# Patient Record
Sex: Female | Born: 1955 | Race: White | Hispanic: No | Marital: Married | Smoking: Former smoker
Health system: Southern US, Community
[De-identification: ages and names within clinical notes are randomized; demographics above are authoritative.]

## PROBLEM LIST (undated history)

## (undated) DIAGNOSIS — M2550 Pain in unspecified joint: Secondary | ICD-10-CM

## (undated) DIAGNOSIS — Q2112 Patent foramen ovale: Secondary | ICD-10-CM

## (undated) DIAGNOSIS — M8580 Other specified disorders of bone density and structure, unspecified site: Secondary | ICD-10-CM

## (undated) DIAGNOSIS — I10 Essential (primary) hypertension: Secondary | ICD-10-CM

## (undated) DIAGNOSIS — R000 Tachycardia, unspecified: Secondary | ICD-10-CM

## (undated) DIAGNOSIS — E785 Hyperlipidemia, unspecified: Secondary | ICD-10-CM

## (undated) DIAGNOSIS — R351 Nocturia: Secondary | ICD-10-CM

## (undated) DIAGNOSIS — I2510 Atherosclerotic heart disease of native coronary artery without angina pectoris: Secondary | ICD-10-CM

## (undated) DIAGNOSIS — M1990 Unspecified osteoarthritis, unspecified site: Secondary | ICD-10-CM

## (undated) DIAGNOSIS — J189 Pneumonia, unspecified organism: Secondary | ICD-10-CM

## (undated) DIAGNOSIS — Q211 Atrial septal defect: Secondary | ICD-10-CM

## (undated) DIAGNOSIS — F419 Anxiety disorder, unspecified: Secondary | ICD-10-CM

## (undated) DIAGNOSIS — Z8709 Personal history of other diseases of the respiratory system: Secondary | ICD-10-CM

## (undated) HISTORY — DX: Hyperlipidemia, unspecified: E78.5

## (undated) HISTORY — PX: BACK SURGERY: SHX140

## (undated) HISTORY — DX: Tachycardia, unspecified: R00.0

## (undated) HISTORY — DX: Atherosclerotic heart disease of native coronary artery without angina pectoris: I25.10

## (undated) HISTORY — PX: COLONOSCOPY: SHX174

## (undated) HISTORY — DX: Atrial septal defect: Q21.1

## (undated) HISTORY — DX: Other specified disorders of bone density and structure, unspecified site: M85.80

## (undated) HISTORY — DX: Patent foramen ovale: Q21.12

## (undated) HISTORY — DX: Anxiety disorder, unspecified: F41.9

---

## 1998-04-19 ENCOUNTER — Inpatient Hospital Stay: Admission: RE | Admit: 1998-04-19 | Discharge: 1998-04-22 | Payer: Self-pay | Admitting: Thoracic Surgery

## 2001-11-30 HISTORY — PX: LUNG SURGERY: SHX703

## 2002-10-31 ENCOUNTER — Encounter: Admission: RE | Admit: 2002-10-31 | Discharge: 2002-10-31 | Payer: Self-pay | Admitting: Orthopedic Surgery

## 2002-10-31 ENCOUNTER — Encounter: Payer: Self-pay | Admitting: Orthopedic Surgery

## 2002-12-11 ENCOUNTER — Encounter: Admission: RE | Admit: 2002-12-11 | Discharge: 2002-12-11 | Payer: Self-pay | Admitting: Orthopedic Surgery

## 2002-12-11 ENCOUNTER — Encounter: Payer: Self-pay | Admitting: Orthopedic Surgery

## 2003-12-31 ENCOUNTER — Other Ambulatory Visit: Admission: RE | Admit: 2003-12-31 | Discharge: 2003-12-31 | Payer: Self-pay | Admitting: *Deleted

## 2004-02-12 ENCOUNTER — Encounter: Admission: RE | Admit: 2004-02-12 | Discharge: 2004-05-12 | Payer: Self-pay | Admitting: *Deleted

## 2005-09-18 ENCOUNTER — Ambulatory Visit (HOSPITAL_COMMUNITY): Admission: RE | Admit: 2005-09-18 | Discharge: 2005-09-19 | Payer: Self-pay | Admitting: Orthopaedic Surgery

## 2008-06-22 ENCOUNTER — Other Ambulatory Visit: Admission: RE | Admit: 2008-06-22 | Discharge: 2008-06-22 | Payer: Self-pay | Admitting: Family Medicine

## 2011-04-17 NOTE — Op Note (Signed)
Brandy Meyer, Brandy Meyer                   ACCOUNT NO.:  [id number]   MEDICAL RECORD NO.:  [id number]          PATIENT TYPE:  OIB   LOCATION:  5007                         FACILITY:  [facility]   PHYSICIAN:  Sharolyn Douglas, M.D.        DATE OF BIRTH:  09-Jul-1956   DATE OF PROCEDURE:  09/18/2005  DATE OF DISCHARGE:  09/19/2005                                 OPERATIVE REPORT   DIAGNOSIS:  C6-7 herniated nucleus pulposus and foraminal narrowing with  severe left upper extremity radiculopathy.   PROCEDURE:  1.  Anterior cervical diskectomy, C6-7.  2.  Anterior cervical arthrodesis, C6-7, with placement of allograft      prosthesis spacer packed with local autogenous bone graft.  3.  Anterior cervical plating, C6-7, using the Abbott Spine System.   SURGEON:  Sharolyn Douglas, M.D.   ASSISTANT:  Verlin Fester, P.A.   ANESTHESIA:  General endotracheal.   COMPLICATIONS:  None.   ESTIMATED BLOOD LOSS:  Minimal.   INDICATIONS:  The patient is a pleasant female with severe left upper  extremity radiculopathy felt to be secondary to C6-7 foraminal disk  herniation with underlying spondylosis and foraminal narrowing.  Because of  the severe nature of her pain unresponsive to conservative management, she  has elected undergo ACDF at C6-7 in hopes of improving her symptoms, risk  and benefits reviewed.   PROCEDURE:  The patient was identified in the holding area and taken to the  operating room where she underwent general orotracheal anesthesia without  difficulty, given prophylactic IV antibiotics.  Carefully positioned with  her neck in slight extension using the Mayfield head rest, 5 pounds of  halter traction applied, heck was prepped and draped usual sterile fashion.  Small transverse incision was made at level of the cricoid cartilage on the  left side.  Dissection was carried sharply through platysma.  The interval  between the SCM and strap muscles medially was developed down to the  prevertebral  space.  The esophagus, trachea and carotid sheath were examined  and protected at all times.  Intraoperative x-ray was taken to confirm  level.  The C6-7 space was easily identifiable by the large anterior  osteophyte.  Caspar distraction pins were placed in the C6 and C7 vertebral  bodies.  The osteophyte was taken down using the high-speed bur.  The disk  space was then entered and a radical diskectomy carried back to the  posterior longitudinal ligament.  The disk itself was very degenerative.  The uncovertebral joints were removed using the high-speed bur along with  the Kerrison punches.  The posterior longitudinal ligament was removed.  We  felt we had a good decompression of the spinal canal and foramen  bilaterally.  We then placed a 7-mm allograft prosthesis spacer which had  been packed with local bone graft obtained from the drill shavings; this was  countersunk into the interspace 1 mm.  A 24-mm Abbott Spine anterior  cervical plate was then placed with  four 12-mm locking screws.  We had  excellent screw purchase.  The bone  quality was good.  The wound was  irrigated and intraoperative x-ray taken, confirming appropriate levels and  placement of the instrumentation.  Deep Penrose drain was left in place.  Hemostasis was achieved.  The platysma was closed with an interrupted 2-0  Vicryl, subcutaneous layer closed with interrupted 3-0 Vicryl followed by a  running 4-0 subcuticular Vicryl suture on the skin edges, Benzoin and Steri-  Strips placed, sterile dressing applied, soft collar placed.  The patient  was extubated without difficulty and transferred to Recovery in stable  condition, able to move her upper and lower extremities.      Sharolyn Douglas, M.D.  Electronically Signed     MC/MEDQ  D:  09/20/2005  T:  09/20/2005  Job:  [id number]

## 2013-10-14 ENCOUNTER — Other Ambulatory Visit: Payer: Self-pay | Admitting: Interventional Cardiology

## 2013-10-18 ENCOUNTER — Other Ambulatory Visit: Payer: Self-pay | Admitting: Interventional Cardiology

## 2013-10-21 ENCOUNTER — Other Ambulatory Visit: Payer: Self-pay | Admitting: Interventional Cardiology

## 2013-10-27 ENCOUNTER — Telehealth: Payer: Self-pay

## 2013-10-27 NOTE — Telephone Encounter (Signed)
patient called about refill on metoprolol it was sent on 11.24.14, I called the pharm and they stated that they did not have it so I gave verbal order

## 2014-04-25 ENCOUNTER — Other Ambulatory Visit: Payer: Self-pay | Admitting: Orthopedic Surgery

## 2014-04-25 DIAGNOSIS — M545 Low back pain, unspecified: Secondary | ICD-10-CM

## 2014-05-04 ENCOUNTER — Ambulatory Visit
Admission: RE | Admit: 2014-05-04 | Discharge: 2014-05-04 | Disposition: A | Discharge status: Home / Self Care | Payer: Federal, State, Local not specified - PPO | Source: Ambulatory Visit | Attending: Orthopedic Surgery | Admitting: Orthopedic Surgery

## 2014-05-04 DIAGNOSIS — M545 Low back pain, unspecified: Secondary | ICD-10-CM

## 2014-05-14 ENCOUNTER — Other Ambulatory Visit: Payer: Self-pay | Admitting: Physical Medicine and Rehabilitation

## 2014-05-14 DIAGNOSIS — M545 Low back pain, unspecified: Secondary | ICD-10-CM

## 2014-05-17 ENCOUNTER — Ambulatory Visit
Admission: RE | Admit: 2014-05-17 | Discharge: 2014-05-17 | Disposition: A | Discharge status: Home / Self Care | Payer: Federal, State, Local not specified - PPO | Source: Ambulatory Visit | Attending: Physical Medicine and Rehabilitation | Admitting: Physical Medicine and Rehabilitation

## 2014-05-17 VITALS — BP 136/81 | HR 104

## 2014-05-17 DIAGNOSIS — M545 Low back pain, unspecified: Secondary | ICD-10-CM

## 2014-05-17 MED ORDER — DIAZEPAM 5 MG PO TABS
10.0000 mg | ORAL_TABLET | Freq: Once | ORAL | Status: AC
  Administered 2014-05-17: 10 mg via ORAL

## 2014-05-17 MED ORDER — IOHEXOL 180 MG/ML  SOLN
1.0000 mL | Freq: Once | INTRAMUSCULAR | Status: AC | PRN
  Administered 2014-05-17: 1 mL via EPIDURAL

## 2014-05-17 MED ORDER — METHYLPREDNISOLONE ACETATE 40 MG/ML INJ SUSP (RADIOLOG
120.0000 mg | Freq: Once | INTRAMUSCULAR | Status: AC
  Administered 2014-05-17: 120 mg via EPIDURAL

## 2014-05-17 NOTE — Discharge Instructions (Signed)

## 2014-06-18 ENCOUNTER — Other Ambulatory Visit: Payer: Self-pay | Admitting: Neurological Surgery

## 2014-06-18 DIAGNOSIS — M48061 Spinal stenosis, lumbar region without neurogenic claudication: Secondary | ICD-10-CM

## 2014-06-19 ENCOUNTER — Ambulatory Visit
Admission: RE | Admit: 2014-06-19 | Discharge: 2014-06-19 | Disposition: A | Discharge status: Home / Self Care | Payer: Federal, State, Local not specified - PPO | Source: Ambulatory Visit | Attending: Neurological Surgery | Admitting: Neurological Surgery

## 2014-06-19 VITALS — BP 142/83 | HR 114

## 2014-06-19 DIAGNOSIS — M48061 Spinal stenosis, lumbar region without neurogenic claudication: Secondary | ICD-10-CM

## 2014-06-19 MED ORDER — IOHEXOL 180 MG/ML  SOLN
1.0000 mL | Freq: Once | INTRAMUSCULAR | Status: AC | PRN
  Administered 2014-06-19: 1 mL via EPIDURAL

## 2014-06-19 MED ORDER — METHYLPREDNISOLONE ACETATE 40 MG/ML INJ SUSP (RADIOLOG
120.0000 mg | Freq: Once | INTRAMUSCULAR | Status: AC
  Administered 2014-06-19: 120 mg via EPIDURAL

## 2014-09-04 ENCOUNTER — Encounter: Payer: Self-pay | Admitting: Cardiology

## 2014-09-04 ENCOUNTER — Encounter: Payer: Self-pay | Admitting: Interventional Cardiology

## 2014-09-04 ENCOUNTER — Ambulatory Visit (INDEPENDENT_AMBULATORY_CARE_PROVIDER_SITE_OTHER): Payer: Federal, State, Local not specified - PPO | Admitting: Interventional Cardiology

## 2014-09-04 VITALS — BP 124/90 | HR 91 | Ht 61.0 in | Wt 133.8 lb

## 2014-09-04 DIAGNOSIS — R000 Tachycardia, unspecified: Secondary | ICD-10-CM

## 2014-09-04 DIAGNOSIS — E785 Hyperlipidemia, unspecified: Secondary | ICD-10-CM | POA: Insufficient documentation

## 2014-09-04 DIAGNOSIS — I471 Supraventricular tachycardia: Secondary | ICD-10-CM

## 2014-09-04 DIAGNOSIS — E782 Mixed hyperlipidemia: Secondary | ICD-10-CM

## 2014-09-04 NOTE — Patient Instructions (Signed)
Your physician recommends that you return for a FASTING lipid and cmet on  Your physician recommends that you continue on your current medications as directed. Please refer to the Current Medication list given to you today.  Your physician wants you to follow-up in: 1 year with Dr. Eldridge DaceVaranasi. You will receive a reminder letter in the mail two months in advance. If you don't receive a letter, please call our office to schedule the follow-up appointment.

## 2014-09-04 NOTE — Progress Notes (Signed)
Patient ID: Brandy Meyer Probus, female   DOB: [birth year], 58 y.o.   MRN: [id number]    [address] [place], [place]  [address] Phone: [phone] Fax:  [phone]  Date:  09/04/2014   ID:  Brandy Meyer [place], DOB [birth year], MRN [id number]  PCP:  No primary provider on file.      History of Present Illness: Brandy Meyer Iodice is a 58 y.o. female who ahd a negaitve cardiac w/u in 2011. SHe had a Holter monitor placed and her average HR was > 100. SHe was placed on a beta blocker several years ago. LV function was normal in the past.   She has been walking some and has had no problems.  BP is high at [employer] but she does not rest before checking.  She had back surgery, no cardiac or BP issues during that time.  She had a cyst removed.      Wt Readings from Last 3 Encounters:  09/04/14 133 lb 12.8 oz (60.691 kg)     Past Medical History  Diagnosis Date  . Hyperlipidemia   . Anxiety   . Osteopenia     by [facility] on January 30, 2009  . H/O exercise stress test     ETT, without ischemia, echocardiogram stable   . Sinus tachycardia     Current Outpatient Prescriptions  Medication Sig Dispense Refill  . atorvastatin (LIPITOR) 40 MG tablet Take 60 mg by mouth daily at 6 PM.       . Cholecalciferol (VITAMIN D-3) 5000 UNITS TABS Take 1 tablet by mouth daily.      Marland Kitchen. EPINEPHrine 0.3 mg/0.3 mL IJ SOAJ injection Inject 0.3 mg into the muscle as needed.      . metoprolol succinate (TOPROL-XL) 25 MG 24 hr tablet 1 TABLET ONCE A DAY ORALLY 30 DAYS  30 tablet  10  . oxyCODONE-acetaminophen (PERCOCET/ROXICET) 5-325 MG per tablet Take 1 tablet by mouth every 8 (eight) hours as needed for severe pain.        No current facility-administered medications for this visit.    Allergies:    Allergies  Allergen Reactions  . Shellfish Allergy Hives, Shortness Of Breath and Swelling    Crab meat and lobster.  Can tolerate small doses of shrimp.   . Effexor [Venlafaxine] Other (See  Comments)    Sleepy/hyper  . Penicillins     Had as a child; doesn't remember    Social History:  The patient  reports that she quit smoking about 6 years ago. She does not have any smokeless tobacco history on file. She reports that she drinks alcohol. She reports that she does not use illicit drugs.   Family History:  The patient's family history includes Alcoholism in her father; Heart attack in her mother; Lung cancer in her father.   ROS:  Please see the history of present illness.  No nausea, vomiting.  No fevers, chills.  No focal weakness.  No dysuria.    All other systems reviewed and negative.   PHYSICAL EXAM: VS:  BP 124/90  Pulse 91  Ht 5\' 1"  (1.549 m)  Wt 133 lb 12.8 oz (60.691 kg)  BMI 25.29 kg/m2 Well nourished, well developed, in no acute distress HEENT: normal Neck: no JVD, no carotid bruits Cardiac:  normal S1, S2; RRR;  Lungs:  clear to auscultation bilaterally, no wheezing, rhonchi or rales Abd: soft, nontender, no hepatomegaly Ext: no edema Skin: warm and dry Neuro:  no focal abnormalities noted  EKG:  Normal   ASSESSMENT AND PLAN:  Sinus Tachycardia  Continue Toprol XL Tablet Extended Release 24 Hour, 25 MG, 1 tablet, Orally, Once a day Notes: No palpitations. HR better on exam. HR was averaing 104 on the last Holter. Check HR at home or at drug store.  Explained risk of uncontrolled tachycardia causing cardiomyopathy. She is feeling better.  Pulse rate, (heart rate) should be < 100.    2. Hyperlipemia, mixed  Continue Atorvastatin Calcium Tablet, 40 MG, 1 1/2 tabs (60 MG total), Orally, Once a day Notes: TG 305, LDL 124, both have decreased on the 5/14 check. No recent lab work.  Will check lipids and liver function tests. Will forward to Dr. Zachery Dauer.   May need lipitor 80. Consider fish oil if TG stay elevated.    Follow Up  1 Year (Reason: tachycardia)     Signed, Fredric Mare, MD, [facility] 09/04/2014 3:01 PM

## 2014-09-05 ENCOUNTER — Other Ambulatory Visit (INDEPENDENT_AMBULATORY_CARE_PROVIDER_SITE_OTHER): Payer: Federal, State, Local not specified - PPO | Admitting: *Deleted

## 2014-09-05 DIAGNOSIS — E782 Mixed hyperlipidemia: Secondary | ICD-10-CM

## 2014-09-05 LAB — LDL CHOLESTEROL, DIRECT: Direct LDL: 127 mg/dL

## 2014-09-05 LAB — COMPREHENSIVE METABOLIC PANEL
ALK PHOS: 67 U/L (ref 39–117)
ALT: 19 U/L (ref 0–35)
AST: 12 U/L (ref 0–37)
Albumin: 3.6 g/dL (ref 3.5–5.2)
BILIRUBIN TOTAL: 1.2 mg/dL (ref 0.2–1.2)
BUN: 9 mg/dL (ref 6–23)
CO2: 28 mEq/L (ref 19–32)
Calcium: 9 mg/dL (ref 8.4–10.5)
Chloride: 102 mEq/L (ref 96–112)
Creatinine, Ser: 0.6 mg/dL (ref 0.4–1.2)
GFR: 104.91 mL/min (ref >60.00)
GLUCOSE: 95 mg/dL (ref 70–99)
Potassium: 4.3 mEq/L (ref 3.5–5.1)
SODIUM: 137 meq/L (ref 135–145)
Total Protein: 7.6 g/dL (ref 6.0–8.3)

## 2014-09-05 LAB — LIPID PANEL
CHOL/HDL RATIO: 5
CHOLESTEROL: 205 mg/dL — AB (ref 0–200)
HDL: 41.2 mg/dL (ref >39.00)
NonHDL: 163.8
Triglycerides: 244 mg/dL — ABNORMAL HIGH (ref 0.0–149.0)
VLDL: 48.8 mg/dL — AB (ref 0.0–40.0)

## 2014-10-18 ENCOUNTER — Encounter: Payer: Self-pay | Admitting: *Deleted

## 2014-10-22 ENCOUNTER — Other Ambulatory Visit: Payer: Self-pay | Admitting: Interventional Cardiology

## 2015-08-15 ENCOUNTER — Other Ambulatory Visit: Payer: Self-pay | Admitting: Family Medicine

## 2015-08-15 ENCOUNTER — Other Ambulatory Visit (HOSPITAL_COMMUNITY)
Admission: RE | Admit: 2015-08-15 | Discharge: 2015-08-15 | Disposition: A | Discharge status: Home / Self Care | Payer: Federal, State, Local not specified - PPO | Source: Ambulatory Visit | Attending: Family Medicine | Admitting: Family Medicine

## 2015-08-15 DIAGNOSIS — Z124 Encounter for screening for malignant neoplasm of cervix: Secondary | ICD-10-CM | POA: Diagnosis present

## 2015-08-16 LAB — CYTOLOGY - PAP

## 2015-09-16 ENCOUNTER — Other Ambulatory Visit: Payer: Self-pay | Admitting: Neurological Surgery

## 2015-09-16 DIAGNOSIS — M5416 Radiculopathy, lumbar region: Secondary | ICD-10-CM

## 2015-09-20 ENCOUNTER — Ambulatory Visit
Admission: RE | Admit: 2015-09-20 | Discharge: 2015-09-20 | Disposition: A | Discharge status: Home / Self Care | Payer: Federal, State, Local not specified - PPO | Source: Ambulatory Visit | Attending: Neurological Surgery | Admitting: Neurological Surgery

## 2015-09-20 DIAGNOSIS — M5416 Radiculopathy, lumbar region: Secondary | ICD-10-CM

## 2015-09-20 MED ORDER — GADOBENATE DIMEGLUMINE 529 MG/ML IV SOLN: 12.0000 mL | Freq: Once | INTRAVENOUS | Status: DC | PRN

## 2015-09-23 ENCOUNTER — Other Ambulatory Visit (HOSPITAL_COMMUNITY): Payer: Self-pay | Admitting: Neurological Surgery

## 2015-09-24 ENCOUNTER — Other Ambulatory Visit: Payer: Self-pay | Admitting: Neurological Surgery

## 2015-09-24 DIAGNOSIS — M5416 Radiculopathy, lumbar region: Secondary | ICD-10-CM

## 2015-09-27 ENCOUNTER — Ambulatory Visit
Admission: RE | Admit: 2015-09-27 | Discharge: 2015-09-27 | Disposition: A | Discharge status: Home / Self Care | Payer: Federal, State, Local not specified - PPO | Source: Ambulatory Visit | Attending: Neurological Surgery | Admitting: Neurological Surgery

## 2015-09-27 DIAGNOSIS — M5416 Radiculopathy, lumbar region: Secondary | ICD-10-CM

## 2015-09-27 MED ORDER — METHYLPREDNISOLONE ACETATE 40 MG/ML INJ SUSP (RADIOLOG
120.0000 mg | Freq: Once | INTRAMUSCULAR | Status: AC
  Administered 2015-09-27: 120 mg via EPIDURAL

## 2015-09-27 MED ORDER — IOHEXOL 180 MG/ML  SOLN
1.0000 mL | Freq: Once | INTRAMUSCULAR | Status: DC | PRN
  Administered 2015-09-27: 1 mL via EPIDURAL

## 2015-09-27 NOTE — Discharge Instructions (Signed)

## 2015-10-07 ENCOUNTER — Encounter (HOSPITAL_COMMUNITY)
Admission: RE | Admit: 2015-10-07 | Discharge: 2015-10-07 | Disposition: A | Discharge status: Home / Self Care | Payer: Federal, State, Local not specified - PPO | Source: Ambulatory Visit | Attending: Neurological Surgery | Admitting: Neurological Surgery

## 2015-10-07 ENCOUNTER — Ambulatory Visit (HOSPITAL_COMMUNITY)
Admission: RE | Admit: 2015-10-07 | Discharge: 2015-10-07 | Disposition: A | Discharge status: Home / Self Care | Payer: Federal, State, Local not specified - PPO | Source: Ambulatory Visit | Attending: Neurological Surgery | Admitting: Neurological Surgery

## 2015-10-07 ENCOUNTER — Encounter (HOSPITAL_COMMUNITY): Payer: Self-pay

## 2015-10-07 DIAGNOSIS — I10 Essential (primary) hypertension: Secondary | ICD-10-CM | POA: Insufficient documentation

## 2015-10-07 DIAGNOSIS — Z9889 Other specified postprocedural states: Secondary | ICD-10-CM | POA: Insufficient documentation

## 2015-10-07 DIAGNOSIS — Z01818 Encounter for other preprocedural examination: Secondary | ICD-10-CM | POA: Diagnosis present

## 2015-10-07 DIAGNOSIS — F419 Anxiety disorder, unspecified: Secondary | ICD-10-CM | POA: Insufficient documentation

## 2015-10-07 DIAGNOSIS — Z87891 Personal history of nicotine dependence: Secondary | ICD-10-CM | POA: Diagnosis not present

## 2015-10-07 DIAGNOSIS — E785 Hyperlipidemia, unspecified: Secondary | ICD-10-CM | POA: Insufficient documentation

## 2015-10-07 DIAGNOSIS — Z01812 Encounter for preprocedural laboratory examination: Secondary | ICD-10-CM | POA: Insufficient documentation

## 2015-10-07 DIAGNOSIS — M48061 Spinal stenosis, lumbar region without neurogenic claudication: Secondary | ICD-10-CM

## 2015-10-07 DIAGNOSIS — Z01810 Encounter for preprocedural cardiovascular examination: Secondary | ICD-10-CM | POA: Diagnosis not present

## 2015-10-07 HISTORY — DX: Essential (primary) hypertension: I10

## 2015-10-07 LAB — BASIC METABOLIC PANEL
ANION GAP: 11 (ref 5–15)
BUN: 11 mg/dL (ref 6–20)
CO2: 24 mmol/L (ref 22–32)
Calcium: 9 mg/dL (ref 8.9–10.3)
Chloride: 99 mmol/L — ABNORMAL LOW (ref 101–111)
Creatinine, Ser: 0.57 mg/dL (ref 0.44–1.00)
GFR calc Af Amer: >60 mL/min (ref >60)
GFR calc non Af Amer: >60 mL/min (ref >60)
GLUCOSE: 101 mg/dL — AB (ref 65–99)
POTASSIUM: 4.5 mmol/L (ref 3.5–5.1)
Sodium: 134 mmol/L — ABNORMAL LOW (ref 135–145)

## 2015-10-07 LAB — CBC WITH DIFFERENTIAL/PLATELET
BASOS ABS: 0 10*3/uL (ref 0.0–0.1)
Basophils Relative: 0 %
Eosinophils Absolute: 0.1 10*3/uL (ref 0.0–0.7)
Eosinophils Relative: 1 %
HEMATOCRIT: 40.4 % (ref 36.0–46.0)
Hemoglobin: 13.7 g/dL (ref 12.0–15.0)
LYMPHS PCT: 27 %
Lymphs Abs: 2.3 10*3/uL (ref 0.7–4.0)
MCH: 29.6 pg (ref 26.0–34.0)
MCHC: 33.9 g/dL (ref 30.0–36.0)
MCV: 87.3 fL (ref 78.0–100.0)
MONO ABS: 0.6 10*3/uL (ref 0.1–1.0)
MONOS PCT: 7 %
NEUTROS ABS: 5.7 10*3/uL (ref 1.7–7.7)
Neutrophils Relative %: 65 %
Platelets: 405 10*3/uL — ABNORMAL HIGH (ref 150–400)
RBC: 4.63 MIL/uL (ref 3.87–5.11)
RDW: 13 % (ref 11.5–15.5)
WBC: 8.8 10*3/uL (ref 4.0–10.5)

## 2015-10-07 LAB — PROTIME-INR
INR: 0.97 (ref 0.00–1.49)
Prothrombin Time: 13.1 seconds (ref 11.6–15.2)

## 2015-10-07 LAB — ABO/RH: ABO/RH(D): A POS

## 2015-10-07 LAB — SURGICAL PCR SCREEN
MRSA, PCR: NEGATIVE
Staphylococcus aureus: POSITIVE — AB

## 2015-10-07 LAB — TYPE AND SCREEN
ABO/RH(D): A POS
Antibody Screen: NEGATIVE

## 2015-10-07 NOTE — Progress Notes (Addendum)
PCP: Dr. Juluis RainierElizabeth  [facility] on [address]  Cardiologist: Dr. Earley AbideVarinesse: last visit 08/2014, denies any chest pain, or sob.  Mupirocin Ointment called to [facility] [place],

## 2015-10-07 NOTE — Pre-Procedure Instructions (Signed)
    Brandy FloorLisa A Pence  10/07/2015      [facility] - [place], [place] - [address] [place] [address] Phone: [phone] Fax: [phone]    Your procedure is scheduled on Friday, Nov. 11  [facility] Admitting at 7:30 A.M.  Call this number if you have problems the morning of surgery:  [phone]              Any questions prior to surgery call [phone], Monday-Friday 8 AM-4PM   Remember:  Do not eat food or drink liquids after midnight Thursday Nov. 10  Take these medicines the morning of surgery with A SIP OF WATER:oxycodone if needed   Do not wear jewelry, make-up or nail polish.  Do not wear lotions, powders, or perfumes.  You may wear deodorant.  Do not shave 48 hours prior to surgery.  Men may shave face and neck.  Do not bring valuables to the hospital.  [facility] is not responsible for any belongings or valuables.  Contacts, dentures or bridgework may not be worn into surgery.  Leave your suitcase in the car.  After surgery it may be brought to your room.  For patients admitted to the hospital, discharge time will be determined by your treatment team.  Patients discharged the day of surgery will not be allowed to drive home.   Name and phone number of your driver:    Special instructions:   Review preparing for surgery  Please read over the following fact sheets that you were given. Pain Booklet, Coughing and Deep Breathing, Blood Transfusion Information, MRSA Information and Surgical Site Infection Prevention

## 2015-10-08 NOTE — Progress Notes (Signed)
Anesthesia Chart Review: Patient is a 59 year old female scheduled for extraforaminal decompression L4-5 left, PLIF, L4-5 on 10/11/15 by Dr. Yetta BarreJones.  History includes former smoker, HLD, anxiety, HTN, sinus tachycardia, back surgery '15, lung surgery (for benign nodule) '03, C6-7 ACDF '06. PCP is Dr. Juluis RainierElizabeth Barnes. Cardiologist is Dr. Eldridge DaceVaranasi, last visit 09/04/14 (Last note in Epic, with previous cardiology tests scanned under Media tab.)  Meds include Lipitor, Toprol, oxycodone.   PAT Vitals: BP 141/74, HR 82, RR 20, T 36.8C, 95% RA.  10/07/15 EKG: NSR (confirmed by Dr. Lewayne BuntingGregg Taylor). New negative T wave in III and V2 when compared to 10/05/14 EKG.  09/2012 48 hour Holter monitor showed average HR at 104 bpm. (Started on b-blocker therapy.)  10/29/10 Echo: LVEF 60-65%. Impaired relaxation (grade I).   10/29/10 ETT: THR achieved. Baseline EKG showed ST at 109 bpm. During exercise, no ectopy or EKG changes suggestive of ischemia. Normal BP response.  10/07/15 CXR: IMPRESSION: Left lung postsurgical change with scarring. No acute abnormality.  Preoperative labs noted.   HR was controlled at PAT. She remains on B-blocker therapy. If no acute changes then I would anticipate that she could proceed as planned.  Velna Ochsllison Adriane Guglielmo, PA-C [facility]/Anesthesiology Phone [phone] 10/08/2015 10:25 AM

## 2015-10-10 MED ORDER — VANCOMYCIN HCL IN DEXTROSE 1-5 GM/200ML-% IV SOLN
1000.0000 mg | INTRAVENOUS | Status: AC
Start: 2015-10-11 — End: 2015-10-11
  Administered 2015-10-11: 1000 mg via INTRAVENOUS
  Filled 2015-10-10: qty 200

## 2015-10-10 MED ORDER — DEXAMETHASONE SODIUM PHOSPHATE 10 MG/ML IJ SOLN
10.0000 mg | INTRAMUSCULAR | Status: DC
  Filled 2015-10-10: qty 1

## 2015-10-11 ENCOUNTER — Encounter (HOSPITAL_COMMUNITY)
Admission: RE | Disposition: A | Discharge status: Home / Self Care | Payer: Self-pay | Source: Ambulatory Visit | Attending: Neurological Surgery

## 2015-10-11 ENCOUNTER — Inpatient Hospital Stay (HOSPITAL_COMMUNITY)
Admission: RE | Admit: 2015-10-11 | Discharge: 2015-10-12 | DRG: 460 | Disposition: A | Discharge status: Home / Self Care | Payer: Federal, State, Local not specified - PPO | Source: Ambulatory Visit | Attending: Neurological Surgery | Admitting: Neurological Surgery

## 2015-10-11 ENCOUNTER — Inpatient Hospital Stay (HOSPITAL_COMMUNITY): Payer: Federal, State, Local not specified - PPO | Admitting: Vascular Surgery

## 2015-10-11 ENCOUNTER — Inpatient Hospital Stay (HOSPITAL_COMMUNITY): Payer: Federal, State, Local not specified - PPO

## 2015-10-11 ENCOUNTER — Encounter (HOSPITAL_COMMUNITY): Payer: Self-pay | Admitting: *Deleted

## 2015-10-11 ENCOUNTER — Inpatient Hospital Stay (HOSPITAL_COMMUNITY): Payer: Federal, State, Local not specified - PPO | Admitting: Anesthesiology

## 2015-10-11 DIAGNOSIS — I10 Essential (primary) hypertension: Secondary | ICD-10-CM | POA: Diagnosis present

## 2015-10-11 DIAGNOSIS — M4806 Spinal stenosis, lumbar region: Secondary | ICD-10-CM | POA: Diagnosis present

## 2015-10-11 DIAGNOSIS — M79605 Pain in left leg: Secondary | ICD-10-CM | POA: Diagnosis present

## 2015-10-11 DIAGNOSIS — E785 Hyperlipidemia, unspecified: Secondary | ICD-10-CM | POA: Diagnosis present

## 2015-10-11 DIAGNOSIS — Z87891 Personal history of nicotine dependence: Secondary | ICD-10-CM

## 2015-10-11 DIAGNOSIS — M5116 Intervertebral disc disorders with radiculopathy, lumbar region: Secondary | ICD-10-CM | POA: Diagnosis present

## 2015-10-11 DIAGNOSIS — M549 Dorsalgia, unspecified: Secondary | ICD-10-CM

## 2015-10-11 DIAGNOSIS — Z981 Arthrodesis status: Secondary | ICD-10-CM

## 2015-10-11 DIAGNOSIS — M532X6 Spinal instabilities, lumbar region: Secondary | ICD-10-CM | POA: Diagnosis present

## 2015-10-11 DIAGNOSIS — M8580 Other specified disorders of bone density and structure, unspecified site: Secondary | ICD-10-CM | POA: Diagnosis present

## 2015-10-11 SURGERY — POSTERIOR LUMBAR FUSION 1 LEVEL
Anesthesia: General | Site: Back

## 2015-10-11 MED ORDER — SODIUM CHLORIDE 0.9 % IJ SOLN: 3.0000 mL | INTRAMUSCULAR | Status: DC | PRN

## 2015-10-11 MED ORDER — GLYCOPYRROLATE 0.2 MG/ML IJ SOLN
INTRAMUSCULAR | Status: AC
  Filled 2015-10-11: qty 3

## 2015-10-11 MED ORDER — OXYCODONE HCL 5 MG PO TABS
5.0000 mg | ORAL_TABLET | Freq: Once | ORAL | Status: AC
  Administered 2015-10-11: 5 mg via ORAL

## 2015-10-11 MED ORDER — PROPOFOL 10 MG/ML IV BOLUS
INTRAVENOUS | Status: AC
  Filled 2015-10-11: qty 20

## 2015-10-11 MED ORDER — SUCCINYLCHOLINE CHLORIDE 20 MG/ML IJ SOLN
INTRAMUSCULAR | Status: AC
  Filled 2015-10-11: qty 1

## 2015-10-11 MED ORDER — ACETAMINOPHEN 325 MG PO TABS: 650.0000 mg | ORAL_TABLET | ORAL | Status: DC | PRN

## 2015-10-11 MED ORDER — OXYCODONE HCL 5 MG PO TABS
10.0000 mg | ORAL_TABLET | ORAL | Status: DC | PRN
  Administered 2015-10-11 – 2015-10-12 (×4): 10 mg via ORAL
  Filled 2015-10-11 (×8): qty 2

## 2015-10-11 MED ORDER — PHENYLEPHRINE 40 MCG/ML (10ML) SYRINGE FOR IV PUSH (FOR BLOOD PRESSURE SUPPORT)
PREFILLED_SYRINGE | INTRAVENOUS | Status: AC
  Filled 2015-10-11: qty 20

## 2015-10-11 MED ORDER — ARTIFICIAL TEARS OP OINT
TOPICAL_OINTMENT | OPHTHALMIC | Status: AC
  Filled 2015-10-11: qty 3.5

## 2015-10-11 MED ORDER — MORPHINE SULFATE (PF) 2 MG/ML IV SOLN
1.0000 mg | INTRAVENOUS | Status: DC | PRN
  Administered 2015-10-11: 2 mg via INTRAVENOUS
  Filled 2015-10-11: qty 1

## 2015-10-11 MED ORDER — VANCOMYCIN HCL 1000 MG IV SOLR
INTRAVENOUS | Status: DC | PRN
  Administered 2015-10-11: 1000 mg

## 2015-10-11 MED ORDER — VANCOMYCIN HCL IN DEXTROSE 1-5 GM/200ML-% IV SOLN
1000.0000 mg | INTRAVENOUS | Status: DC
  Filled 2015-10-11: qty 200

## 2015-10-11 MED ORDER — PHENYLEPHRINE HCL 10 MG/ML IJ SOLN
INTRAMUSCULAR | Status: DC | PRN
  Administered 2015-10-11 (×2): 40 ug via INTRAVENOUS

## 2015-10-11 MED ORDER — ACETAMINOPHEN 650 MG RE SUPP: 650.0000 mg | RECTAL | Status: DC | PRN

## 2015-10-11 MED ORDER — LIDOCAINE HCL (CARDIAC) 20 MG/ML IV SOLN
INTRAVENOUS | Status: DC | PRN
  Administered 2015-10-11: 70 mg via INTRAVENOUS

## 2015-10-11 MED ORDER — ONDANSETRON HCL 4 MG/2ML IJ SOLN
INTRAMUSCULAR | Status: DC | PRN
  Administered 2015-10-11: 4 mg via INTRAVENOUS

## 2015-10-11 MED ORDER — POTASSIUM CHLORIDE IN NACL 20-0.9 MEQ/L-% IV SOLN
INTRAVENOUS | Status: DC
  Administered 2015-10-11: 16:00:00 via INTRAVENOUS
  Filled 2015-10-11 (×3): qty 1000

## 2015-10-11 MED ORDER — SUCCINYLCHOLINE CHLORIDE 20 MG/ML IJ SOLN
INTRAMUSCULAR | Status: DC | PRN
  Administered 2015-10-11: 70 mg via INTRAVENOUS

## 2015-10-11 MED ORDER — HYDROMORPHONE HCL 1 MG/ML IJ SOLN
INTRAMUSCULAR | Status: AC
  Filled 2015-10-11: qty 1

## 2015-10-11 MED ORDER — PROPOFOL 10 MG/ML IV BOLUS
INTRAVENOUS | Status: AC
  Filled 2015-10-11: qty 40

## 2015-10-11 MED ORDER — VANCOMYCIN HCL 1000 MG IV SOLR
INTRAVENOUS | Status: AC
  Filled 2015-10-11: qty 1000

## 2015-10-11 MED ORDER — CELECOXIB 200 MG PO CAPS
200.0000 mg | ORAL_CAPSULE | Freq: Two times a day (BID) | ORAL | Status: DC
  Administered 2015-10-11 – 2015-10-12 (×3): 200 mg via ORAL
  Filled 2015-10-11 (×3): qty 1

## 2015-10-11 MED ORDER — MIDAZOLAM HCL 5 MG/5ML IJ SOLN
INTRAMUSCULAR | Status: DC | PRN
Start: 2015-10-11 — End: 2015-10-11
  Administered 2015-10-11: 2 mg via INTRAVENOUS

## 2015-10-11 MED ORDER — 0.9 % SODIUM CHLORIDE (POUR BTL) OPTIME
TOPICAL | Status: DC | PRN
  Administered 2015-10-11: 1000 mL

## 2015-10-11 MED ORDER — MIDAZOLAM HCL 2 MG/2ML IJ SOLN
INTRAMUSCULAR | Status: AC
  Filled 2015-10-11: qty 2

## 2015-10-11 MED ORDER — METHOCARBAMOL 500 MG PO TABS
500.0000 mg | ORAL_TABLET | Freq: Four times a day (QID) | ORAL | Status: DC | PRN
  Administered 2015-10-11 – 2015-10-12 (×3): 500 mg via ORAL
  Filled 2015-10-11 (×3): qty 1

## 2015-10-11 MED ORDER — STERILE WATER FOR INJECTION IJ SOLN
INTRAMUSCULAR | Status: AC
  Filled 2015-10-11: qty 20

## 2015-10-11 MED ORDER — LIDOCAINE HCL (CARDIAC) 20 MG/ML IV SOLN
INTRAVENOUS | Status: AC
  Filled 2015-10-11: qty 10

## 2015-10-11 MED ORDER — ROCURONIUM BROMIDE 50 MG/5ML IV SOLN
INTRAVENOUS | Status: AC
  Filled 2015-10-11: qty 2

## 2015-10-11 MED ORDER — OXYCODONE HCL 5 MG/5ML PO SOLN: 5.0000 mg | Freq: Once | ORAL | Status: AC | PRN

## 2015-10-11 MED ORDER — SODIUM CHLORIDE 0.9 % IJ SOLN
3.0000 mL | Freq: Two times a day (BID) | INTRAMUSCULAR | Status: DC
  Administered 2015-10-11: 3 mL via INTRAVENOUS

## 2015-10-11 MED ORDER — LACTATED RINGERS IV SOLN
INTRAVENOUS | Status: DC | PRN
  Administered 2015-10-11 (×2): via INTRAVENOUS

## 2015-10-11 MED ORDER — ACETAMINOPHEN 160 MG/5ML PO SOLN: 325.0000 mg | ORAL | Status: DC | PRN

## 2015-10-11 MED ORDER — OXYCODONE HCL 5 MG PO TABS
ORAL_TABLET | ORAL | Status: AC
Start: 2015-10-11 — End: 2015-10-12
  Filled 2015-10-11: qty 2

## 2015-10-11 MED ORDER — PHENYLEPHRINE HCL 10 MG/ML IJ SOLN
INTRAMUSCULAR | Status: AC
  Filled 2015-10-11: qty 1

## 2015-10-11 MED ORDER — MENTHOL 3 MG MT LOZG: 1.0000 | LOZENGE | OROMUCOSAL | Status: DC | PRN

## 2015-10-11 MED ORDER — LIDOCAINE HCL (CARDIAC) 20 MG/ML IV SOLN
INTRAVENOUS | Status: AC
  Filled 2015-10-11: qty 15

## 2015-10-11 MED ORDER — MUPIROCIN 2 % EX OINT
TOPICAL_OINTMENT | CUTANEOUS | Status: AC
  Filled 2015-10-11: qty 22

## 2015-10-11 MED ORDER — LACTATED RINGERS IV SOLN
INTRAVENOUS | Status: DC
  Administered 2015-10-11: 08:00:00 via INTRAVENOUS

## 2015-10-11 MED ORDER — ONDANSETRON HCL 4 MG/2ML IJ SOLN: 4.0000 mg | INTRAMUSCULAR | Status: DC | PRN

## 2015-10-11 MED ORDER — ONDANSETRON HCL 4 MG/2ML IJ SOLN
INTRAMUSCULAR | Status: AC
  Filled 2015-10-11: qty 2

## 2015-10-11 MED ORDER — SODIUM CHLORIDE 0.9 % IJ SOLN
INTRAMUSCULAR | Status: AC
  Filled 2015-10-11: qty 10

## 2015-10-11 MED ORDER — DEXTROSE 5 % IV SOLN
500.0000 mg | Freq: Four times a day (QID) | INTRAVENOUS | Status: DC | PRN
  Filled 2015-10-11: qty 5

## 2015-10-11 MED ORDER — PROPOFOL 10 MG/ML IV BOLUS
INTRAVENOUS | Status: DC | PRN
  Administered 2015-10-11: 150 mg via INTRAVENOUS

## 2015-10-11 MED ORDER — FENTANYL CITRATE (PF) 250 MCG/5ML IJ SOLN
INTRAMUSCULAR | Status: AC
  Filled 2015-10-11: qty 5

## 2015-10-11 MED ORDER — MUPIROCIN 2 % EX OINT
1.0000 "application " | TOPICAL_OINTMENT | Freq: Two times a day (BID) | CUTANEOUS | Status: DC
  Administered 2015-10-11: 1 via TOPICAL

## 2015-10-11 MED ORDER — FENTANYL CITRATE (PF) 100 MCG/2ML IJ SOLN
INTRAMUSCULAR | Status: DC | PRN
  Administered 2015-10-11 (×2): 100 ug via INTRAVENOUS
  Administered 2015-10-11: 50 ug via INTRAVENOUS

## 2015-10-11 MED ORDER — BUPIVACAINE HCL (PF) 0.25 % IJ SOLN
INTRAMUSCULAR | Status: DC | PRN
  Administered 2015-10-11: 6 mL

## 2015-10-11 MED ORDER — METOPROLOL SUCCINATE ER 25 MG PO TB24
25.0000 mg | ORAL_TABLET | Freq: Every day | ORAL | Status: DC
  Administered 2015-10-11 – 2015-10-12 (×2): 25 mg via ORAL
  Filled 2015-10-11 (×2): qty 1

## 2015-10-11 MED ORDER — EPHEDRINE SULFATE 50 MG/ML IJ SOLN
INTRAMUSCULAR | Status: AC
  Filled 2015-10-11: qty 1

## 2015-10-11 MED ORDER — MIDAZOLAM HCL 2 MG/2ML IJ SOLN
INTRAMUSCULAR | Status: AC
  Filled 2015-10-11: qty 4

## 2015-10-11 MED ORDER — ACETAMINOPHEN 325 MG PO TABS: 325.0000 mg | ORAL_TABLET | ORAL | Status: DC | PRN

## 2015-10-11 MED ORDER — ONDANSETRON HCL 4 MG/2ML IJ SOLN
INTRAMUSCULAR | Status: AC
  Filled 2015-10-11: qty 4

## 2015-10-11 MED ORDER — ETOMIDATE 2 MG/ML IV SOLN
INTRAVENOUS | Status: AC
  Filled 2015-10-11: qty 20

## 2015-10-11 MED ORDER — PHENOL 1.4 % MT LIQD: 1.0000 | OROMUCOSAL | Status: DC | PRN

## 2015-10-11 MED ORDER — HYDROMORPHONE HCL 1 MG/ML IJ SOLN
0.5000 mg | INTRAMUSCULAR | Status: DC | PRN
  Administered 2015-10-11: 0.5 mg via INTRAVENOUS

## 2015-10-11 MED ORDER — SUCCINYLCHOLINE CHLORIDE 20 MG/ML IJ SOLN
INTRAMUSCULAR | Status: AC
  Filled 2015-10-11: qty 3

## 2015-10-11 MED ORDER — HYDROMORPHONE HCL 1 MG/ML IJ SOLN
0.2500 mg | INTRAMUSCULAR | Status: DC | PRN
  Administered 2015-10-11 (×4): 0.5 mg via INTRAVENOUS

## 2015-10-11 MED ORDER — THROMBIN 5000 UNITS EX SOLR
OROMUCOSAL | Status: DC | PRN
  Administered 2015-10-11: 10 mL via TOPICAL

## 2015-10-11 MED ORDER — THROMBIN 20000 UNITS EX SOLR
CUTANEOUS | Status: DC | PRN
  Administered 2015-10-11: 20 mL via TOPICAL

## 2015-10-11 MED ORDER — SODIUM CHLORIDE 0.9 % IR SOLN
Status: DC | PRN
  Administered 2015-10-11: 500 mL

## 2015-10-11 MED ORDER — SODIUM CHLORIDE 0.9 % IV SOLN: 250.0000 mL | INTRAVENOUS | Status: DC

## 2015-10-11 MED ORDER — ROCURONIUM BROMIDE 50 MG/5ML IV SOLN
INTRAVENOUS | Status: AC
  Filled 2015-10-11: qty 1

## 2015-10-11 MED ORDER — OXYCODONE HCL 5 MG PO TABS
5.0000 mg | ORAL_TABLET | Freq: Once | ORAL | Status: AC | PRN
  Administered 2015-10-11: 5 mg via ORAL

## 2015-10-11 MED ORDER — EPHEDRINE SULFATE 50 MG/ML IJ SOLN
INTRAMUSCULAR | Status: AC
  Filled 2015-10-11: qty 2

## 2015-10-11 MED ORDER — SENNA 8.6 MG PO TABS
1.0000 | ORAL_TABLET | Freq: Two times a day (BID) | ORAL | Status: DC
  Administered 2015-10-11 – 2015-10-12 (×3): 8.6 mg via ORAL
  Filled 2015-10-11 (×3): qty 1

## 2015-10-11 SURGICAL SUPPLY — 61 items
BAG DECANTER FOR FLEXI CONT (MISCELLANEOUS) ×3 IMPLANT
BENZOIN TINCTURE PRP APPL 2/3 (GAUZE/BANDAGES/DRESSINGS) ×3 IMPLANT
BLADE CLIPPER SURG (BLADE) IMPLANT
BONE CANC CHIPS 20CC PCAN1/4 (Bone Implant) ×3 IMPLANT
BUR MATCHSTICK NEURO 3.0 LAGG (BURR) ×3 IMPLANT
CANISTER SUCT 3000ML PPV (MISCELLANEOUS) ×3 IMPLANT
CHIPS CANC BONE 20CC PCAN1/4 (Bone Implant) ×1 IMPLANT
CLIP NEUROVISION LG (CLIP) ×3 IMPLANT
CLOSURE WOUND 1/2 X4 (GAUZE/BANDAGES/DRESSINGS) ×2
CONT SPEC 4OZ CLIKSEAL STRL BL (MISCELLANEOUS) ×3 IMPLANT
COVER BACK TABLE 60X90IN (DRAPES) ×3 IMPLANT
DRAPE C-ARM 42X72 X-RAY (DRAPES) ×6 IMPLANT
DRAPE LAPAROTOMY 100X72X124 (DRAPES) ×3 IMPLANT
DRAPE POUCH INSTRU U-SHP 10X18 (DRAPES) ×3 IMPLANT
DRAPE SURG 17X23 STRL (DRAPES) ×3 IMPLANT
DRSG OPSITE POSTOP 4X6 (GAUZE/BANDAGES/DRESSINGS) ×3 IMPLANT
DURAPREP 26ML APPLICATOR (WOUND CARE) ×3 IMPLANT
ELECT REM PT RETURN 9FT ADLT (ELECTROSURGICAL) ×3
ELECTRODE REM PT RTRN 9FT ADLT (ELECTROSURGICAL) ×1 IMPLANT
EVACUATOR 1/8 PVC DRAIN (DRAIN) ×3 IMPLANT
GAUZE SPONGE 4X4 16PLY XRAY LF (GAUZE/BANDAGES/DRESSINGS) IMPLANT
GLOVE BIO SURGEON STRL SZ8 (GLOVE) ×6 IMPLANT
GOWN STRL REUS W/ TWL LRG LVL3 (GOWN DISPOSABLE) IMPLANT
GOWN STRL REUS W/ TWL XL LVL3 (GOWN DISPOSABLE) ×2 IMPLANT
GOWN STRL REUS W/TWL 2XL LVL3 (GOWN DISPOSABLE) IMPLANT
GOWN STRL REUS W/TWL LRG LVL3 (GOWN DISPOSABLE)
GOWN STRL REUS W/TWL XL LVL3 (GOWN DISPOSABLE) ×4
HEMOSTAT POWDER KIT SURGIFOAM (HEMOSTASIS) IMPLANT
KIT BASIN OR (CUSTOM PROCEDURE TRAY) ×3 IMPLANT
KIT NEEDLE NVM5 EMG ELECT (KITS) ×1 IMPLANT
KIT NEEDLE NVM5 EMG ELECTRODE (KITS) ×2
KIT ROOM TURNOVER OR (KITS) ×3 IMPLANT
NEEDLE HYPO 25X1 1.5 SAFETY (NEEDLE) ×3 IMPLANT
NS IRRIG 1000ML POUR BTL (IV SOLUTION) ×3 IMPLANT
PACK LAMINECTOMY NEURO (CUSTOM PROCEDURE TRAY) ×3 IMPLANT
PAD ARMBOARD 7.5X6 YLW CONV (MISCELLANEOUS) ×9 IMPLANT
PUTTY DBM PROPEL SM (Putty) ×3 IMPLANT
ROD 30MM (Rod) ×2 IMPLANT
ROD 35MM (Rod) ×3 IMPLANT
ROD PLIF MAS PB SPHERX 30 (Rod) ×1 IMPLANT
SCREW LOCK (Screw) ×8 IMPLANT
SCREW LOCK FXNS SPNE MAS PL (Screw) ×4 IMPLANT
SCREW MAS PLIF 5.5X30 (Screw) ×2 IMPLANT
SCREW PAS PLIF 5X30 (Screw) ×1 IMPLANT
SCREW PLIF MAS 5.0X35 (Screw) ×3 IMPLANT
SCREW SHANK 5.0X30MM (Screw) ×3 IMPLANT
SCREW SHANK 5.0X35 (Screw) ×3 IMPLANT
SCREW TULIP 5.5 (Screw) ×6 IMPLANT
SPONGE LAP 4X18 X RAY DECT (DISPOSABLE) IMPLANT
SPONGE SURGIFOAM ABS GEL 100 (HEMOSTASIS) ×3 IMPLANT
STRIP CLOSURE SKIN 1/2X4 (GAUZE/BANDAGES/DRESSINGS) ×4 IMPLANT
SUT VIC AB 0 CT1 18XCR BRD8 (SUTURE) ×1 IMPLANT
SUT VIC AB 0 CT1 8-18 (SUTURE) ×2
SUT VIC AB 2-0 CP2 18 (SUTURE) ×3 IMPLANT
SUT VIC AB 3-0 SH 8-18 (SUTURE) ×6 IMPLANT
TAPE STRIPS DRAPE STRL (GAUZE/BANDAGES/DRESSINGS) ×3 IMPLANT
TOWEL OR 17X24 6PK STRL BLUE (TOWEL DISPOSABLE) ×3 IMPLANT
TOWEL OR 17X26 10 PK STRL BLUE (TOWEL DISPOSABLE) ×3 IMPLANT
TRAP SPECIMEN MUCOUS 40CC (MISCELLANEOUS) IMPLANT
TRAY FOLEY W/METER SILVER 14FR (SET/KITS/TRAYS/PACK) ×3 IMPLANT
WATER STERILE IRR 1000ML POUR (IV SOLUTION) ×3 IMPLANT

## 2015-10-11 NOTE — Op Note (Signed)
10/11/2015  12:18 PM  PATIENT:  Brandy HanksLisa A Meyer  59 y.o. 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 Decompressive lumbar hemifacetectomy L to decompress the L L4 and L5 roots 2. Posterior fixation L4-5 bilateral using cortical pedicle screws.  3. Intertransverse arthrodesis L4-5 bilateral using morcellized autograft and allograft.  SURGEON:  Marikay Alaravid Renelda Kilian, MD  ASSISTANTS: Dr. Mikal Planeabell  ANESTHESIA:  General  EBL: 50 ml  Total I/O In: 1000 [I.V.:1000] Out: 200 [Urine:150; Blood:50]  BLOOD ADMINISTERED:none  DRAINS: Hemovac   INDICATION FOR PROCEDURE: This patient presented with severe left leg pain. She had undergone a previous full decompressive laminectomy for synovial cyst at L4-5 last year. SHe tried medical management without relief. I recommended decompression and instrumented fusion in order to adequately decompress the L4 nerve root.Patient understood the risks, benefits, and alternatives and potential outcomes and wished to proceed.  PROCEDURE DETAILS:  The patient was brought to the operating room. After induction of generalized endotracheal anesthesia the patient was rolled into the prone position on chest rolls and all pressure points were padded. The patient's lumbar region was cleaned and then prepped with DuraPrep and draped in the usual sterile fashion. Anesthesia was injected and then a dorsal midline incision was made and carried down to the lumbosacral fascia. The fascia was opened and the paraspinous musculature was taken down in a subperiosteal fashion to expose L4-5. A self-retaining retractor was placed. Intraoperative fluoroscopy confirmed my level, and I started with placement of the L4 cortical pedicle screws. The pedicle screw entry zones were identified utilizing surface landmarks and  AP and lateral fluoroscopy. I scored the cortex with the high-speed  drill and then used the hand drill and EMG monitoring to drill an upward and outward direction into the pedicle. I then tapped line to line, and the tap was also monitored. I then placed a 5-0 x 35 mm cortical pedicle screw into the pedicles of L4 bilaterally. I then turned my attention to the decompression and the spinous process was removed and complete lumbar laminectomies, hemi- facetectomies, and foraminotomies were performed at L4-5 left. The patient had significant foraminal stenosis taken very to far lateral disc herniation and significant facet arthropathy. Much more generous decompression was undertaken in order to adequately decompress the neural elements and address the patient's leg pain. The yellow ligament was removed to expose the underlying dura and nerve roots, and a generous foraminotomy was performed to adequately decompress the neural elements. Both the exiting and traversing nerve roots were decompressed until a coronary dilator passed easily along the nerve roots. As a large extra foraminal disc herniation on the left at L4-5. The epidural venous vasculature was coagulated and cut sharply. Disc space was incised and the initial discectomy was performed with pituitary rongeurs. We then turned our attention to the placement of the lower pedicle screws. The pedicle screw entry zones were identified utilizing surface landmarks and fluoroscopy. I drilled into each pedicle utilizing the hand drill and EMG monitoring, and tapped each pedicle with the appropriate tap. We palpated with a ball probe to assure no break in the cortex. We then placed 5-0 x 30 mm cortical pedicle screws into the pedicles bilaterally at L5. We then decorticated the transverse processes and laid a mixture of morcellized autograft and allograft out over these to perform intertransverse arthrodesis at L4-5 bilaterally. We then placed lordotic rods into the multiaxial screw  heads of the pedicle screws and locked these in  position with the locking caps and anti-torque device. We then checked our construct with AP and lateral fluoroscopy. Irrigated with copious amounts of bacitracin-containing saline solution. Inspected the nerve roots once again to assure adequate decompression, lined to the dura with Gelfoam, and closed the muscle and the fascia with 0 Vicryl. Closed the subcutaneous tissues with 2-0 Vicryl and subcuticular tissues with 3-0 Vicryl. The skin was closed with benzoin and Steri-Strips. Dressing was then applied, the patient was awakened from general anesthesia and transported to the recovery room in stable condition. At the end of the procedure all sponge, needle and instrument counts were correct.   PLAN OF CARE: Admit to inpatient   PATIENT DISPOSITION:  PACU - hemodynamically stable.   Delay start of Pharmacological VTE agent (>24hrs) due to surgical blood loss or risk of bleeding:  yes

## 2015-10-11 NOTE — Progress Notes (Signed)
Utilization review completed. Verdelle Valtierra, RN, BSN. 

## 2015-10-11 NOTE — H&P (Signed)
Subjective: Patient is a 59 y.o. female admitted for LLE pain. Onset of symptoms was a few weeks ago, rapidly worsening since that time.  The pain is rated intense, and is located at the across the lower back and radiates to LLE. The pain is described as aching and occurs all day. The symptoms have been progressive. Symptoms are exacerbated by exercise. MRI or CT showed foraminal stenosis and previous decompression L4-5.   Past Medical History  Diagnosis Date  . Hyperlipidemia   . Anxiety   . Osteopenia     by DEXA scan at [facility] on January 30, 2009  . H/O exercise stress test     ETT, without ischemia, echocardiogram stable   . Sinus tachycardia (HCC)   . Hypertension     Past Surgical History  Procedure Laterality Date  . Back surgery  06/2014  . Lung surgery Left 2003    saw a spot on her lung , removed it, benign    Prior to Admission medications   Medication Sig Start Date End Date Taking? Authorizing Provider  atorvastatin (LIPITOR) 40 MG tablet Take 60 mg by mouth daily at 6 PM.  07/02/14  Yes Historical Provider, MD  EPINEPHrine 0.3 mg/0.3 mL IJ SOAJ injection Inject 0.3 mg into the muscle as needed (for allergic reaction).    Yes Historical Provider, MD  ibuprofen (ADVIL,MOTRIN) 200 MG tablet Take 800 mg by mouth every 6 (six) hours as needed for mild pain or moderate pain.   Yes Historical Provider, MD  metoprolol succinate (TOPROL-XL) 25 MG 24 hr tablet TAKE 1 TABLET(S) BY MOUTH DAILY 10/23/14  Yes Corky Crafts, MD  Oxycodone HCl 10 MG TABS Take 10 mg by mouth every 4 (four) hours as needed.   Yes Historical Provider, MD   Allergies  Allergen Reactions  . Shellfish Allergy Hives, Shortness Of Breath and Swelling    Crab meat and lobster.  Can tolerate small doses of shrimp.   . Effexor [Venlafaxine] Other (See Comments)    Sleepy/hyper  . Penicillins     Had as a child; doesn't remember    Social History  Substance Use Topics  . Smoking status: Former Smoker   Quit date: 05/08/2008  . Smokeless tobacco: Not on file  . Alcohol Use: Yes     Comment: social    Family History  Problem Relation Age of Onset  . Heart attack Mother   . Lung cancer Father   . Alcoholism Father      Review of Systems  Positive ROS: neg  All other systems have been reviewed and were otherwise negative with the exception of those mentioned in the HPI and as above.  Objective: Vital signs in last 24 hours: Temp:  [98.1 F (36.7 C)] 98.1 F (36.7 C) (11/11 0759) Pulse Rate:  [76] 76 (11/11 0759) Resp:  [20] 20 (11/11 0759) BP: (146)/(90) 146/90 mmHg (11/11 0759) SpO2:  [99 %] 99 % (11/11 0759) Weight:  [56.7 kg (125 lb)] 56.7 kg (125 lb) (11/11 1610)  General Appearance: Alert, cooperative, no distress, appears stated age Head: Normocephalic, without obvious abnormality, atraumatic Eyes: PERRL, conjunctiva/corneas clear, EOM's intact    Neck: Supple, symmetrical, trachea midline Back: Symmetric, no curvature, ROM normal, no CVA tenderness Lungs:  respirations unlabored Heart: Regular rate and rhythm Abdomen: Soft, non-tender Extremities: Extremities normal, atraumatic, no cyanosis or edema Pulses: 2+ and symmetric all extremities Skin: Skin color, texture, turgor normal, no rashes or lesions  NEUROLOGIC:   Mental  status: Alert and oriented x4,  no aphasia, good attention span, fund of knowledge, and memory Motor Exam - grossly normal Sensory Exam - grossly normal Reflexes: 1+ Coordination - grossly normal Gait - grossly normal Balance - grossly normal Cranial Nerves: I: smell Not tested  II: visual acuity  OS: nl    OD: nl  II: visual fields Full to confrontation  II: pupils Equal, round, reactive to light  III,VII: ptosis None  III,IV,VI: extraocular muscles  Full ROM  V: mastication Normal  V: facial light touch sensation  Normal  V,VII: corneal reflex  Present  VII: facial muscle function - upper  Normal  VII: facial muscle function -  lower Normal  VIII: hearing Not tested  IX: soft palate elevation  Normal  IX,X: gag reflex Present  XI: trapezius strength  5/5  XI: sternocleidomastoid strength 5/5  XI: neck flexion strength  5/5  XII: tongue strength  Normal    Data Review Lab Results  Component Value Date   WBC 8.8 10/07/2015   HGB 13.7 10/07/2015   HCT 40.4 10/07/2015   MCV 87.3 10/07/2015   PLT 405* 10/07/2015   Lab Results  Component Value Date   NA 134* 10/07/2015   K 4.5 10/07/2015   CL 99* 10/07/2015   CO2 24 10/07/2015   BUN 11 10/07/2015   CREATININE 0.57 10/07/2015   GLUCOSE 101* 10/07/2015   Lab Results  Component Value Date   INR 0.97 10/07/2015    Assessment/Plan: Patient admitted for decompression and instrumented fusion L4-5. Patient has failed a reasonable attempt at conservative therapy.  I explained the condition and procedure to the patient and answered any questions.  Patient wishes to proceed with procedure as planned. Understands risks/ benefits and typical outcomes of procedure.   Kaige Whistler S 10/11/2015 8:57 AM

## 2015-10-11 NOTE — Anesthesia Preprocedure Evaluation (Addendum)
Anesthesia Evaluation  Patient identified by MRN, date of birth, ID band Patient awake    Reviewed: Allergy & Precautions, NPO status , Patient's Chart, lab work & pertinent test results, reviewed documented beta blocker date and time   History of Anesthesia Complications Negative for: history of anesthetic complications  Airway Mallampati: II  TM Distance: >3 FB Neck ROM: Full    Dental  (+) Teeth Intact, Dental Advisory Given, Caps   Pulmonary neg shortness of breath, neg sleep apnea, neg COPD, neg recent URI, former smoker,    breath sounds clear to auscultation       Cardiovascular hypertension, Pt. on medications and Pt. on home beta blockers  Rhythm:Regular     Neuro/Psych PSYCHIATRIC DISORDERS Anxiety negative neurological ROS     GI/Hepatic negative GI ROS, Neg liver ROS,   Endo/Other  negative endocrine ROS  Renal/GU negative Renal ROS     Musculoskeletal   Abdominal   Peds  Hematology negative hematology ROS (+)   Anesthesia Other Findings   Reproductive/Obstetrics                           Anesthesia Physical Anesthesia Plan  ASA: II  Anesthesia Plan: General   Post-op Pain Management:    Induction: Intravenous  Airway Management Planned: Oral ETT  Additional Equipment: None  Intra-op Plan:   Post-operative Plan: Extubation in OR  Informed Consent: I have reviewed the patients History and Physical, chart, labs and discussed the procedure including the risks, benefits and alternatives for the proposed anesthesia with the patient or authorized representative who has indicated his/her understanding and acceptance.   Dental advisory given  Plan Discussed with: CRNA, Surgeon and Anesthesiologist  Anesthesia Plan Comments:        Anesthesia Quick Evaluation

## 2015-10-11 NOTE — Transfer of Care (Signed)
Immediate Anesthesia Transfer of Care Note  Patient: Brandy HanksLisa A Meyer  Procedure(s) Performed: Procedure(s) with comments: Extraforaminal Decompression L4-5 left, Posterior Lateral fusion,L4-L5, pedicle screw fixation L4-5 (N/A) - Extraforaminal Decompression L4-5 left, Posterior Lateral fusion,L4-L5, pedicle screw fixation L4-5  Patient Location: PACU  Anesthesia Type:General  Level of Consciousness: awake  Airway & Oxygen Therapy: Patient Spontanous Breathing and Patient connected to face mask oxygen  Post-op Assessment: Report given to RN and Post -op Vital signs reviewed and stable  Post vital signs: Reviewed and stable  Last Vitals:  Filed Vitals:   10/11/15 0759  BP: 146/90  Pulse: 76  Temp: 36.7 C  Resp: 20    Complications: No apparent anesthesia complications

## 2015-10-11 NOTE — Anesthesia Postprocedure Evaluation (Signed)
  Anesthesia Post-op Note  Patient: Brandy HanksLisa A Meyer  Procedure(s) Performed: Procedure(s) with comments: Extraforaminal Decompression L4-5 left, Posterior Lateral fusion,L4-L5, pedicle screw fixation L4-5 (N/A) - Extraforaminal Decompression L4-5 left, Posterior Lateral fusion,L4-L5, pedicle screw fixation L4-5  Patient Location: PACU  Anesthesia Type:General  Level of Consciousness: awake  Airway and Oxygen Therapy: Patient Spontanous Breathing and Patient connected to nasal cannula oxygen  Post-op Pain: mild  Post-op Assessment: Post-op Vital signs reviewed, Patient's Cardiovascular Status Stable, Respiratory Function Stable, Patent Airway, No signs of Nausea or vomiting and Pain level controlled LLE Motor Response: Purposeful movement LLE Sensation: Full sensation RLE Motor Response: Purposeful movement RLE Sensation: Full sensation      Post-op Vital Signs: Reviewed and stable  Last Vitals:  Filed Vitals:   10/11/15 1410  BP: 157/82  Pulse: 81  Temp: 36.7 C  Resp: 20    Complications: No apparent anesthesia complications

## 2015-10-11 NOTE — Evaluation (Signed)
Occupational Therapy Evaluation Patient Details Name: Brandy Meyer MRN: [id number] DOB: 1956-08-06 Today's Date: 10/11/2015    History of Present Illness L4-5 bil fusion   Clinical Impression   This 59 yo female admitted and underwent above presents to acute OT with increased pain, decreased mobility, decreased balance, and back precautions all affecting her PLOF of independent with basic ADLs. She will benefit from one more session of OT prior to D/C.    Follow Up Recommendations  No OT follow up    Equipment Recommendations  None recommended by OT       Precautions / Restrictions Precautions Precautions: Back Required Braces or Orthoses: Spinal Brace Spinal Brace: Lumbar corset;Applied in sitting position Restrictions Weight Bearing Restrictions: No      Mobility Bed Mobility Overal bed mobility: Needs Assistance Bed Mobility: Rolling;Sidelying to Sit Rolling: Min assist Sidelying to sit: Min assist       General bed mobility comments: VCs for technique  Transfers Overall transfer level: Needs assistance Equipment used: Rolling walker (2 wheeled) Transfers: Sit to/from Stand Sit to Stand: Min assist         General transfer comment: VCs for safe hand placement (IV in right hand in bad spot)    Balance Overall balance assessment: Needs assistance Sitting-balance support: Feet supported;No upper extremity supported Sitting balance-Leahy Scale: Fair     Standing balance support: Bilateral upper extremity supported;During functional activity Standing balance-Leahy Scale: Poor Standing balance comment: Needs use of RW                            ADL Overall ADL's : Needs assistance/impaired Eating/Feeding: Independent;Sitting   Grooming: Set up;Sitting   Upper Body Bathing: Set up;Sitting   Lower Body Bathing: Moderate assistance (min A sit<>stand)   Upper Body Dressing : Set up;Sitting   Lower Body Dressing: Maximal assistance (min  A sit<>stand)   Toilet Transfer: Minimal assistance;Ambulation;RW;Regular Toilet;Grab bars   Toileting- Clothing Manipulation and Hygiene: Min guard (min A sit<>stand)               Vision Additional Comments: No change from baseline          Pertinent Vitals/Pain Pain Assessment: Faces Faces Pain Scale: Hurts little more Pain Location: left groin Pain Descriptors / Indicators: Aching;Sore Pain Intervention(s): Monitored during session;Repositioned;Premedicated before session (made RN aware)     Hand Dominance Right   Extremity/Trunk Assessment Upper Extremity Assessment Upper Extremity Assessment: Overall WFL for tasks assessed   Lower Extremity Assessment Lower Extremity Assessment: Defer to PT evaluation       Communication Communication Communication: No difficulties   Cognition Arousal/Alertness: Awake/alert Behavior During Therapy: WFL for tasks assessed/performed Overall Cognitive Status: Within Functional Limits for tasks assessed                                Home Living Family/patient expects to be discharged to:: Private residence Living Arrangements: Spouse/significant other Available Help at Discharge: Family;Available 24 hours/day Type of Home: House Home Access: Level entry     Home Layout: One level     Bathroom Shower/Tub: Walk-in shower;Door   [employer] Toilet: Standard     Home Equipment:  (3 wheeled walker)          Prior Functioning/Environment Level of Independence: Independent             OT Diagnosis: Generalized weakness;Acute pain  OT Problem List: Decreased strength;Impaired balance (sitting and/or standing);Pain;Decreased knowledge of use of DME or AE;Decreased knowledge of precautions   OT Treatment/Interventions: Self-care/ADL training;DME and/or AE instruction;Patient/family education;Balance training    OT Goals(Current goals can be found in the care plan section) Acute Rehab OT Goals Patient  Stated Goal: home possibly tomorrow OT Goal Formulation: With patient Time For Goal Achievement: 10/18/15 Potential to Achieve Goals: Good  OT Frequency: Min 2X/week              End of Session Equipment Utilized During Treatment: Gait belt;Rolling walker;Back brace Nurse Communication: Mobility status (NT as well for mobility status; left groin pain made RN aware)  Activity Tolerance: Patient tolerated treatment well Patient left: in chair;with call bell/phone within reach   Time: 1653-1730 OT Time Calculation (min): 37 min Charges:  OT General Charges $OT Visit: 1 Procedure OT Evaluation $Initial OT Evaluation Tier I: 1 Procedure OT Treatments $Self Care/Home Management : 8-22 mins  Evette GeorgesLeonard, Jariah Tarkowski Eva [phone] 10/11/2015, 5:43 PM

## 2015-10-11 NOTE — Progress Notes (Signed)
ANTIBIOTIC CONSULT NOTE - INITIAL  Pharmacy Consult for Vanco Indication: Post-op surgical prophx.  Allergies  Allergen Reactions  . Shellfish Allergy Hives, Shortness Of Breath and Swelling    Crab meat and lobster.  Can tolerate small doses of shrimp.   . Effexor [Venlafaxine] Other (See Comments)    Sleepy/hyper  . Penicillins     Had as a child; doesn't remember    Patient Measurements: Height: 5' 0.5" (153.7 cm) Weight: 127 lb 14.4 oz (58.015 kg) IBW/kg (Calculated) : 46.65 Adjusted Body Weight:    Vital Signs: Temp: 98 F (36.7 C) (11/11 1410) Temp Source: Oral (11/11 1410) BP: 157/82 mmHg (11/11 1410) Pulse Rate: 81 (11/11 1410) Intake/Output from previous day:   Intake/Output from this shift: Total I/O In: 1000 [I.V.:1000] Out: 525 [Urine:475; Blood:50]  Labs: No results for input(s): WBC, HGB, PLT, LABCREA, CREATININE in the last 72 hours. Estimated Creatinine Clearance: 61.2 mL/min (by C-G formula based on Cr of 0.57). No results for input(s): VANCOTROUGH, VANCOPEAK, VANCORANDOM, GENTTROUGH, GENTPEAK, GENTRANDOM, TOBRATROUGH, TOBRAPEAK, TOBRARND, AMIKACINPEAK, AMIKACINTROU, AMIKACIN in the last 72 hours.   Microbiology: Recent Results (from the past 720 hour(s))  Surgical pcr screen     Status: Abnormal   Collection Time: 10/07/15 10:57 AM  Result Value Ref Range Status   MRSA, PCR NEGATIVE NEGATIVE Final   Staphylococcus aureus POSITIVE (A) NEGATIVE Final    Comment:        The Xpert SA Assay (FDA approved for NASAL specimens in patients over 59 years of age), is one component of a comprehensive surveillance program.  Test performance has been validated by [facility] for patients greater than or equal to 59 year old. It is not intended to diagnose infection nor to guide or monitor treatment.     Medical History: Past Medical History  Diagnosis Date  . Hyperlipidemia   . Anxiety   . Osteopenia     [facility]  .  H/O exercise stress test     ETT, without ischemia, echocardiogram stable   . Sinus tachycardia (HCC)   . Hypertension     Medications:  Prescriptions prior to admission  Medication Sig Dispense Refill Last Dose  . atorvastatin (LIPITOR) 40 MG tablet Take 60 mg by mouth daily at 6 PM.    10/10/2015 at Unknown time  . EPINEPHrine 0.3 mg/0.3 mL IJ SOAJ injection Inject 0.3 mg into the muscle as needed (for allergic reaction).    Taking  . ibuprofen (ADVIL,MOTRIN) 200 MG tablet Take 800 mg by mouth every 6 (six) hours as needed for mild pain or moderate pain.   Past Week at Unknown time  . metoprolol succinate (TOPROL-XL) 25 MG 24 hr tablet TAKE 1 TABLET(S) BY MOUTH DAILY 30 tablet 10 10/10/2015 at Unknown time  . Oxycodone HCl 10 MG TABS Take 10 mg by mouth every 4 (four) hours as needed.   10/10/2015 at Unknown time   Assessment: 59 y/o F with rapidly worsening LLE  And back pain. MRI or CT showed foraminal stenosis and previous decompression L4-5. Lumbar laminectomy 11/11 with hemovac drain post-op. CrCl 61.  Goal of Therapy:  Vancomycin trough level 10-15 mcg/ml  Plan:  Vancomycin 1g IV x 1 in OR 0945 Continue Vancomycin 1g IV q24h until drain removed.   Gauge Winski S. Merilynn Finlandobertson, PharmD, BCPS Clinical Staff Pharmacist Pager [phone]  Misty Stanleyobertson, Riyan Gavina Stillinger 10/11/2015,2:13 PM

## 2015-10-11 NOTE — Anesthesia Procedure Notes (Signed)
Procedure Name: Intubation Date/Time: 10/11/2015 10:00 AM Performed by: Brien MatesMAHONY, Shanira Tine D Pre-anesthesia Checklist: Patient identified, Emergency Drugs available, Suction available, Patient being monitored and Timeout performed Patient Re-evaluated:Patient Re-evaluated prior to inductionOxygen Delivery Method: Circle system utilized Preoxygenation: Pre-oxygenation with 100% oxygen Intubation Type: IV induction Ventilation: Mask ventilation without difficulty Laryngoscope Size: Miller and 2 Grade View: Grade I Tube type: Oral Tube size: 7.5 mm Number of attempts: 1 Airway Equipment and Method: Stylet Placement Confirmation: ETT inserted through vocal cords under direct vision,  positive ETCO2 and breath sounds checked- equal and bilateral Secured at: 21 cm Tube secured with: Tape Dental Injury: Teeth and Oropharynx as per pre-operative assessment

## 2015-10-12 MED ORDER — OXYCODONE HCL 10 MG PO TABS: 10.0000 mg | ORAL_TABLET | ORAL | Status: DC | PRN

## 2015-10-12 MED ORDER — METHOCARBAMOL 500 MG PO TABS: 500.0000 mg | ORAL_TABLET | Freq: Four times a day (QID) | ORAL | Status: DC | PRN

## 2015-10-12 NOTE — Progress Notes (Signed)
Patient ready for discharge to home; husband at bedside; reacher equipment obtained from gift shop; patient dressed and wearing her brace; discharge instructions given; rx given and reviewed with patient; patient ready to release via wheelchair.

## 2015-10-12 NOTE — Progress Notes (Signed)
Occupational Therapy Treatment Patient Details Name: Brandy HanksLisa A Meyer MRN: [id number] DOB: March 10, 1956 Today's Date: 10/12/2015    History of present illness L4-5 bil fusion   OT comments  This 59 yo female admitted and underwent above presents to acute OT with all education completed and husband to A prn. Acute OT will sign off with pt to D/C today.  Follow Up Recommendations  No OT follow up    Equipment Recommendations  None recommended by OT       Precautions / Restrictions Precautions Precautions: Back Precaution Comments: Pt able to state 3/3 back precautions Required Braces or Orthoses: Spinal Brace Spinal Brace: Lumbar corset;Applied in sitting position Restrictions Weight Bearing Restrictions: No       Mobility Bed Mobility Overal bed mobility: Needs Assistance Bed Mobility: Rolling;Sidelying to Sit;Sit to Sidelying Rolling: Min assist Sidelying to sit: Min assist     Sit to sidelying: Min assist General bed mobility comments: VCs for technique--husand will be there to A  Transfers Overall transfer level: Needs assistance Equipment used: Rolling walker (2 wheeled) Transfers: Sit to/from Stand Sit to Stand: Min guard         General transfer comment: VCs for safe hand placement         ADL Overall ADL's : Needs assistance/impaired                     Lower Body Dressing: Supervision/safety;Set up;With adaptive equipment;Adhering to back precautions (min guard A sit<>stand)   Toilet Transfer: Ambulation;RW;Grab bars;Min guard   Toileting- Clothing Manipulation and Hygiene: Supervision/safety (minguard A sit<>stand)         General ADL Comments: I demonstrated to pt how to do shower stall tranfer while she was seated on toilet in bathroom--she felt she did not need to practice it.                Cognition   Behavior During Therapy: WFL for tasks assessed/performed Overall Cognitive Status: Within Functional Limits for tasks  assessed                                    Pertinent Vitals/ Pain       Pain Assessment: 0-10 Pain Score: 8  Pain Location: left groin and back Pain Descriptors / Indicators: Aching;Sore;Grimacing;Guarding Pain Intervention(s): Monitored during session;Repositioned;Patient requesting pain meds-RN notified;RN gave pain meds during session         Frequency Min 2X/week     Progress Toward Goals  OT Goals(current goals can now be found in the care plan section)  Progress towards OT goals: Progressing toward goals (all education completed)     Plan Discharge plan remains appropriate       End of Session Equipment Utilized During Treatment: Rolling walker;Back brace   Activity Tolerance Patient tolerated treatment well   Patient Left in bed;with call bell/phone within reach   Nurse Communication          Time: [id number] OT Time Calculation (min): 61 min  Charges: OT General Charges $OT Visit: 1 Procedure OT Treatments $Self Care/Home Management : 53-67 mins  Evette GeorgesLeonard, Yuto Cajuste Eva [phone] 10/12/2015, 11:33 AM

## 2015-10-12 NOTE — Discharge Summary (Signed)
Physician Discharge Summary  Patient ID: Brandy Meyer MRN: [id number] DOB/AGE: 59-Nov-1957 59 y.o.  Admit date: 10/11/2015 Discharge date: 10/12/2015  Admission Diagnoses: Lumbar instability and stenosis   Discharge Diagnoses: Same   Discharged Condition: good  Hospital Course: The patient was admitted on 10/11/2015 and taken to the operating room where the patient underwent lumbar decompression and fusion L4-5. The patient tolerated the procedure well and was taken to the recovery room and then to the floor in stable condition. The hospital course was routine. There were no complications. The wound remained clean dry and intact. Pt had appropriate back soreness. No complaints of leg pain or new N/T/W. The patient remained afebrile with stable vital signs, and tolerated a regular diet. The patient continued to increase activities, and pain was well controlled with oral pain medications.   Consults: None  Significant Diagnostic Studies:  Results for orders placed or performed during the hospital encounter of 10/07/15  Surgical pcr screen  Result Value Ref Range   MRSA, PCR NEGATIVE NEGATIVE   Staphylococcus aureus POSITIVE (A) NEGATIVE  Basic metabolic panel  Result Value Ref Range   Sodium 134 (L) 135 - 145 mmol/L   Potassium 4.5 3.5 - 5.1 mmol/L   Chloride 99 (L) 101 - 111 mmol/L   CO2 24 22 - 32 mmol/L   Glucose, Bld 101 (H) 65 - 99 mg/dL   BUN 11 6 - 20 mg/dL   Creatinine, Ser 4.09 0.44 - 1.00 mg/dL   Calcium 9.0 8.9 - 81.1 mg/dL   GFR calc non Af Amer >60 >60 mL/min   GFR calc Af Amer >60 >60 mL/min   Anion gap 11 5 - 15  CBC WITH DIFFERENTIAL  Result Value Ref Range   WBC 8.8 4.0 - 10.5 K/uL   RBC 4.63 3.87 - 5.11 MIL/uL   Hemoglobin 13.7 12.0 - 15.0 g/dL   HCT 91.4 78.2 - 95.6 %   MCV 87.3 78.0 - 100.0 fL   MCH 29.6 26.0 - 34.0 pg   MCHC 33.9 30.0 - 36.0 g/dL   RDW 21.3 08.6 - 57.8 %   Platelets 405 (H) 150 - 400 K/uL   Neutrophils Relative % 65 %   Neutro  Abs 5.7 1.7 - 7.7 K/uL   Lymphocytes Relative 27 %   Lymphs Abs 2.3 0.7 - 4.0 K/uL   Monocytes Relative 7 %   Monocytes Absolute 0.6 0.1 - 1.0 K/uL   Eosinophils Relative 1 %   Eosinophils Absolute 0.1 0.0 - 0.7 K/uL   Basophils Relative 0 %   Basophils Absolute 0.0 0.0 - 0.1 K/uL  Protime-INR  Result Value Ref Range   Prothrombin Time 13.1 11.6 - 15.2 seconds   INR 0.97 0.00 - 1.49  Type and screen All Cardiac and thoracic surgeries, spinal fusions, myomectomies, craniotomies, colon & liver resections, total joint revisions, same day c-section with placenta previa or accreta.  Result Value Ref Range   ABO/RH(D) A POS    Antibody Screen NEG    Sample Expiration 10/21/2015    Extend sample reason NO TRANSFUSIONS OR PREGNANCY IN THE PAST 3 MONTHS   ABO/Rh  Result Value Ref Range   ABO/RH(D) A POS     Chest 2 View  10/07/2015  CLINICAL DATA:  Back surgery. EXAM: CHEST  2 VIEW COMPARISON:  No prior exams for comparison. FINDINGS: Mediastinum hilar structures are normal. Postsurgical changes left perihilar region and upper lobe. No focal infiltrate. No pleural effusion or pneumothorax. Degenerative changes  thoracic spine. Prior cervical spine fusion. IMPRESSION: Left lung postsurgical change with scarring.  No acute abnormality. Electronically Signed   By: Maisie Fushomas  Register   On: 10/07/2015 12:20   Dg Lumbar Spine 2-3 Views  10/11/2015  CLINICAL DATA:  Low back pain, L4-5 fusion EXAM: DG C-ARM 61-120 MIN; LUMBAR SPINE - 2-3 VIEW COMPARISON:  None. FINDINGS: Two intraoperative spot images demonstrate changes of posterior fusion at L4-5. No hardware or bony complicating feature. IMPRESSION: Posterior fusion at L4-5. Electronically Signed   By: Charlett NoseKevin  Dover M.D.   On: 10/11/2015 12:20   Mr Lumbar Spine W Wo Contrast  09/20/2015  CLINICAL DATA:  Severe left low back and left leg pain for 1 month. History of prior lumbar surgery July, 2015. EXAM: MRI LUMBAR SPINE WITHOUT AND WITH CONTRAST  TECHNIQUE: Multiplanar and multiecho pulse sequences of the lumbar spine were obtained without and with intravenous contrast. CONTRAST:  12 cc MultiHance IV. COMPARISON:  MRI lumbar spine 05/04/2014. FINDINGS: Vertebral body height is maintained. Trace anterolisthesis L3 on L4 is seen. Alignment is otherwise unremarkable. Degenerative endplate signal change is present at L4-5. The conus medullaris is normal in signal and position. Imaged intra-abdominal contents are unremarkable. The T11-12 and T12-L1 levels are imaged in the sagittal plane only. The central canal and foramina appear open at each level with minimal disc bulging noted. L1-2:  Negative. L2-3: Shallow disc bulge and mild to moderate facet degenerative change without central canal or foraminal narrowing. Stable in appearance. L3-4: Mild to moderate facet degenerative disease is identified. There is a shallow disc bulge. Mild central canal narrowing is present. The foramina are open. The appearance is unchanged. L4-5: Previously seen large synovial cysts has been resected. There is a disc bulge with a superimposed very shallow down turning central protrusion. The thecal sac is widely patent. Disc protrusion in the left foramen which extends beyond the foramen causes severe foraminal stenosis in conjunction with facet arthropathy. The exiting left L4 root is compressed within the foramen. Mild right foraminal narrowing is seen. Left foraminal stenosis appears worsened since the prior exam. L5-S1: Facet degenerative disease is seen and there is a shallow disc bulge to the right. The central canal is open. Moderately severe right foraminal narrowing is unchanged. The left foramen is patent. IMPRESSION: Interval resection of a large synovial cyst at L4-5. The central canal is open. Left foraminal and extra foraminal disc protrusion in conjunction with severe facet arthropathy causes severe left foraminal narrowing which has worsened since the prior MRI.  There is compression of the exiting left L5 root within the foramen. No change in moderately severe right foraminal narrowing L5-S1. Electronically Signed   By: Drusilla Kannerhomas  Dalessio M.D.   On: 09/20/2015 15:36   Dg Epidural/nerve Root  09/27/2015  CLINICAL DATA:  Lumbosacral spondylosis without myelopathy. Lumbar radiculopathy. Left-sided low back and left lower extremity pain. Severe left neural foraminal stenosis at L4-5 on MRI. Left-sided L4 nerve root block requested. EXAM: EPIDURAL/NERVE ROOT FLUOROSCOPY TIME:  Radiation Exposure Index (as provided by the fluoroscopic device): 0.329 Gy*cm^2 PROCEDURE: The procedure, risks, benefits, and alternatives were explained to the patient. Questions regarding the procedure were encouraged and answered. The patient understands and consents to the procedure. LEFT L4 NERVE ROOT BLOCK AND TRANSFORAMINAL EPIDURAL: A posterior oblique approach was taken to the intervertebral foramen on the left at L4-5 using a curved 22 gauge spinal needle. Injection of Omnipaque 180 outlined the left L4 nerve root and showed good epidural spread. Due  to radicular pain elicited with needle advancement towards the foramen as well as severe facet arthrosis and foraminal stenosis at this level, a more lateral final needle position was necessary and epidural contrast spread was peripheral without significant central spread. No vascular opacification is seen. 120 mg of Depo-Medrol mixed with 1.5 mL of 1% lidocaine were instilled. The procedure was well-tolerated, and the patient was discharged thirty minutes following the injection in good condition. COMPLICATIONS: None IMPRESSION: Technically successful injection consisting of a left L4 nerve root block. Electronically Signed   By: Sebastian Acheady M.D.   On: 09/27/2015 14:42   Dg C-arm 1-60 Min  10/11/2015  CLINICAL DATA:  Low back pain, L4-5 fusion EXAM: DG C-ARM 61-120 MIN; LUMBAR SPINE - 2-3 VIEW COMPARISON:  None. FINDINGS: Two intraoperative  spot images demonstrate changes of posterior fusion at L4-5. No hardware or bony complicating feature. IMPRESSION: Posterior fusion at L4-5. Electronically Signed   By: Charlett Nose M.D.   On: 10/11/2015 12:20    Antibiotics:  Anti-infectives    Start     Dose/Rate Route Frequency Ordered Stop   10/12/15 0930  vancomycin (VANCOCIN) IVPB 1000 mg/200 mL premix     1,000 mg 200 mL/hr over 60 Minutes Intravenous Every 24 hours 10/11/15 1416     10/11/15 1200  vancomycin (VANCOCIN) powder  Status:  Discontinued       As needed 10/11/15 1201 10/11/15 1222   10/11/15 1151  vancomycin (VANCOCIN) 1000 MG powder    Comments:  Jeralyn Ruths   : cabinet override      10/11/15 1151 10/11/15 2359   10/11/15 1051  bacitracin 50,000 Units in sodium chloride irrigation 0.9 % 500 mL irrigation  Status:  Discontinued       As needed 10/11/15 1052 10/11/15 1222   10/11/15 0800  vancomycin (VANCOCIN) IVPB 1000 mg/200 mL premix     1,000 mg 200 mL/hr over 60 Minutes Intravenous To [facility] 10/10/15 1257 10/11/15 1045      Discharge Exam: Blood pressure 118/67, pulse 80, temperature 98.8 F (37.1 C), temperature source Oral, resp. rate 20, height 5' 0.5" (1.537 m), weight 58.015 kg (127 lb 14.4 oz), SpO2 98 %. Neurologic: Grossly normal Dressing dry  Discharge Medications:     Medication List    STOP taking these medications        ibuprofen 200 MG tablet  Commonly known as:  ADVIL,MOTRIN      TAKE these medications        atorvastatin 40 MG tablet  Commonly known as:  LIPITOR  Take 60 mg by mouth daily at 6 PM.     EPINEPHrine 0.3 mg/0.3 mL Soaj injection  Commonly known as:  EPI-PEN  Inject 0.3 mg into the muscle as needed (for allergic reaction).     methocarbamol 500 MG tablet  Commonly known as:  ROBAXIN  Take 1 tablet (500 mg total) by mouth every 6 (six) hours as needed for muscle spasms.     metoprolol succinate 25 MG 24 hr tablet  Commonly known as:  TOPROL-XL   TAKE 1 TABLET(S) BY MOUTH DAILY     Oxycodone HCl 10 MG Tabs  Take 1 tablet (10 mg total) by mouth every 4 (four) hours as needed.        Disposition: Home   Final Dx: L4-5 decompression and fusion      Discharge Instructions     Remove dressing in 72 hours    Complete by:  As  directed      Call MD for:  difficulty breathing, headache or visual disturbances    Complete by:  As directed      Call MD for:  persistant nausea and vomiting    Complete by:  As directed      Call MD for:  redness, tenderness, or signs of infection (pain, swelling, redness, odor or green/yellow discharge around incision site)    Complete by:  As directed      Call MD for:  severe uncontrolled pain    Complete by:  As directed      Call MD for:  temperature >100.4    Complete by:  As directed      Diet - low sodium heart healthy    Complete by:  As directed      Discharge instructions    Complete by:  As directed   May shower     Driving Restrictions    Complete by:  As directed   No driving     Increase activity slowly    Complete by:  As directed      Lifting restrictions    Complete by:  As directed   No bending or twisting or lifting more than 8 pounds           Follow-up Information    Follow up with Fatuma Dowers S, MD In 2 weeks.   Specialty:  Neurosurgery   Contact information:   [address] [place] [address] [phone]        Signed: Tia Alert 10/12/2015, 7:19 AM

## 2015-10-12 NOTE — Evaluation (Signed)
Physical Therapy Evaluation Patient Details Name: Brandy Meyer MRN: [id number] DOB: 12/22/1955 Today's Date: 10/12/2015   History of Present Illness  L4-5 bil fusion  Clinical Impression  Pt was able to get up and walk with PT and reviewed body mechanics, with handout provided.  Her plan is to go home with family, and will be able to don brace and walk with limited help from her mother.  Will have HHPT follow due to LE pain and weakness causing an increased fall risk    Follow Up Recommendations Home health PT    Equipment Recommendations  Rolling walker with 5" wheels    Recommendations for Other Services       Precautions / Restrictions Precautions Precautions: Back;Fall Precaution Booklet Issued: Yes (comment) Precaution Comments: Pt able to state 3/3 back precautions Required Braces or Orthoses: Spinal Brace Spinal Brace: Lumbar corset;Applied in sitting position Restrictions Weight Bearing Restrictions: No      Mobility  Bed Mobility Overal bed mobility: Needs Assistance Bed Mobility: Rolling;Sidelying to Sit;Sit to Sidelying Rolling: Min guard;Min assist Sidelying to sit: Min assist     Sit to sidelying: Min assist General bed mobility comments: mainly assistance for LE's  Transfers Overall transfer level: Needs assistance Equipment used: Rolling walker (2 wheeled) Transfers: Sit to/from [facility] Sit to Stand: Min guard Stand pivot transfers: Min guard       General transfer comment: cued hands and safety with maneuvering walker  Ambulation/Gait Ambulation/Gait assistance: Min guard Ambulation Distance (Feet): 200 Feet Assistive device: Rolling walker (2 wheeled);1 person hand held assist (for safety) Gait Pattern/deviations: Step-through pattern;Narrow base of support;Trunk flexed Gait velocity: reduced Gait velocity interpretation: Below normal speed for age/gender General Gait Details: wore back brace and prompted upright  posture  Stairs            Wheelchair Mobility    Modified Rankin (Stroke Patients Only)       Balance Overall balance assessment: Needs assistance Sitting-balance support: Feet supported Sitting balance-Leahy Scale: Fair   Postural control: Posterior lean Standing balance support: Bilateral upper extremity supported Standing balance-Leahy Scale: Poor Standing balance comment: walker to control her balance                             Pertinent Vitals/Pain Pain Assessment: 0-10 Pain Score: 8  Faces Pain Scale: Hurts whole lot Pain Location: L groin and back Pain Descriptors / Indicators: Aching Pain Intervention(s): Limited activity within patient's tolerance;Premedicated before session;Repositioned    Home Living Family/patient expects to be discharged to:: Private residence Living Arrangements: Spouse/significant other Available Help at Discharge: Family;Available 24 hours/day Type of Home: House Home Access: Level entry     Home Layout: One level Home Equipment: Cane - single point;Walker - standard;Bedside commode      Prior Function Level of Independence: Independent               Hand Dominance   Dominant Hand: Right    Extremity/Trunk Assessment   Upper Extremity Assessment: Overall WFL for tasks assessed           Lower Extremity Assessment: Generalized weakness      Cervical / Trunk Assessment: Kyphotic  Communication   Communication: No difficulties  Cognition Arousal/Alertness: Awake/alert Behavior During Therapy: WFL for tasks assessed/performed Overall Cognitive Status: Within Functional Limits for tasks assessed  General Comments General comments (skin integrity, edema, etc.): Pt is demonstrating some limited endurance, but made the effort to walk down the hall which worsened her back pain (premedicated)    Exercises        Assessment/Plan    PT Assessment All further PT  needs can be met in the next venue of care  PT Diagnosis Difficulty walking   PT Problem List Decreased strength;Decreased range of motion;Decreased activity tolerance;Decreased balance;Decreased mobility;Decreased coordination;Decreased knowledge of use of DME;Decreased skin integrity;Pain  PT Treatment Interventions     PT Goals (Current goals can be found in the Care Plan section) Acute Rehab PT Goals Patient Stated Goal: to obe home PT Goal Formulation: With patient Time For Goal Achievement: 10/19/15 Potential to Achieve Goals: Good    Frequency     Barriers to discharge        Co-evaluation               End of Session Equipment Utilized During Treatment: Gait belt Activity Tolerance: Patient tolerated treatment well;Patient limited by pain Patient left: in bed;with bed alarm set;with call bell/phone within reach Nurse Communication: Mobility status         Time: 7579-7282 PT Time Calculation (min) (ACUTE ONLY): 28 min   Charges:   PT Evaluation $Initial PT Evaluation Tier I: 1 Procedure PT Treatments $Gait Training: 8-22 mins   PT G Codes:        Ramond Dial October 25, 2015, 1:24 PM   Mee Hives, PT MS Acute Rehab Dept. Number: [facility] [phone] and [place] [phone]

## 2015-10-16 ENCOUNTER — Encounter (HOSPITAL_COMMUNITY): Payer: Self-pay | Admitting: Neurological Surgery

## 2015-10-28 ENCOUNTER — Other Ambulatory Visit: Payer: Self-pay | Admitting: Interventional Cardiology

## 2015-10-29 ENCOUNTER — Other Ambulatory Visit: Payer: Self-pay | Admitting: Interventional Cardiology

## 2015-11-12 ENCOUNTER — Other Ambulatory Visit: Payer: Self-pay | Admitting: Neurological Surgery

## 2015-11-12 ENCOUNTER — Ambulatory Visit (INDEPENDENT_AMBULATORY_CARE_PROVIDER_SITE_OTHER): Payer: Federal, State, Local not specified - PPO

## 2015-11-12 DIAGNOSIS — Z9889 Other specified postprocedural states: Secondary | ICD-10-CM | POA: Diagnosis not present

## 2015-11-12 DIAGNOSIS — M5416 Radiculopathy, lumbar region: Secondary | ICD-10-CM

## 2015-12-15 ENCOUNTER — Other Ambulatory Visit: Payer: Self-pay | Admitting: Interventional Cardiology

## 2016-01-21 ENCOUNTER — Other Ambulatory Visit: Payer: Self-pay | Admitting: Neurological Surgery

## 2016-01-21 ENCOUNTER — Ambulatory Visit (INDEPENDENT_AMBULATORY_CARE_PROVIDER_SITE_OTHER): Payer: Federal, State, Local not specified - PPO

## 2016-01-21 DIAGNOSIS — M5416 Radiculopathy, lumbar region: Secondary | ICD-10-CM

## 2016-01-28 ENCOUNTER — Other Ambulatory Visit: Payer: Self-pay | Admitting: Interventional Cardiology

## 2016-02-23 ENCOUNTER — Other Ambulatory Visit: Payer: Self-pay | Admitting: Interventional Cardiology

## 2016-03-02 ENCOUNTER — Telehealth: Payer: Self-pay | Admitting: Interventional Cardiology

## 2016-03-02 ENCOUNTER — Other Ambulatory Visit: Payer: Self-pay | Admitting: *Deleted

## 2016-03-02 MED ORDER — METOPROLOL SUCCINATE ER 25 MG PO TB24: 25.0000 mg | ORAL_TABLET | Freq: Every day | ORAL | Status: DC

## 2016-03-02 NOTE — Telephone Encounter (Signed)
New message   *STAT* If patient is at the pharmacy, call can be transferred to refill team.   1. Which medications need to be refilled? (please list name of each medication and dose if known)   metoprolol succinate (TOPROL-XL) 25 MG 24 hr tablet   2. Which pharmacy/location (including street and city if local pharmacy) is [facility] [address] [facility] [place]   3. Do they need a 30 day or 90 day supply? 30 days supply   She just picked up 15 will need enough to supply her until 5/16 @ 3:45p with Dr. Eldridge DaceVaranasi

## 2016-04-14 ENCOUNTER — Ambulatory Visit (INDEPENDENT_AMBULATORY_CARE_PROVIDER_SITE_OTHER): Payer: Federal, State, Local not specified - PPO | Admitting: Interventional Cardiology

## 2016-04-14 ENCOUNTER — Encounter: Payer: Self-pay | Admitting: Interventional Cardiology

## 2016-04-14 VITALS — BP 125/78 | HR 84 | Ht 65.0 in | Wt 126.0 lb

## 2016-04-14 DIAGNOSIS — E785 Hyperlipidemia, unspecified: Secondary | ICD-10-CM

## 2016-04-14 DIAGNOSIS — R000 Tachycardia, unspecified: Secondary | ICD-10-CM

## 2016-04-14 NOTE — Progress Notes (Signed)
Patient ID: Brandy Meyer, female   DOB: [birth year], 60 y.o.   MRN: [id number]     Cardiology Office Note   Date:  04/14/2016   Brandy Meyer, DOB 06/26/1956, MRN [id number]  PCP:  Gaye Alken, MD    No chief complaint on file. Hyperlipidemia   Wt Readings from Last 3 Encounters:  04/14/16 126 lb (57.153 kg)  10/11/15 127 lb 14.4 oz (58.015 kg)  10/07/15 126 lb 11.2 oz (57.471 kg)       History of Present Illness: Brandy Meyer is a 60 y.o. female  who ahd a negative cardiac w/u in 2011. SHe had a Holter monitor placed and her average HR was > 100. SHe was placed on a beta blocker several years ago. LV function was normal in the past.   She has been walking some and has had no problems.  SHe had back surgery which has helped.  She walks much more now.    She has been tolerating her cholesterol medicines.    Past Medical History  Diagnosis Date  . Hyperlipidemia   . Anxiety   . Osteopenia     by D[facility]A scan at [facility] on January 30, 2009  . H/O exercise stress test     ETT, without ischemia, echocardiogram stable   . Sinus tachycardia (HCC)   . Hypertension     Past Surgical History  Procedure Laterality Date  . Back surgery  06/2014  . Lung surgery Left 2003    saw a spot on her lung , removed it, benign     Current Outpatient Prescriptions  Medication Sig Dispense Refill  . atorvastatin (LIPITOR) 40 MG tablet Take 60 mg by mouth daily at 6 PM.     . celecoxib (CELEBREX) 200 MG capsule Take by mouth daily.    . metoprolol succinate (TOPROL-XL) 25 MG 24 hr tablet Take 1 tablet (25 mg total) by mouth daily. 30 tablet 0   No current facility-administered medications for this visit.    Allergies:   Shellfish allergy; Effexor; and Penicillins    Social History:  The patient  reports that she quit smoking about 7 years ago. She does not have any smokeless tobacco history on file. She reports that she drinks alcohol. She reports that she does not use  illicit drugs.   Family History:  The patient's family history includes Alcoholism in her father; Heart attack in her mother; Lung cancer in her father.    ROS:  Please see the history of present illness.   Otherwise, review of systems are positive for back pain resolved.   All other systems are reviewed and negative.    PHYSICAL EXAM: VS:  BP 125/78 mmHg  Pulse 84  Ht  (1.651 m)  Wt 126 lb (57.153 kg)  BMI 20.97 kg/m2 , BMI Body mass index is 20.97 kg/(m^2). GEN: Well nourished, well developed, in no acute distress HEENT: normal Neck: no JVD, carotid bruits, or masses Cardiac: RRR; no murmurs, rubs, or gallops,no edema  Respiratory:  clear to auscultation bilaterally, normal work of breathing GI: soft, nontender, nondistended, + BS MS: no deformity or atrophy Skin: warm and dry, no rash Neuro:  Strength and sensation are intact Psych: euthymic mood, full affect   EKG:   The ekg ordered today demonstrates normal ECG in 11/16.    Recent Labs: 10/07/2015: BUN 11; Creatinine, Ser 0.57; Hemoglobin 13.7; Platelets 405*; Potassium 4.5; Sodium 134*   Lipid Panel  Component Value Date/Time   CHOL 205* 09/05/2014 0851   TRIG 244.0* 09/05/2014 0851   HDL 41.20 09/05/2014 0851   CHOLHDL 5 09/05/2014 0851   VLDL 48.8* 09/05/2014 0851   LDLDIRECT 127.0 09/05/2014 0851     Other studies Reviewed: Additional studies/ records that were reviewed today with results demonstrating: 2011 ETT negative for ischemia.   ASSESSMENT AND PLAN:  1. Sinus tachycardia: Continue Toprol-XL. She has not had any palpitations.  2. Hyperlipidemia: She is up to 60 mg daily on the atorvastatin. Will check her lipids. She would like to be on less medication if possible. She is aware of the importance of prevention given her mother's sudden MI when she was 60 years old. 3. Increase exercise now that she has had successful back surgery.  I signed a form to help get her blood pressure  cuff.    Current medicines are reviewed at length with the patient today.  The patient concerns regarding her medicines were addressed.  The following changes have been made:  No change  Labs/ tests ordered today include:  No orders of the defined types were placed in this encounter.    Recommend 150 minutes/week of aerobic exercise Low fat, low carb, high fiber diet recommended  Disposition:   FU in 1 year   Signed, Lance MussJayadeep Derek Laughter, MD  04/14/2016 4:10 PM    [facility] [address], [place], [place]  [address] Phone: [phone]; Fax: [phone]

## 2016-04-14 NOTE — Patient Instructions (Signed)
**Note De-Identified Brandy Meyer Obfuscation** Medication Instructions:  Same-no changes  Labwork: Lipids and CMET -Tomorrow  Testing/Procedures: None  Follow-Up: Your physician wants you to follow-up in: 1 year. You will receive a reminder letter in the mail two months in advance. If you don't receive a letter, please call our office to schedule the follow-up appointment.     If you need a refill on your cardiac medications before your next appointment, please call your pharmacy.

## 2016-04-15 ENCOUNTER — Other Ambulatory Visit (INDEPENDENT_AMBULATORY_CARE_PROVIDER_SITE_OTHER): Payer: Federal, State, Local not specified - PPO | Admitting: *Deleted

## 2016-04-15 DIAGNOSIS — E785 Hyperlipidemia, unspecified: Secondary | ICD-10-CM

## 2016-04-15 LAB — COMPREHENSIVE METABOLIC PANEL
ALT: 18 U/L (ref 6–29)
AST: 9 U/L — AB (ref 10–35)
Albumin: 4.2 g/dL (ref 3.6–5.1)
Alkaline Phosphatase: 68 U/L (ref 33–130)
BILIRUBIN TOTAL: 0.9 mg/dL (ref 0.2–1.2)
BUN: 11 mg/dL (ref 7–25)
CALCIUM: 9 mg/dL (ref 8.6–10.4)
CO2: 25 mmol/L (ref 20–31)
Chloride: 103 mmol/L (ref 98–110)
Creat: 0.65 mg/dL (ref 0.50–0.99)
GLUCOSE: 106 mg/dL — AB (ref 65–99)
Potassium: 4.3 mmol/L (ref 3.5–5.3)
Sodium: 138 mmol/L (ref 135–146)
TOTAL PROTEIN: 6.8 g/dL (ref 6.1–8.1)

## 2016-04-15 LAB — LIPID PANEL
CHOLESTEROL: 202 mg/dL — AB (ref 125–200)
HDL: 47 mg/dL (ref >=46)
LDL Cholesterol: 116 mg/dL (ref <130)
TRIGLYCERIDES: 196 mg/dL — AB (ref <150)
Total CHOL/HDL Ratio: 4.3 Ratio (ref <=5.0)
VLDL: 39 mg/dL — AB (ref <30)

## 2016-04-21 ENCOUNTER — Telehealth: Payer: Self-pay | Admitting: *Deleted

## 2016-04-21 NOTE — Telephone Encounter (Signed)
**Note De-Identified Elridge Stemm Obfuscation** The pt is advised and she is in agreement with plan and will continue to take 60 mg of Lipitor daily.

## 2016-04-21 NOTE — Telephone Encounter (Signed)
The pt had Lipids drawn on 04/15/16: Cholesterol 202 Triglycerides 196  She wants to know if she can lower her dose of Lipitor from 60 mg daily to 40 mg daily. Please advise.

## 2016-04-21 NOTE — Telephone Encounter (Signed)
Lipids better controlled with the 1.5 tabs.

## 2016-05-05 ENCOUNTER — Other Ambulatory Visit: Payer: Self-pay | Admitting: Interventional Cardiology

## 2016-12-31 ENCOUNTER — Ambulatory Visit (INDEPENDENT_AMBULATORY_CARE_PROVIDER_SITE_OTHER): Payer: BLUE CROSS/BLUE SHIELD | Admitting: Orthopedic Surgery

## 2016-12-31 ENCOUNTER — Encounter (INDEPENDENT_AMBULATORY_CARE_PROVIDER_SITE_OTHER): Payer: Self-pay | Admitting: Orthopedic Surgery

## 2016-12-31 ENCOUNTER — Ambulatory Visit (INDEPENDENT_AMBULATORY_CARE_PROVIDER_SITE_OTHER): Payer: BLUE CROSS/BLUE SHIELD

## 2016-12-31 DIAGNOSIS — M1612 Unilateral primary osteoarthritis, left hip: Secondary | ICD-10-CM

## 2016-12-31 MED ORDER — CELECOXIB 200 MG PO CAPS: 200.0000 mg | ORAL_CAPSULE | Freq: Every day | ORAL | 3 refills | Status: DC

## 2016-12-31 NOTE — Progress Notes (Signed)
Office Visit Note   Patient: Brandy Meyer           Date of Birth: Dec 29, 1955           MRN: [id number] Visit Date: 12/31/2016 Requested by: Juluis Rainier, MD [address] [place], [place] [address] PCP: Gaye Alken, MD  Subjective: Chief Complaint  Patient presents with  . Left Hip - Pain    HPI Brandy Meyer is a 61 year old patient with left hip pain.  She has seen Dr. Magnus Ivan in the past who recommended hip replacement.  She is good friends of mine back or neighbor and she's here for a reevaluation.  She did have back surgery in 2016 with 1 level fused.  She describes increasing pain in the groin and leg over the past 3 months.*For her to sleep.  It is difficult for her to lift the leg to put a sock on.  Celebrex twice a day does help.  She does have her husband at home.  She has not had a hip injection.              Review of Systems All systems reviewed are negative as they relate to the chief complaint within the history of present illness.  Patient denies  fevers or chills.    Assessment & Plan: Visit Diagnoses:  1. Primary osteoarthritis of left hip     Plan: Impression is left hip arthritis which is end-stage and progressive when compared to radiographs from 2015.  Currently she is bone-on-bone.  Leg lengths approximately equal.  She's had fusion in her lumbar spine but not to the degree which it will affect the position of her pelvis.  She does however have small proximal femoral metaphysis and canal.  This will be a consideration for a total hip replacement.  At this time I have recommended that she proceed with total hip replacement when she is at her wits end with the pain.  She is close to that point.  The risks and benefits are discussed including but limited to infection or vessel damage dislocation.  Prosthetic longevity also discussed.  I'll have her call Dr. Magnus Ivan to schedule that hip replacement when she wants to get that done.  Top of her also about  the recovery.Diamantina Providence see her back as needed  Follow-Up Instructions: No Follow-up on file.   Orders:  Orders Placed This Encounter  Procedures  . XR HIP UNILAT W OR W/O PELVIS 1V LEFT   No orders of the defined types were placed in this encounter.     Procedures: No procedures performed   Clinical Data: No additional findings.  Objective: Vital Signs: There were no vitals taken for this visit.  Physical Exam   Constitutional: Patient appears well-developed HEENT:  Head: Normocephalic Eyes:EOM are normal Neck: Normal range of motion Cardiovascular: Normal rate Pulmonary/chest: Effort normal Neurologic: Patient is alert Skin: Skin is warm Psychiatric: Patient has normal mood and affect    Ortho Exam orthopedic exam demonstrates Trendelenburg gait to the left.  Diminished and painful range of motion of the left hip with no pain internal/external rotation of the right hip.  She has no nerve retention signs on the left-hand side good ankle dorsi spine flexion quite history strength.  Palpable pedal pulses.  No paresthesias L1 S1.  Not much pain with forward lateral bending.  No real trochanteric tenderness.  Specialty Comments:  No specialty comments available.  Imaging: Xr Hip Unilat W Or W/o Pelvis 1v Left  Result Date: 12/31/2016 AP pelvis and lateral left hip reviewed.  Hardware fusion in the lower lumbar spine is visualized.  Right hip joint space is maintained.  End-stage severe degenerative joint disease is present in the left hip.  Remainder of the bony pelvis is normal.  Sacrum and sacroiliac joints intact.  There are some facet arthritis below the level of the fusion of the lumbar spine.    PMFS History: Patient Active Problem List   Diagnosis Date Noted  . S/P lumbar spinal fusion 10/11/2015  . Sinus tachycardia 09/04/2014  . Hyperlipidemia    Past Medical History:  Diagnosis Date  . Anxiety   . H/O exercise stress test    ETT, without ischemia,  echocardiogram stable   . Hyperlipidemia   . Hypertension   . Osteopenia    by [facility] on January 30, 2009  . Sinus tachycardia     Family History  Problem Relation Age of Onset  . Heart attack Mother   . Lung cancer Father   . Alcoholism Father     Past Surgical History:  Procedure Laterality Date  . BACK SURGERY  06/2014  . LUNG SURGERY Left 2003   saw a spot on her lung , removed it, benign   Social History   Occupational History  . Not on file.   Social History Main Topics  . Smoking status: Former Smoker    Quit date: 05/08/2008  . Smokeless tobacco: Never Used  . Alcohol use Yes     Comment: social  . Drug use: No  . Sexual activity: Not on file

## 2017-02-15 ENCOUNTER — Ambulatory Visit (INDEPENDENT_AMBULATORY_CARE_PROVIDER_SITE_OTHER): Payer: BLUE CROSS/BLUE SHIELD | Admitting: Orthopaedic Surgery

## 2017-02-15 DIAGNOSIS — M25552 Pain in left hip: Secondary | ICD-10-CM | POA: Diagnosis not present

## 2017-02-15 DIAGNOSIS — M1612 Unilateral primary osteoarthritis, left hip: Secondary | ICD-10-CM | POA: Diagnosis not present

## 2017-02-15 MED ORDER — CIPROFLOXACIN HCL 500 MG PO TABS: 500.0000 mg | ORAL_TABLET | Freq: Two times a day (BID) | ORAL | 0 refills | Status: DC

## 2017-02-15 MED ORDER — AMOXICILLIN-POT CLAVULANATE 875-125 MG PO TABS: 1.0000 | ORAL_TABLET | Freq: Two times a day (BID) | ORAL | 0 refills | Status: DC

## 2017-02-15 NOTE — Progress Notes (Signed)
Office Visit Note   Patient: Brandy Meyer           Date of Birth: 30-Nov-1956           MRN: [id number] Visit Date: 02/15/2017              Requested by: Juluis RainierElizabeth Barnes, MD [address] [place], [place] [id number] PCP: Gaye AlkenBARNES,ELIZABETH STEWART, MD   Assessment & Plan: Visit Diagnoses:  1. Pain of left hip joint   2. Unilateral primary osteoarthritis, left hip     Plan: At this point she is tried and failed all forms conservative treatment including rest, ice, heat, time, anti-inflammatories, activity modification, walking with assistive device, and an intra-articular injection. She is tried and failed all these for well over 6 months to a year. Her pain is been going on for very long period time. At this point is detrimentally affected her activities daily living, her quality of life, and her mobility to the point she does wish proceed with a total hip arthroplasty sometime in mid April. We will work on getting that scheduled. We would then see her back at 2 weeks postoperative but no x-rays of be needed. All questions were encouraged and answered. She does have a history of a dog bite on her right forearm of her own dog. She is treated had a primary care clinic with cleaning it and sutures without antibiotics but was eventually placed on antibiotics when things were not improving. She is now off with anabolic specific that area is been red. We'll put her back on Avelox for the next 10 days which will be Augmentin as well as Cipro deceiving get this to clear up for her. I did assess this in the office today as well. Follow-Up Instructions: Return for 2 weeks post-op.   Orders:  No orders of the defined types were placed in this encounter.  Meds ordered this encounter  Medications  . amoxicillin-clavulanate (AUGMENTIN) 875-125 MG tablet    Sig: Take 1 tablet by mouth 2 (two) times daily.    Dispense:  20 tablet    Refill:  0  . ciprofloxacin (CIPRO) 500 MG tablet    Sig: Take 1  tablet (500 mg total) by mouth 2 (two) times daily.    Dispense:  20 tablet    Refill:  0      Procedures: No procedures performed   Clinical Data: No additional findings.   Subjective: No chief complaint on file. The patient is well-known to me. She has severe debilitating osteoarthritis and degenerative joint disease of her left hip. This is been well-documented with x-rays we have on our system. Her pain is daily. Is 10 out of 10. It is detrimentally affected all aspects of her life at this point. She has tried and failed all forms conservative treatment as well. We've showed her hip model in the past explained in detail with the surgery involves. She feels like she is ready to proceed some time in the next month.  HPI  Review of Systems He currently denies any chest pain, headache, shortness of breath, fever, chills, nausea, vomiting.  Objective: Vital Signs: There were no vitals taken for this visit.  Physical Exam She is alert and oriented 3 in no acute distress. Ortho Exam Her head and neck exam as well as her lung and abdomen exam are all normal. Her heart exam is normal as well. Examination of her left hip show severe debilitating pain with any attempts of  internal and external rotation. Her leg is slightly shorter left comparing left and right. Her knee exam is normal. Her back exam is normal. Specialty Comments:  No specialty comments available.  Imaging: No results found. Previous x-rays of her pelvis and hip are reviewed again. I showed these with her and showed her the extent of the profound osteophytes involving her left hip. She has complete loss of the joint space at this point there are cystic and sclerotic changes as well as para-articular osteophytes. The femoral head is flattened as well.  PMFS History: Patient Active Problem List   Diagnosis Date Noted  . S/P lumbar spinal fusion 10/11/2015  . Sinus tachycardia 09/04/2014  . Hyperlipidemia    Past  Medical History:  Diagnosis Date  . Anxiety   . H/O exercise stress test    ETT, without ischemia, echocardiogram stable   . Hyperlipidemia   . Hypertension   . Osteopenia    by DEXA scan at [facility] on January 30, 2009  . Sinus tachycardia     Family History  Problem Relation Age of Onset  . Heart attack Mother   . Lung cancer Father   . Alcoholism Father     Past Surgical History:  Procedure Laterality Date  . BACK SURGERY  06/2014  . LUNG SURGERY Left 2003   saw a spot on her lung , removed it, benign   Social History   Occupational History  . Not on file.   Social History Main Topics  . Smoking status: Former Smoker    Quit date: 05/08/2008  . Smokeless tobacco: Never Used  . Alcohol use Yes     Comment: social  . Drug use: No  . Sexual activity: Not on file

## 2017-03-05 ENCOUNTER — Telehealth (INDEPENDENT_AMBULATORY_CARE_PROVIDER_SITE_OTHER): Payer: Self-pay

## 2017-03-05 NOTE — Telephone Encounter (Signed)
Patient left voice mail requesting handicap sticker.   [phone]

## 2017-03-05 NOTE — Telephone Encounter (Signed)
Is this ok?

## 2017-03-08 ENCOUNTER — Encounter: Payer: Self-pay | Admitting: Interventional Cardiology

## 2017-03-22 ENCOUNTER — Encounter (HOSPITAL_COMMUNITY): Payer: Self-pay

## 2017-03-22 ENCOUNTER — Encounter (HOSPITAL_COMMUNITY)
Admission: RE | Admit: 2017-03-22 | Discharge: 2017-03-22 | Disposition: A | Discharge status: Home / Self Care | Payer: BLUE CROSS/BLUE SHIELD | Source: Ambulatory Visit | Attending: Orthopaedic Surgery | Admitting: Orthopaedic Surgery

## 2017-03-22 ENCOUNTER — Other Ambulatory Visit (INDEPENDENT_AMBULATORY_CARE_PROVIDER_SITE_OTHER): Payer: Self-pay | Admitting: Orthopaedic Surgery

## 2017-03-22 DIAGNOSIS — Z01812 Encounter for preprocedural laboratory examination: Secondary | ICD-10-CM | POA: Diagnosis not present

## 2017-03-22 DIAGNOSIS — M1612 Unilateral primary osteoarthritis, left hip: Secondary | ICD-10-CM | POA: Insufficient documentation

## 2017-03-22 HISTORY — DX: Pain in unspecified joint: M25.50

## 2017-03-22 HISTORY — DX: Unspecified osteoarthritis, unspecified site: M19.90

## 2017-03-22 HISTORY — DX: Pneumonia, unspecified organism: J18.9

## 2017-03-22 HISTORY — DX: Nocturia: R35.1

## 2017-03-22 HISTORY — DX: Personal history of other diseases of the respiratory system: Z87.09

## 2017-03-22 LAB — CBC
HCT: 39 % (ref 36.0–46.0)
Hemoglobin: 13.1 g/dL (ref 12.0–15.0)
MCH: 29.5 pg (ref 26.0–34.0)
MCHC: 33.6 g/dL (ref 30.0–36.0)
MCV: 87.8 fL (ref 78.0–100.0)
PLATELETS: 328 10*3/uL (ref 150–400)
RBC: 4.44 MIL/uL (ref 3.87–5.11)
RDW: 13.1 % (ref 11.5–15.5)
WBC: 10.3 10*3/uL (ref 4.0–10.5)

## 2017-03-22 LAB — BASIC METABOLIC PANEL
Anion gap: 8 (ref 5–15)
BUN: 8 mg/dL (ref 6–20)
CHLORIDE: 98 mmol/L — AB (ref 101–111)
CO2: 27 mmol/L (ref 22–32)
CREATININE: 0.61 mg/dL (ref 0.44–1.00)
Calcium: 9 mg/dL (ref 8.9–10.3)
GFR calc Af Amer: >60 mL/min (ref >60)
GFR calc non Af Amer: >60 mL/min (ref >60)
GLUCOSE: 115 mg/dL — AB (ref 65–99)
Potassium: 3.8 mmol/L (ref 3.5–5.1)
Sodium: 133 mmol/L — ABNORMAL LOW (ref 135–145)

## 2017-03-22 LAB — SURGICAL PCR SCREEN
MRSA, PCR: NEGATIVE
Staphylococcus aureus: NEGATIVE

## 2017-03-22 NOTE — Pre-Procedure Instructions (Signed)
PARTICIA STRAHM  03/22/2017      [facility] - [place], [place] - [phone] [place] [address] Phone: [phone] Fax: [phone]    Your procedure is scheduled on Tues, May 1 @ 1:00 PM  Report to [facility] Admitting at 11:00 AM  Call this number if you have problems the morning of surgery:  [phone]   Remember:  Do not eat food or drink liquids after midnight.  Take these medicines the morning of surgery with A SIP OF WATER Eye Drops and Metoprolol(Toprol)              Stop taking Ibuprofen along with any Vitamins or Herbal Medications. No Goody's,BC's,Aleve,Advil,Motrin,Aspirin,or Fish Oil.    Do not wear jewelry, make-up or nail polish.  Do not wear lotions, powders, perfumes, or deoderant.  Do not shave 48 hours prior to surgery.    Do not bring valuables to the hospital.  [facility] is not responsible for any belongings or valuables.  Contacts, dentures or bridgework may not be worn into surgery.  Leave your suitcase in the car.  After surgery it may be brought to your room.  For patients admitted to the hospital, discharge time will be determined by your treatment team.  Patients discharged the day of surgery will not be allowed to drive home.    [facility]  Before surgery, you can play an important role.  Because skin is not sterile, your skin needs to be as free of germs as possible.  You can reduce the number of germs on you skin by washing with CHG (chlorahexidine gluconate) soap before surgery.  CHG is an antiseptic cleaner which kills germs and bonds with the skin to continue killing germs even after washing.  Please DO NOT use if you have an allergy to CHG or antibacterial soaps.  If your skin becomes reddened/irritated stop using the CHG and inform your nurse when you arrive at Short Stay.  Do not shave (including legs and underarms) for at least 48  hours prior to the first CHG shower.  You may shave your face.  Please follow these instructions carefully:   1.  Shower with CHG Soap the night before surgery and the                                morning of Surgery.  2.  If you choose to wash your hair, wash your hair first as usual with your       normal shampoo.  3.  After you shampoo, rinse your hair and body thoroughly to remove the                      Shampoo.  4.  Use CHG as you would any other liquid soap.  You can apply chg directly       to the skin and wash gently with scrungie or a clean washcloth.  5.  Apply the CHG Soap to your body ONLY FROM THE NECK DOWN.        Do not use on open wounds or open sores.  Avoid contact with your eyes,       ears, mouth and genitals (private parts).  Wash genitals (private parts)       with your normal soap.  6.  Wash thoroughly, paying special attention  to the area where your surgery        will be performed.  7.  Thoroughly rinse your body with warm water from the neck down.  8.  DO NOT shower/wash with your normal soap after using and rinsing off       the CHG Soap.  9.  Pat yourself dry with a clean towel.            10.  Wear clean pajamas.            11.  Place clean sheets on your bed the night of your first shower and do not        sleep with pets.  Day of Surgery  Do not apply any lotions/deoderants the morning of surgery.  Please wear clean clothes to the hospital/surgery center.    Please read over the following fact sheets that you were given. Pain Booklet, Coughing and Deep Breathing, MRSA Information and Surgical Site Infection Prevention

## 2017-03-22 NOTE — Progress Notes (Addendum)
Cardiologist is Dr.Varanasi with last visit in epic from 04/14/16  Medical Md is Dr.Elizabeth Zachery Dauer  Echo done in 2011  Stress test done > 5 yrs ago  Heart cath denies  EKG denies in past yr  CXR denies in past yr

## 2017-03-23 ENCOUNTER — Other Ambulatory Visit (INDEPENDENT_AMBULATORY_CARE_PROVIDER_SITE_OTHER): Payer: Self-pay

## 2017-03-23 ENCOUNTER — Telehealth (INDEPENDENT_AMBULATORY_CARE_PROVIDER_SITE_OTHER): Payer: Self-pay

## 2017-03-23 MED ORDER — TRAMADOL HCL 50 MG PO TABS: ORAL_TABLET | ORAL | 0 refills | Status: DC

## 2017-03-23 NOTE — Telephone Encounter (Signed)
She did actually mean that she would like something from now until her surgery since she can't take her Ibuprofen anymore.

## 2017-03-23 NOTE — Telephone Encounter (Signed)
Call in tylenol#3 or tramadol, 1-2 every 8 hours prn, #60

## 2017-03-23 NOTE — Telephone Encounter (Signed)
Please advise 

## 2017-03-23 NOTE — Telephone Encounter (Signed)
Patient would like to know if she can take some pain medicine before her surgery, which is scheduled for Tuesday May 1st.  CB# is [phone].

## 2017-03-23 NOTE — Telephone Encounter (Signed)
She can take some with just a sip of water that morning

## 2017-03-23 NOTE — Telephone Encounter (Signed)
Called into pharmacy. Patient aware. 

## 2017-03-29 MED ORDER — TRANEXAMIC ACID 1000 MG/10ML IV SOLN
1000.0000 mg | INTRAVENOUS | Status: AC
  Administered 2017-03-30: 1000 mg via INTRAVENOUS
  Filled 2017-03-29: qty 10

## 2017-03-30 ENCOUNTER — Encounter (HOSPITAL_COMMUNITY)
Admission: RE | Disposition: A | Discharge status: Home Health | Payer: Self-pay | Source: Ambulatory Visit | Attending: Orthopaedic Surgery

## 2017-03-30 ENCOUNTER — Inpatient Hospital Stay (HOSPITAL_COMMUNITY): Payer: BLUE CROSS/BLUE SHIELD

## 2017-03-30 ENCOUNTER — Encounter (HOSPITAL_COMMUNITY): Payer: Self-pay | Admitting: *Deleted

## 2017-03-30 ENCOUNTER — Inpatient Hospital Stay (HOSPITAL_COMMUNITY): Payer: BLUE CROSS/BLUE SHIELD | Admitting: Certified Registered Nurse Anesthetist

## 2017-03-30 ENCOUNTER — Inpatient Hospital Stay (HOSPITAL_COMMUNITY)
Admission: RE | Admit: 2017-03-30 | Discharge: 2017-04-01 | DRG: 470 | Disposition: A | Discharge status: Home Health | Payer: BLUE CROSS/BLUE SHIELD | Source: Ambulatory Visit | Attending: Orthopaedic Surgery | Admitting: Orthopaedic Surgery

## 2017-03-30 DIAGNOSIS — Z91013 Allergy to seafood: Secondary | ICD-10-CM

## 2017-03-30 DIAGNOSIS — Z888 Allergy status to other drugs, medicaments and biological substances status: Secondary | ICD-10-CM

## 2017-03-30 DIAGNOSIS — D62 Acute posthemorrhagic anemia: Secondary | ICD-10-CM | POA: Diagnosis not present

## 2017-03-30 DIAGNOSIS — M1612 Unilateral primary osteoarthritis, left hip: Principal | ICD-10-CM

## 2017-03-30 DIAGNOSIS — M8580 Other specified disorders of bone density and structure, unspecified site: Secondary | ICD-10-CM | POA: Diagnosis present

## 2017-03-30 DIAGNOSIS — I10 Essential (primary) hypertension: Secondary | ICD-10-CM | POA: Diagnosis present

## 2017-03-30 DIAGNOSIS — Z880 Allergy status to penicillin: Secondary | ICD-10-CM

## 2017-03-30 DIAGNOSIS — Z981 Arthrodesis status: Secondary | ICD-10-CM | POA: Diagnosis not present

## 2017-03-30 DIAGNOSIS — E785 Hyperlipidemia, unspecified: Secondary | ICD-10-CM | POA: Diagnosis present

## 2017-03-30 DIAGNOSIS — Z96642 Presence of left artificial hip joint: Secondary | ICD-10-CM

## 2017-03-30 DIAGNOSIS — Z79899 Other long term (current) drug therapy: Secondary | ICD-10-CM

## 2017-03-30 DIAGNOSIS — F419 Anxiety disorder, unspecified: Secondary | ICD-10-CM | POA: Diagnosis present

## 2017-03-30 DIAGNOSIS — Z419 Encounter for procedure for purposes other than remedying health state, unspecified: Secondary | ICD-10-CM

## 2017-03-30 HISTORY — PX: TOTAL HIP ARTHROPLASTY: SHX124

## 2017-03-30 SURGERY — ARTHROPLASTY, HIP, TOTAL, ANTERIOR APPROACH
Anesthesia: Spinal | Site: Hip | Laterality: Left

## 2017-03-30 MED ORDER — METHOCARBAMOL 500 MG PO TABS
ORAL_TABLET | ORAL | Status: AC
  Filled 2017-03-30: qty 1

## 2017-03-30 MED ORDER — ASPIRIN 81 MG PO CHEW
81.0000 mg | CHEWABLE_TABLET | Freq: Two times a day (BID) | ORAL | Status: DC
  Administered 2017-03-30 – 2017-04-01 (×4): 81 mg via ORAL
  Filled 2017-03-30 (×4): qty 1

## 2017-03-30 MED ORDER — LIDOCAINE 2% (20 MG/ML) 5 ML SYRINGE
INTRAMUSCULAR | Status: DC | PRN
  Administered 2017-03-30: 100 mg via INTRAVENOUS

## 2017-03-30 MED ORDER — FENTANYL CITRATE (PF) 100 MCG/2ML IJ SOLN
INTRAMUSCULAR | Status: AC
  Filled 2017-03-30: qty 2

## 2017-03-30 MED ORDER — MIDAZOLAM HCL 2 MG/2ML IJ SOLN
INTRAMUSCULAR | Status: AC
  Filled 2017-03-30: qty 2

## 2017-03-30 MED ORDER — PROPOFOL 10 MG/ML IV BOLUS
INTRAVENOUS | Status: AC
  Filled 2017-03-30: qty 20

## 2017-03-30 MED ORDER — METOPROLOL SUCCINATE ER 25 MG PO TB24
25.0000 mg | ORAL_TABLET | Freq: Every day | ORAL | Status: DC
  Administered 2017-03-30 – 2017-04-01 (×3): 25 mg via ORAL
  Filled 2017-03-30 (×3): qty 1

## 2017-03-30 MED ORDER — OXYCODONE HCL 5 MG PO TABS
ORAL_TABLET | ORAL | Status: AC
  Filled 2017-03-30: qty 1

## 2017-03-30 MED ORDER — CLINDAMYCIN PHOSPHATE 600 MG/50ML IV SOLN
600.0000 mg | Freq: Four times a day (QID) | INTRAVENOUS | Status: AC
  Administered 2017-03-30 – 2017-03-31 (×2): 600 mg via INTRAVENOUS
  Filled 2017-03-30 (×2): qty 50

## 2017-03-30 MED ORDER — FLUTICASONE PROPIONATE 50 MCG/ACT NA SUSP
2.0000 | Freq: Every evening | NASAL | Status: DC
  Filled 2017-03-30: qty 16

## 2017-03-30 MED ORDER — SODIUM CHLORIDE 0.9 % IV SOLN: INTRAVENOUS | Status: DC

## 2017-03-30 MED ORDER — ACETAMINOPHEN 650 MG RE SUPP: 650.0000 mg | Freq: Four times a day (QID) | RECTAL | Status: DC | PRN

## 2017-03-30 MED ORDER — PHENOL 1.4 % MT LIQD: 1.0000 | OROMUCOSAL | Status: DC | PRN

## 2017-03-30 MED ORDER — LACTATED RINGERS IV SOLN
INTRAVENOUS | Status: DC
  Administered 2017-03-30: 12:00:00 via INTRAVENOUS

## 2017-03-30 MED ORDER — ATORVASTATIN CALCIUM 20 MG PO TABS
60.0000 mg | ORAL_TABLET | Freq: Every day | ORAL | Status: DC
  Administered 2017-03-30 – 2017-03-31 (×2): 60 mg via ORAL
  Filled 2017-03-30 (×2): qty 1

## 2017-03-30 MED ORDER — PHENYLEPHRINE 40 MCG/ML (10ML) SYRINGE FOR IV PUSH (FOR BLOOD PRESSURE SUPPORT)
PREFILLED_SYRINGE | INTRAVENOUS | Status: DC | PRN
  Administered 2017-03-30: 80 ug via INTRAVENOUS
  Administered 2017-03-30: 40 ug via INTRAVENOUS
  Administered 2017-03-30: 80 ug via INTRAVENOUS
  Administered 2017-03-30 (×2): 40 ug via INTRAVENOUS

## 2017-03-30 MED ORDER — ACETAMINOPHEN 325 MG PO TABS
650.0000 mg | ORAL_TABLET | Freq: Four times a day (QID) | ORAL | Status: DC | PRN
  Administered 2017-03-31: 650 mg via ORAL
  Filled 2017-03-30: qty 2

## 2017-03-30 MED ORDER — METHOCARBAMOL 1000 MG/10ML IJ SOLN
500.0000 mg | Freq: Four times a day (QID) | INTRAVENOUS | Status: DC | PRN
  Filled 2017-03-30: qty 5

## 2017-03-30 MED ORDER — SODIUM CHLORIDE 0.9 % IR SOLN
Status: DC | PRN
  Administered 2017-03-30: 3000 mL

## 2017-03-30 MED ORDER — MENTHOL 3 MG MT LOZG: 1.0000 | LOZENGE | OROMUCOSAL | Status: DC | PRN

## 2017-03-30 MED ORDER — MIDAZOLAM HCL 5 MG/5ML IJ SOLN
INTRAMUSCULAR | Status: DC | PRN
  Administered 2017-03-30: 2 mg via INTRAVENOUS

## 2017-03-30 MED ORDER — METHOCARBAMOL 500 MG PO TABS
500.0000 mg | ORAL_TABLET | Freq: Four times a day (QID) | ORAL | Status: DC | PRN
  Administered 2017-03-30 – 2017-04-01 (×5): 500 mg via ORAL
  Filled 2017-03-30 (×7): qty 1

## 2017-03-30 MED ORDER — HYDROMORPHONE HCL 1 MG/ML IJ SOLN
1.0000 mg | INTRAMUSCULAR | Status: DC | PRN
  Administered 2017-03-30 (×2): 1 mg via INTRAVENOUS
  Filled 2017-03-30 (×2): qty 1

## 2017-03-30 MED ORDER — ADULT MULTIVITAMIN W/MINERALS CH
1.0000 | ORAL_TABLET | Freq: Every evening | ORAL | Status: DC
  Administered 2017-03-31: 1 via ORAL
  Filled 2017-03-30: qty 1

## 2017-03-30 MED ORDER — ONDANSETRON HCL 4 MG/2ML IJ SOLN
INTRAMUSCULAR | Status: AC
  Filled 2017-03-30: qty 2

## 2017-03-30 MED ORDER — DIPHENHYDRAMINE HCL 12.5 MG/5ML PO ELIX
12.5000 mg | ORAL_SOLUTION | ORAL | Status: DC | PRN
  Administered 2017-03-31: 25 mg via ORAL
  Filled 2017-03-30: qty 10

## 2017-03-30 MED ORDER — DOCUSATE SODIUM 100 MG PO CAPS
100.0000 mg | ORAL_CAPSULE | Freq: Two times a day (BID) | ORAL | Status: DC
  Administered 2017-03-30 – 2017-03-31 (×3): 100 mg via ORAL
  Filled 2017-03-30 (×5): qty 1

## 2017-03-30 MED ORDER — CELECOXIB 200 MG PO CAPS
200.0000 mg | ORAL_CAPSULE | Freq: Two times a day (BID) | ORAL | Status: DC
  Administered 2017-03-30 – 2017-04-01 (×4): 200 mg via ORAL
  Filled 2017-03-30 (×4): qty 1

## 2017-03-30 MED ORDER — 0.9 % SODIUM CHLORIDE (POUR BTL) OPTIME
TOPICAL | Status: DC | PRN
  Administered 2017-03-30: 1000 mL

## 2017-03-30 MED ORDER — ONDANSETRON HCL 4 MG/2ML IJ SOLN
INTRAMUSCULAR | Status: DC | PRN
  Administered 2017-03-30: 4 mg via INTRAVENOUS

## 2017-03-30 MED ORDER — PROPOFOL 10 MG/ML IV BOLUS
INTRAVENOUS | Status: DC | PRN
  Administered 2017-03-30: 150 mg via INTRAVENOUS

## 2017-03-30 MED ORDER — PHENYLEPHRINE 40 MCG/ML (10ML) SYRINGE FOR IV PUSH (FOR BLOOD PRESSURE SUPPORT)
PREFILLED_SYRINGE | INTRAVENOUS | Status: AC
  Filled 2017-03-30: qty 10

## 2017-03-30 MED ORDER — METOCLOPRAMIDE HCL 5 MG/ML IJ SOLN: 5.0000 mg | Freq: Three times a day (TID) | INTRAMUSCULAR | Status: DC | PRN

## 2017-03-30 MED ORDER — FENTANYL CITRATE (PF) 250 MCG/5ML IJ SOLN
INTRAMUSCULAR | Status: AC
  Filled 2017-03-30: qty 5

## 2017-03-30 MED ORDER — METOCLOPRAMIDE HCL 5 MG PO TABS: 5.0000 mg | ORAL_TABLET | Freq: Three times a day (TID) | ORAL | Status: DC | PRN

## 2017-03-30 MED ORDER — LACTATED RINGERS IV SOLN
INTRAVENOUS | Status: DC | PRN
  Administered 2017-03-30 (×2): via INTRAVENOUS

## 2017-03-30 MED ORDER — ONDANSETRON HCL 4 MG PO TABS: 4.0000 mg | ORAL_TABLET | Freq: Four times a day (QID) | ORAL | Status: DC | PRN

## 2017-03-30 MED ORDER — SUGAMMADEX SODIUM 200 MG/2ML IV SOLN
INTRAVENOUS | Status: DC | PRN
  Administered 2017-03-30: 200 mg via INTRAVENOUS

## 2017-03-30 MED ORDER — FENTANYL CITRATE (PF) 100 MCG/2ML IJ SOLN
INTRAMUSCULAR | Status: DC | PRN
  Administered 2017-03-30 (×2): 50 ug via INTRAVENOUS
  Administered 2017-03-30: 100 ug via INTRAVENOUS
  Administered 2017-03-30 (×5): 50 ug via INTRAVENOUS

## 2017-03-30 MED ORDER — CLINDAMYCIN PHOSPHATE 900 MG/50ML IV SOLN
900.0000 mg | INTRAVENOUS | Status: AC
  Administered 2017-03-30: 900 mg via INTRAVENOUS
  Filled 2017-03-30: qty 50

## 2017-03-30 MED ORDER — FENTANYL CITRATE (PF) 100 MCG/2ML IJ SOLN
25.0000 ug | INTRAMUSCULAR | Status: DC | PRN
  Administered 2017-03-30: 50 ug via INTRAVENOUS
  Administered 2017-03-30 (×2): 25 ug via INTRAVENOUS
  Administered 2017-03-30: 50 ug via INTRAVENOUS

## 2017-03-30 MED ORDER — OXYCODONE HCL 5 MG PO TABS
5.0000 mg | ORAL_TABLET | ORAL | Status: DC | PRN
  Administered 2017-03-30 (×2): 10 mg via ORAL
  Administered 2017-03-30: 5 mg via ORAL
  Administered 2017-03-31 – 2017-04-01 (×8): 10 mg via ORAL
  Filled 2017-03-30 (×10): qty 2

## 2017-03-30 MED ORDER — DEXAMETHASONE SODIUM PHOSPHATE 10 MG/ML IJ SOLN
INTRAMUSCULAR | Status: DC | PRN
  Administered 2017-03-30: 10 mg via INTRAVENOUS

## 2017-03-30 MED ORDER — DEXAMETHASONE SODIUM PHOSPHATE 10 MG/ML IJ SOLN
INTRAMUSCULAR | Status: AC
  Filled 2017-03-30: qty 1

## 2017-03-30 MED ORDER — ROCURONIUM BROMIDE 10 MG/ML (PF) SYRINGE
PREFILLED_SYRINGE | INTRAVENOUS | Status: DC | PRN
  Administered 2017-03-30: 50 mg via INTRAVENOUS
  Administered 2017-03-30: 20 mg via INTRAVENOUS

## 2017-03-30 MED ORDER — ALUM & MAG HYDROXIDE-SIMETH 200-200-20 MG/5ML PO SUSP: 30.0000 mL | ORAL | Status: DC | PRN

## 2017-03-30 MED ORDER — ONDANSETRON HCL 4 MG/2ML IJ SOLN: 4.0000 mg | Freq: Four times a day (QID) | INTRAMUSCULAR | Status: DC | PRN

## 2017-03-30 SURGICAL SUPPLY — 52 items
BENZOIN TINCTURE PRP APPL 2/3 (GAUZE/BANDAGES/DRESSINGS) ×3 IMPLANT
BLADE SAW SGTL 18X1.27X75 (BLADE) ×2 IMPLANT
BLADE SAW SGTL 18X1.27X75MM (BLADE) ×1
CAPT HIP TOTAL 2 ×3 IMPLANT
CELLS DAT CNTRL 66122 CELL SVR (MISCELLANEOUS) ×1 IMPLANT
CHLORAPREP W/TINT 26ML (MISCELLANEOUS) ×3 IMPLANT
CLOSURE WOUND 1/2 X4 (GAUZE/BANDAGES/DRESSINGS) ×2
COVER SURGICAL LIGHT HANDLE (MISCELLANEOUS) ×3 IMPLANT
DRAPE C-ARM 42X72 X-RAY (DRAPES) ×3 IMPLANT
DRAPE STERI IOBAN 125X83 (DRAPES) ×3 IMPLANT
DRAPE SURG ISO 125X83 STRL (DRAPES) ×3 IMPLANT
DRAPE U-SHAPE 47X51 STRL (DRAPES) ×9 IMPLANT
DRSG AQUACEL AG ADV 3.5X10 (GAUZE/BANDAGES/DRESSINGS) ×3 IMPLANT
ELECT BLADE 4.0 EZ CLEAN MEGAD (MISCELLANEOUS) ×3
ELECT BLADE 6.5 EXT (BLADE) IMPLANT
ELECT REM PT RETURN 9FT ADLT (ELECTROSURGICAL) ×3
ELECTRODE BLDE 4.0 EZ CLN MEGD (MISCELLANEOUS) ×1 IMPLANT
ELECTRODE REM PT RTRN 9FT ADLT (ELECTROSURGICAL) ×1 IMPLANT
FACESHIELD WRAPAROUND (MASK) ×6 IMPLANT
GLOVE BIOGEL PI IND STRL 7.0 (GLOVE) ×2 IMPLANT
GLOVE BIOGEL PI IND STRL 8 (GLOVE) ×2 IMPLANT
GLOVE BIOGEL PI INDICATOR 7.0 (GLOVE) ×4
GLOVE BIOGEL PI INDICATOR 8 (GLOVE) ×4
GLOVE ECLIPSE 8.0 STRL XLNG CF (GLOVE) ×3 IMPLANT
GLOVE ORTHO TXT STRL SZ7.5 (GLOVE) ×6 IMPLANT
GLOVE SURG SS PI 6.5 STRL IVOR (GLOVE) ×9 IMPLANT
GLOVE SURG SS PI 7.0 STRL IVOR (GLOVE) ×3 IMPLANT
GOWN STRL REUS W/ TWL LRG LVL3 (GOWN DISPOSABLE) ×2 IMPLANT
GOWN STRL REUS W/ TWL XL LVL3 (GOWN DISPOSABLE) ×2 IMPLANT
GOWN STRL REUS W/TWL LRG LVL3 (GOWN DISPOSABLE) ×4
GOWN STRL REUS W/TWL XL LVL3 (GOWN DISPOSABLE) ×4
HANDPIECE INTERPULSE COAX TIP (DISPOSABLE) ×2
KIT BASIN OR (CUSTOM PROCEDURE TRAY) ×3 IMPLANT
KIT ROOM TURNOVER OR (KITS) ×3 IMPLANT
MANIFOLD NEPTUNE II (INSTRUMENTS) ×3 IMPLANT
NS IRRIG 1000ML POUR BTL (IV SOLUTION) ×3 IMPLANT
PACK TOTAL JOINT (CUSTOM PROCEDURE TRAY) ×3 IMPLANT
PAD ARMBOARD 7.5X6 YLW CONV (MISCELLANEOUS) ×3 IMPLANT
RTRCTR WOUND ALEXIS 18CM MED (MISCELLANEOUS) ×3
SET HNDPC FAN SPRY TIP SCT (DISPOSABLE) ×1 IMPLANT
STRIP CLOSURE SKIN 1/2X4 (GAUZE/BANDAGES/DRESSINGS) ×4 IMPLANT
SUT ETHIBOND NAB CT1 #1 30IN (SUTURE) ×3 IMPLANT
SUT MNCRL AB 4-0 PS2 18 (SUTURE) ×3 IMPLANT
SUT VIC AB 0 CT1 27 (SUTURE) ×2
SUT VIC AB 0 CT1 27XBRD ANBCTR (SUTURE) ×1 IMPLANT
SUT VIC AB 1 CT1 27 (SUTURE) ×2
SUT VIC AB 1 CT1 27XBRD ANBCTR (SUTURE) ×1 IMPLANT
SUT VIC AB 2-0 CT1 27 (SUTURE) ×2
SUT VIC AB 2-0 CT1 TAPERPNT 27 (SUTURE) ×1 IMPLANT
TOWEL OR 17X24 6PK STRL BLUE (TOWEL DISPOSABLE) ×3 IMPLANT
TOWEL OR 17X26 10 PK STRL BLUE (TOWEL DISPOSABLE) ×3 IMPLANT
TRAY FOLEY CATH 14FR (SET/KITS/TRAYS/PACK) ×3 IMPLANT

## 2017-03-30 NOTE — Transfer of Care (Signed)
Immediate Anesthesia Transfer of Care Note  Patient: Brandy Meyer  Procedure(s) Performed: Procedure(s): LEFT TOTAL HIP ARTHROPLASTY ANTERIOR APPROACH (Left)  Patient Location: PACU  Anesthesia Type:General  Level of Consciousness: awake, alert , oriented and patient cooperative  Airway & Oxygen Therapy: Patient Spontanous Breathing and Patient connected to nasal cannula oxygen  Post-op Assessment: Report given to RN, Post -op Vital signs reviewed and stable and Patient moving all extremities X 4  Post vital signs: Reviewed and stable  Last Vitals:  Vitals:   03/30/17 1116 03/30/17 1534  BP: (!) 151/92   Pulse: 87 82  Resp: 18 14  Temp: 37.2 C 36.6 C    Last Pain:  Vitals:   03/30/17 1145  TempSrc:   PainSc: 6       Patients Stated Pain Goal: 1 (03/30/17 1145)  Complications: No apparent anesthesia complications

## 2017-03-30 NOTE — H&P (Signed)
TOTAL HIP ADMISSION H&P  Patient is admitted for left total hip arthroplasty.  Subjective:  Chief Complaint: left hip pain  HPI: Brandy Meyer, 61 y.o. female, has a history of pain and functional disability in the left hip(s) due to arthritis and patient has failed non-surgical conservative treatments for greater than 12 weeks to include NSAID's and/or analgesics, corticosteriod injections, flexibility and strengthening excercises, use of assistive devices and activity modification.  Onset of symptoms was gradual starting 2 years ago with gradually worsening course since that time.The patient noted no past surgery on the left hip(s).  Patient currently rates pain in the left hip at 10 out of 10 with activity. Patient has night pain, worsening of pain with activity and weight bearing, trendelenberg gait, pain that interfers with activities of daily living, pain with passive range of motion and crepitus. Patient has evidence of subchondral cysts, subchondral sclerosis, periarticular osteophytes and joint space narrowing by imaging studies. This condition presents safety issues increasing the risk of falls.  There is no current active infection.  Patient Active Problem List   Diagnosis Date Noted  . Unilateral primary osteoarthritis, left hip 03/30/2017  . S/P lumbar spinal fusion 10/11/2015  . Sinus tachycardia 09/04/2014  . Hyperlipidemia    Past Medical History:  Diagnosis Date  . Anxiety   . Arthritis   . History of bronchitis    15 yrs ago  . Hyperlipidemia    takes Atorvastatin daily  . Hypertension    takes Metoprolol daily  . Joint pain   . Nocturia   . Osteopenia    by [facility] at [facility] on [facility] 30, [facility]  . Pneumonia    hx of-20 yrs ago    Past Surgical History:  Procedure Laterality Date  . BACK SURGERY     x  2  . CESAREAN SECTION    . COLONOSCOPY    . LUNG SURGERY Left 2003   saw a spot on her lung , removed it, benign    Prescriptions Prior to Admission   Medication Sig Dispense Refill Last Dose  . Artificial Tear Solution (SOOTHE XP OP) Place 1 drop into both eyes every 8 (eight) hours as needed (dry eyes).   03/30/2017 at 01000  . atorvastatin (LIPITOR) 40 MG tablet Take 60 mg by mouth daily at 6 PM.    03/29/2017 at Unknown time  . celecoxib (CELEBREX) 200 MG capsule Take 1 capsule (200 mg total) by mouth daily. (Patient taking differently: Take 200 mg by mouth 2 (two) times daily. ) 60 capsule 3 Past Week at Unknown time  . fluticasone (FLONASE) 50 MCG/ACT nasal spray Place 2 sprays into both nostrils every evening.   03/29/2017 at Unknown time  . ibuprofen (ADVIL,MOTRIN) 200 MG tablet Take 800 mg by mouth every 4 (four) hours as needed for moderate pain.    Past Week at Unknown time  . metoprolol succinate (TOPROL-XL) 25 MG 24 hr tablet TAKE 1 TABLET (25 MG TOTAL) BY MOUTH DAILY. (Patient taking differently: TAKE 1 TABLET (25 MG TOTAL) BY MOUTH DAILY IN THE EVENING) 30 tablet 11 03/29/2017 at Unknown time  . Multiple Vitamin (MULTIVITAMIN WITH MINERALS) TABS tablet Take 1 tablet by mouth every evening.   Past Week at Unknown time  . traMADol (ULTRAM) 50 MG tablet 1-2 every 8 hrs prn 60 tablet 0 03/29/2017 at Unknown time  . amoxicillin-clavulanate (AUGMENTIN) 875-125 MG tablet Take 1 tablet by mouth 2 (two) times daily. (Patient not taking: Reported on  03/19/2017) 20 tablet 0 Not Taking at Unknown time  . ciprofloxacin (CIPRO) 500 MG tablet Take 1 tablet (500 mg total) by mouth 2 (two) times daily. (Patient not taking: Reported on 03/19/2017) 20 tablet 0 Not Taking at Unknown time   Allergies  Allergen Reactions  . Shellfish Allergy Hives, Shortness Of Breath and Swelling    Crab meat and lobster.  Can tolerate small doses of shrimp.   . Effexor [Venlafaxine] Other (See Comments)    Sleepy/hyper  . Penicillins Other (See Comments)    As a child. But recently tolerated Augmentin. Has patient had a PCN reaction causing immediate rash,  facial/tongue/throat swelling, SOB or lightheadedness with hypotension: Unknown Has patient had a PCN reaction causing severe rash involving mucus membranes or skin necrosis: Unknown Has patient had a PCN reaction that required hospitalization: Yes Has patient had a PCN reaction occurring within the last 10 years: No If all of the above answers are "NO", then may proceed with Cephalosporin use.     Social History  Substance Use Topics  . Smoking status: Former Games developer  . Smokeless tobacco: Never Used     Comment: quit smoking 11 yrs ago  . Alcohol use Yes     Comment: social    Family History  Problem Relation Age of Onset  . Heart attack Mother   . Lung cancer Father   . Alcoholism Father      Review of Systems  Musculoskeletal: Positive for joint pain.  All other systems reviewed and are negative.   Objective:  Physical Exam  Constitutional: She is oriented to person, place, and time. She appears well-developed and well-nourished.  HENT:  Head: Normocephalic and atraumatic.  Eyes: EOM are normal. Pupils are equal, round, and reactive to light.  Neck: Normal range of motion. Neck supple.  Cardiovascular: Normal rate and regular rhythm.   Respiratory: Effort normal and breath sounds normal.  GI: Soft. Bowel sounds are normal.  Musculoskeletal:       Left hip: She exhibits decreased range of motion, decreased strength, tenderness and bony tenderness.  Neurological: She is alert and oriented to person, place, and time.  Skin: Skin is warm and dry.  Psychiatric: She has a normal mood and affect.    Vital signs in last 24 hours: Temp:  [98.9 F (37.2 C)] 98.9 F (37.2 C) (05/01 1116) Pulse Rate:  [87] 87 (05/01 1116) Resp:  [18] 18 (05/01 1116) BP: (151)/(92) 151/92 (05/01 1116) SpO2:  [96 %] 96 % (05/01 1116) Weight:  [132 lb 4 oz (60 kg)] 132 lb 4 oz (60 kg) (05/01 1116)  Labs:   Estimated body mass index is 24.99 kg/m as calculated from the following:    Height as of this encounter:  (1.549 m).   Weight as of this encounter: 132 lb 4 oz (60 kg).   Imaging Review Plain radiographs demonstrate severe degenerative joint disease of the left hip(s). The bone quality appears to be good for age and reported activity level.  Assessment/Plan:  End stage arthritis, left hip(s)  The patient history, physical examination, clinical judgement of the provider and imaging studies are consistent with end stage degenerative joint disease of the left hip(s) and total hip arthroplasty is deemed medically necessary. The treatment options including medical management, injection therapy, arthroscopy and arthroplasty were discussed at length. The risks and benefits of total hip arthroplasty were presented and reviewed. The risks due to aseptic loosening, infection, stiffness, dislocation/subluxation,  thromboembolic complications and other  imponderables were discussed.  The patient acknowledged the explanation, agreed to proceed with the plan and consent was signed. Patient is being admitted for inpatient treatment for surgery, pain control, PT, OT, prophylactic antibiotics, VTE prophylaxis, progressive ambulation and ADL's and discharge planning.The patient is planning to be discharged home with home health services

## 2017-03-30 NOTE — Anesthesia Procedure Notes (Signed)
Procedure Name: Intubation Date/Time: 03/30/2017 1:40 PM Performed by: Annabelle Harman A Pre-anesthesia Checklist: Patient identified, Emergency Drugs available, Suction available and Patient being monitored Patient Re-evaluated:Patient Re-evaluated prior to inductionOxygen Delivery Method: Circle system utilized Preoxygenation: Pre-oxygenation with 100% oxygen Intubation Type: IV induction Ventilation: Mask ventilation without difficulty Laryngoscope Size: Miller and 2 Grade View: Grade I Tube type: Oral Tube size: 7.0 mm Number of attempts: 1 Airway Equipment and Method: Stylet Placement Confirmation: ETT inserted through vocal cords under direct vision,  positive ETCO2 and breath sounds checked- equal and bilateral Secured at: 22 cm Tube secured with: Tape Dental Injury: Teeth and Oropharynx as per pre-operative assessment

## 2017-03-30 NOTE — Brief Op Note (Signed)
03/30/2017  3:18 PM  PATIENT:  Richardo Hanks  61 y.o. female  PRE-OPERATIVE DIAGNOSIS:  severe osteoarthritis left hip  POST-OPERATIVE DIAGNOSIS:  severe osteoarthritis left hip  PROCEDURE:  Procedure(s): LEFT TOTAL HIP ARTHROPLASTY ANTERIOR APPROACH (Left)  SURGEON:  Surgeon(s) and Role:    * Kathryne Hitch, MD - Primary  PHYSICIAN ASSISTANT: Rexene Edison, PA-C  ANESTHESIA:   general  EBL:  Total I/O In: 1000 [I.V.:1000] Out: 525 [Urine:75; Blood:450]  COUNTS:  YES  DICTATION: .Other Dictation: Dictation Number [id number]  PLAN OF CARE: Admit to inpatient   PATIENT DISPOSITION:  PACU - hemodynamically stable.   Delay start of Pharmacological VTE agent (>24hrs) due to surgical blood loss or risk of bleeding: no

## 2017-03-30 NOTE — Anesthesia Postprocedure Evaluation (Signed)
Anesthesia Post Note  Patient: Brandy Meyer  Procedure(s) Performed: Procedure(s) (LRB): LEFT TOTAL HIP ARTHROPLASTY ANTERIOR APPROACH (Left)  Patient location during evaluation: PACU Anesthesia Type: General Level of consciousness: awake Pain management: pain level controlled Vital Signs Assessment: post-procedure vital signs reviewed and stable Cardiovascular status: stable Anesthetic complications: no       Last Vitals:  Vitals:   03/30/17 1116 03/30/17 1534  BP: (!) 151/92   Pulse: 87 82  Resp: 18 14  Temp: 37.2 C 36.6 C    Last Pain:  Vitals:   03/30/17 1145  TempSrc:   PainSc: 6                  Prescious Hurless

## 2017-03-30 NOTE — Anesthesia Preprocedure Evaluation (Addendum)
Anesthesia Evaluation  Patient identified by MRN, date of birth, ID band Patient awake    Reviewed: Allergy & Precautions, NPO status , reviewed documented beta blocker date and time   Airway Mallampati: II  TM Distance: >3 FB     Dental   Pulmonary pneumonia, former smoker,    breath sounds clear to auscultation       Cardiovascular hypertension,  Rhythm:Regular Rate:Normal     Neuro/Psych    GI/Hepatic negative GI ROS, Neg liver ROS,   Endo/Other  negative endocrine ROS  Renal/GU negative Renal ROS     Musculoskeletal  (+) Arthritis ,   Abdominal   Peds  Hematology   Anesthesia Other Findings   Reproductive/Obstetrics                             Anesthesia Physical Anesthesia Plan  ASA: III  Anesthesia Plan: General   Post-op Pain Management:    Induction: Intravenous  Airway Management Planned: Oral ETT  Additional Equipment:   Intra-op Plan:   Post-operative Plan:   Informed Consent: I have reviewed the patients History and Physical, chart, labs and discussed the procedure including the risks, benefits and alternatives for the proposed anesthesia with the patient or authorized representative who has indicated his/her understanding and acceptance.   Dental advisory given  Plan Discussed with: CRNA, Anesthesiologist and Surgeon  Anesthesia Plan Comments:        Anesthesia Quick Evaluation

## 2017-03-31 ENCOUNTER — Encounter (HOSPITAL_COMMUNITY): Payer: Self-pay | Admitting: Orthopaedic Surgery

## 2017-03-31 LAB — CBC
HCT: 28.1 % — ABNORMAL LOW (ref 36.0–46.0)
HEMOGLOBIN: 9.6 g/dL — AB (ref 12.0–15.0)
MCH: 30.1 pg (ref 26.0–34.0)
MCHC: 34.2 g/dL (ref 30.0–36.0)
MCV: 88.1 fL (ref 78.0–100.0)
PLATELETS: 291 10*3/uL (ref 150–400)
RBC: 3.19 MIL/uL — AB (ref 3.87–5.11)
RDW: 13.3 % (ref 11.5–15.5)
WBC: 11.4 10*3/uL — AB (ref 4.0–10.5)

## 2017-03-31 LAB — BASIC METABOLIC PANEL
ANION GAP: 9 (ref 5–15)
BUN: 7 mg/dL (ref 6–20)
CHLORIDE: 96 mmol/L — AB (ref 101–111)
CO2: 26 mmol/L (ref 22–32)
Calcium: 8.5 mg/dL — ABNORMAL LOW (ref 8.9–10.3)
Creatinine, Ser: 0.66 mg/dL (ref 0.44–1.00)
GFR calc Af Amer: >60 mL/min (ref >60)
GLUCOSE: 140 mg/dL — AB (ref 65–99)
POTASSIUM: 4 mmol/L (ref 3.5–5.1)
Sodium: 131 mmol/L — ABNORMAL LOW (ref 135–145)

## 2017-03-31 NOTE — Evaluation (Signed)
Physical Therapy Evaluation Patient Details Name: Brandy Meyer MRN: [id number] DOB: 08-22-1956 Today's Date: 03/31/2017   History of Present Illness  Pt is a 61 yo female with c/o L hip pain secondary to end stage L hip OA., s/p L THA 03/31/17, PMH significant for HTN, OA, Osteopenia.   Clinical Impression  Pt is s/p THA resulting in the deficits listed below (see PT Problem List). Pt is minA for bed mobility, and min guard for transfers with RW and ambulation of 80 feet with RW.   Pt will benefit from skilled PT to increase their independence and safety with mobility to allow discharge to the venue listed below.      Follow Up Recommendations Home health PT;Supervision/Assistance - 24 hour    Equipment Recommendations   (pt has equipment from back surgery)    Recommendations for Other Services       Precautions / Restrictions Precautions Precautions: Anterior Hip Precaution Booklet Issued: Yes (comment) Restrictions Weight Bearing Restrictions: Yes LLE Weight Bearing: Weight bearing as tolerated      Mobility  Bed Mobility Overal bed mobility: Needs Assistance Bed Mobility: Supine to Sit     Supine to sit: Min assist;HOB elevated     General bed mobility comments: minA for pad scoot of hips to EoB  Transfers Overall transfer level: Needs assistance Equipment used: Rolling walker (2 wheeled) Transfers: Sit to/from Stand Sit to Stand: Min guard         General transfer comment: pt able to sit<>stand from bed, toilet and recliner with vc for hand placement and controlled descent  Ambulation/Gait Ambulation/Gait assistance: Min assist Ambulation Distance (Feet): 80 Feet Assistive device: Rolling walker (2 wheeled) Gait Pattern/deviations: Step-to pattern;Decreased step length - right;Decreased step length - left;Decreased stance time - left;Decreased weight shift to left;Antalgic;Shuffle;Trunk flexed Gait velocity: slowed Gait velocity interpretation: Below normal  speed for age/gender General Gait Details: pt ambulates with slow, steady cadence, no LoB, no buckling, vc for upright posture, looking up and out and matching step length      Balance Overall balance assessment: Needs assistance Sitting-balance support: Feet unsupported;No upper extremity supported Sitting balance-Leahy Scale: Good Sitting balance - Comments: able to sit on 3 in 1 over toilet without feet on ground and no UE support with no LoB   Standing balance support: Single extremity supported;During functional activity Standing balance-Leahy Scale: Fair Standing balance comment: pt able to balance in standing with single UE to wash hands                             Pertinent Vitals/Pain Pain Assessment: 0-10 Pain Score: 7  Pain Location: L hip surgical site Pain Descriptors / Indicators: Constant;Aching;Sore Pain Intervention(s): Patient requesting pain meds-RN notified;Monitored during session;Limited activity within patient's tolerance;Ice applied  VSS    Home Living Family/patient expects to be discharged to:: Private residence Living Arrangements: Spouse/significant other Available Help at Discharge: Available 24 hours/day;Family Type of Home: House Home Access: Level entry     Home Layout: One level Home Equipment: Grab bars - tub/shower;Walker - 2 wheels;Shower seat - built in (3 wheeled walker)      Prior Function Level of Independence: Independent         Comments: works, Tourist information centre manager, Science writer        Extremity/Trunk Assessment   Upper Extremity Assessment Upper Extremity Assessment: Defer to OT evaluation    Lower Extremity Assessment  Lower Extremity Assessment: LLE deficits/detail LLE Deficits / Details: L hip strength and ROM limited by surgery, L knee and ankle grossly 4/5 and ROM WFL LLE: Unable to fully assess due to pain LLE Sensation:  (intact)    Cervical / Trunk Assessment Cervical / Trunk  Assessment: Normal  Communication   Communication: No difficulties  Cognition Arousal/Alertness: Awake/alert Behavior During Therapy: WFL for tasks assessed/performed Overall Cognitive Status: Within Functional Limits for tasks assessed                                           Exercises Total Joint Exercises Ankle Circles/Pumps: AROM;Both;20 reps;Seated Quad Sets: AROM;Both;10 reps;Seated   Assessment/Plan    PT Assessment Patient needs continued PT services  PT Problem List Decreased strength;Decreased range of motion;Decreased activity tolerance;Decreased mobility;Pain       PT Treatment Interventions DME instruction;Gait training;Functional mobility training;Therapeutic activities;Therapeutic exercise;Patient/family education    PT Goals (Current goals can be found in the Care Plan section)  Acute Rehab PT Goals Patient Stated Goal: go home PT Goal Formulation: With patient Time For Goal Achievement: 04/07/17 Potential to Achieve Goals: Good    Frequency 7X/week   Barriers to discharge           AM-PAC PT "6 Clicks" Daily Activity  Outcome Measure Difficulty turning over in bed (including adjusting bedclothes, sheets and blankets)?: Total Difficulty moving from lying on back to sitting on the side of the bed? : Total Difficulty sitting down on and standing up from a chair with arms (e.g., wheelchair, bedside commode, etc,.)?: Total Help needed moving to and from a bed to chair (including a wheelchair)?: A Little Help needed walking in hospital room?: A Little Help needed climbing 3-5 steps with a railing? : A Lot 6 Click Score: 11    End of Session Equipment Utilized During Treatment: Gait belt Activity Tolerance: Patient tolerated treatment well Patient left: in chair;with call bell/phone within reach Nurse Communication: Mobility status;Patient requests pain meds;Weight bearing status PT Visit Diagnosis: Other abnormalities of gait and  mobility (R26.89);Muscle weakness (generalized) (M62.81);Pain Pain - Right/Left: Left Pain - part of body: Hip    Time: 0920-1013 PT Time Calculation (min) (ACUTE ONLY): 53 min   Charges:   PT Evaluation $PT Eval Low Complexity: 1 Procedure PT Treatments $Gait Training: 8-22 mins $Therapeutic Exercise: 8-22 mins $Therapeutic Activity: 8-22 mins   PT G Codes:        Husam Hohn B. Beverely Risen PT, DPT Acute Rehabilitation  [phone] Pager [phone]    Elon Alas Fleet 03/31/2017, 10:26 AM

## 2017-03-31 NOTE — Care Management Note (Signed)
Case Management Note  Patient Details  Name: DESERAI CANSLER MRN: [id number] Date of Birth: August 30, 1956  Subjective/Objective: 61 yr old female s/p left total hip arthroplasty.                   Action/Plan: Case manager spoke with patient concerning home health and DME needs. Patient was preoperatively setup with [facility], no changes. She has RW and 3in1 from previous surgeries. Patient will have family support at discharge.   Expected Discharge Date: 04/01/17                 Expected Discharge Plan:  Home w Home Health Services  In-House Referral:  NA  Discharge planning Services  CM Consult  Post Acute Care Choice:  Home Health Choice offered to:  Patient  DME Arranged:   (has RW and 3in1 from previous surgeries) DME Agency:  NA  HH Arranged:  PT HH Agency:  NA  Status of Service:  Completed, signed off  If discussed at Long Length of Stay Meetings, dates discussed:    Additional Comments:  Durenda Guthrie, RN 03/31/2017, 12:33 PM

## 2017-03-31 NOTE — Progress Notes (Signed)
Physical Therapy Treatment Patient Details Name: Brandy Meyer MRN: [id number] DOB: May 27, 1956 Today's Date: 03/31/2017    History of Present Illness Pt is a 61 yo female with c/o L hip pain secondary to end stage L hip OA., s/p L THA 03/31/17, PMH significant for HTN, OA, Osteopenia.     PT Comments    Pt is making good progress towards her goals. Pt currently min guard for bed mobility and transfers using RW and minA for ambulation of 150 feet with RW. Pt requires continued skilled PT to progress gait training and to improve LE stregth, ROM and endurance to safely navigate her home environment at discharge.    Follow Up Recommendations  Home health PT;Supervision/Assistance - 24 hour     Equipment Recommendations   (pt has equipment from back surgery)       Precautions / Restrictions Precautions Precautions: Anterior Hip Precaution Booklet Issued: Yes (comment) Restrictions Weight Bearing Restrictions: Yes LLE Weight Bearing: Weight bearing as tolerated    Mobility  Bed Mobility Overal bed mobility: Needs Assistance Bed Mobility: Supine to Sit     Supine to sit: HOB elevated;Min guard     General bed mobility comments: vc for scooting EoB  Transfers Overall transfer level: Needs assistance Equipment used: Rolling walker (2 wheeled) Transfers: Sit to/from Stand Sit to Stand: Min guard         General transfer comment: pt able to sit<>stand from bed with good safety  Ambulation/Gait Ambulation/Gait assistance: Min assist Ambulation Distance (Feet): 150 Feet Assistive device: Rolling walker (2 wheeled) Gait Pattern/deviations: Decreased step length - right;Decreased step length - left;Decreased stance time - left;Decreased weight shift to left;Antalgic;Trunk flexed;Step-through pattern Gait velocity: slowed Gait velocity interpretation: Below normal speed for age/gender General Gait Details: pt cadence is increasing and she is able to ambulate with step through  pattern, vc for upright posture and increased push through UE to decrease wightbearing as ambulation progresses and pain in hip increases.        Balance Overall balance assessment: Needs assistance Sitting-balance support: Feet unsupported;No upper extremity supported Sitting balance-Leahy Scale: Good Sitting balance - Comments: able to sit on 3 in 1 over toilet without feet on ground and no UE support with no LoB   Standing balance support: Single extremity supported;During functional activity Standing balance-Leahy Scale: Fair Standing balance comment: pt able to balance in standing with single UE to fix gown                             Cognition Arousal/Alertness: Awake/alert Behavior During Therapy: WFL for tasks assessed/performed Overall Cognitive Status: Within Functional Limits for tasks assessed                                        Exercises Total Joint Exercises Ankle Circles/Pumps: AROM;Both;20 reps;Seated Quad Sets: AROM;Both;10 reps;Seated Short Arc Quad: AROM;Left;10 reps;Supine Heel Slides: AROM;10 reps;Left;Supine Hip ABduction/ADduction: AROM;Left;10 reps;Supine Straight Leg Raises: AROM;AAROM;10 reps;Supine;Left        Pertinent Vitals/Pain Pain Assessment: 0-10 Pain Score: 2  Pain Location: L hip surgical site Pain Descriptors / Indicators: Constant;Aching;Sore  VSS    Home Living Family/patient expects to be discharged to:: Private residence Living Arrangements: Spouse/significant other Available Help at Discharge: Available 24 hours/day;Family Type of Home: House Home Access: Level entry   Home Layout: One level Home Equipment:  Grab bars - tub/shower;Walker - 2 wheels;Shower seat - built in (3 wheeled walker)      Prior Function Level of Independence: Independent      Comments: works, Tourist information centre manager, driver   PT Goals (current goals can now be found in the care plan section) Acute Rehab PT Goals Patient  Stated Goal: go home PT Goal Formulation: With patient Time For Goal Achievement: 04/07/17 Potential to Achieve Goals: Good    Frequency    7X/week      PT Plan         AM-PAC PT "6 Clicks" Daily Activity  Outcome Measure  Difficulty turning over in bed (including adjusting bedclothes, sheets and blankets)?: Total Difficulty moving from lying on back to sitting on the side of the bed? : Total Difficulty sitting down on and standing up from a chair with arms (e.g., wheelchair, bedside commode, etc,.)?: Total Help needed moving to and from a bed to chair (including a wheelchair)?: A Little Help needed walking in hospital room?: A Little Help needed climbing 3-5 steps with a railing? : A Lot 6 Click Score: 11    End of Session Equipment Utilized During Treatment: Gait belt Activity Tolerance: Patient tolerated treatment well Patient left: in chair;with call bell/phone within reach Nurse Communication: Mobility status;Patient requests pain meds;Weight bearing status PT Visit Diagnosis: Other abnormalities of gait and mobility (R26.89);Muscle weakness (generalized) (M62.81);Pain Pain - Right/Left: Left Pain - part of body: Hip     Time: 1610-9604 PT Time Calculation (min) (ACUTE ONLY): 28 min  Charges:  $Gait Training: 8-22 mins $Therapeutic Exercise: 8-22 mins                    G Codes:       Brandy Meyer B. Beverely Risen PT, DPT Acute Rehabilitation  [phone] Pager [phone]     Brandy Meyer 03/31/2017, 4:28 PM

## 2017-03-31 NOTE — Care Management CC44 (Deleted)
Condition Code 44 Documentation Completed  Patient Details  Name: Brandy Meyer MRN: [id number] Date of Birth: 01/07/1956   Condition Code 44 given:  Yes Patient signature on Condition Code 44 notice:  Yes Documentation of 2 MD's agreement:  Yes Code 44 added to claim:  Yes    Durenda Guthrie, RN 03/31/2017, 12:53 PM

## 2017-03-31 NOTE — Progress Notes (Signed)
Subjective: 1 Day Post-Op Procedure(s) (LRB): LEFT TOTAL HIP ARTHROPLASTY ANTERIOR APPROACH (Left) Patient reports pain as moderate.   Did not sleep well last night. Otherwise no complaints,  Objective: Vital signs in last 24 hours: Temp:  [97.5 F (36.4 C)-98.9 F (37.2 C)] 98.2 F (36.8 C) (05/02 0439) Pulse Rate:  [78-89] 83 (05/02 0439) Resp:  [12-25] 18 (05/02 0439) BP: (116-151)/(66-98) 116/68 (05/02 0439) SpO2:  [93 %-100 %] 97 % (05/02 0439) Weight:  [132 lb 4 oz (60 kg)] 132 lb 4 oz (60 kg) (05/01 1116)  Intake/Output from previous day: 05/01 0701 - 05/02 0700 In: 2810 [I.V.:2700; IV Piggyback:110] Out: 1425 [Urine:975; Blood:450] Intake/Output this shift: No intake/output data recorded.   Recent Labs  03/31/17 0531  HGB 9.6*    Recent Labs  03/31/17 0531  WBC 11.4*  RBC 3.19*  HCT 28.1*  PLT 291    Recent Labs  03/31/17 0531  NA 131*  K 4.0  CL 96*  CO2 26  BUN 7  CREATININE 0.66  GLUCOSE 140*  CALCIUM 8.5*   No results for input(s): LABPT, INR in the last 72 hours.  Left Lower Extremity: Sensation intact distally Dorsiflexion/Plantar flexion intact Incision: dressing C/D/I Compartment soft  Assessment/Plan: 1 Day Post-Op Procedure(s) (LRB): LEFT TOTAL HIP ARTHROPLASTY ANTERIOR APPROACH (Left) Up with therapy  Encourage incentive spirometry  ABLA secondary to surgery will monitor for symptoms of anemia   Cailah Reach 03/31/2017, 9:27 AM

## 2017-03-31 NOTE — Op Note (Signed)
Brandy Meyer, HUBNER                ACCOUNT NO.:  [id number]  MEDICAL RECORD NO.:  [id number]  LOCATION:                                FACILITY:  [facility]  PHYSICIAN:  Vanita Panda. Magnus Ivan, M.D.DATE OF BIRTH:  29-Apr-1956  DATE OF PROCEDURE:  03/30/2017 DATE OF DISCHARGE:                              OPERATIVE REPORT   PREOPERATIVE DIAGNOSIS:  Primary osteoarthritis and degenerative joint disease of the left hip.  POSTOPERATIVE DIAGNOSIS:  Primary osteoarthritis and degenerative joint disease of the left hip.  PROCEDURE:  Left total hip arthroplasty through direct anterior approach.  IMPLANTS:  DePuy Sector Gription acetabular component size 50 with a single screw, size 32 +0 neutral polyethylene liner, size 9 Corail femoral component with standard offset, size 32 +5 ceramic hip ball.  SURGEON:  Vanita Panda. Magnus Ivan, M.D.  ASSISTANT:  Richardean Canal, P.A.  ANESTHESIA:  General.  ANTIBIOTICS:  900 mg of IV clindamycin.  BLOOD LOSS:  450 mL.  COMPLICATIONS:  None.  INDICATIONS:  Ms. Sugrue is a 61 year old female with debilitating arthritis involving her left hip.  This has been well documented with x- rays showing complete loss of superior lateral joint space.  There are cystic and sclerotic changes and periarticular osteophytes.  Her pain is daily and it is 10/10.  It has detrimentally affected her activities of daily living, her quality of life, and her mobility.  She has tried and failed all forms of conservative treatment.  At this point, does wish to proceed with a total hip arthroplasty.  She understands the risk of acute blood loss anemia, nerve and vessel injury, fracture, infection, dislocation, DVT.  She understands our goals are to decrease pain, improve mobility, and overall improve quality of life.  PROCEDURE DESCRIPTION:  After informed consent was obtained, appropriate left hip was marked.  She was brought to the operating room where general anesthesia  was obtained while she was on her stretcher.  A Foley catheter was placed and then both feet had traction boots applied to them.  Next, she was placed supine on the Hana fracture table with the perineal post in place and both legs in inline skeletal traction devices, but no traction applied.  Her left operative hip was prepped and draped with ChloraPrep and sterile drapes.  Time-out was called and she was identified as correct patient, correct left hip.  We then made an incision just inferior and posterior to the anterior and superior iliac spine and carried this obliquely down the leg.  We dissected down the tensor fascia lata muscle and the tensor fascia was then divided longitudinally to proceed with a direct anterior approach to the hip. We identified and cauterized circumflex vessels and then identified the hip capsule.  I opened the hip capsule in an L-type format, finding a very large joint effusion.  We found significant periarticular osteophytes as well.  We then placed Cobra retractors on the medial and lateral femoral neck and made our femoral neck cut with an oscillating saw just proximal to the lesser trochanter and completed this with an osteotome.  We placed a corkscrew guide in the femoral head and removed the femoral head in its entirety  and found to be completely devoid of cartilage.  We then cleaned the acetabular remnants, acetabular labrum, and periarticular osteophytes.  We placed a bent Hohmann over the medial acetabular rim and then began reaming under direct visualization from a size 42 reamer up to a size 50 with all reamers under direct visualization and the last reamer under direct fluoroscopy, so we could obtain our depth of reaming, our inclination, and anteversion.  Once we were pleased with this, we placed a real DePuy Sector Gription acetabular component size 50 with a 36 +0 polyethylene liner.  We placed a single screw as well prior to the polyethylene  liner.  Attention was then turned to the femur.  With the leg externally rotated to 120 degrees, extended and adducted, we were able to place a bent Hohmann behind the greater trochanter and a Mueller retractor medially. Released the lateral joint capsule and used a box cutting osteotome in the inner femoral canal and a rongeur to lateralize.  We were able to then broach from a size 8 broach only going to size 9 because of her tight canal.  With the size 9, we trialed a standard offset femoral neck and a 32 +1 hip ball.  We rolled the leg back over and up with traction and rotation, reducing the pelvis, and we felt like we needed more offset and leg length.  We dislocated the hip and removed the trial components.  We were able to place the real Corail femoral component with standard offset size 9 and then a real 32 +5 hip ball.  We rolled leg back over and up with traction and internal rotation, reducing the pelvis, and we were pleased with leg length, offset, and range of motion.  We then irrigated the soft tissue with normal saline solution using pulsatile lavage.  We were able to close the joint capsule with interrupted #1 Ethibond suture followed by #1 Vicryl in the tensor fascia, 0 Vicryl in the deep tissue, 2-0 Vicryl in the subcutaneous tissue, one 4-0 Monocryl subcuticular stitch and Steri-Strips on the skin.  An Aquacel dressing was applied and she was taken off the Hana table, awakened, extubated, and taken to the recovery room in stable condition.  All final counts were correct.  There were no complications noted.  Of note, Richardean Canal, PA-C, assisted in entire case, his assistance was crucial for facilitating all aspects of this case.     Vanita Panda. Magnus Ivan, M.D.     CYB/MEDQ  D:  03/30/2017  T:  03/30/2017  Job:  [id number]

## 2017-03-31 NOTE — Care Management Obs Status (Deleted)
MEDICARE OBSERVATION STATUS NOTIFICATION   Patient Details  Name: AMERE IOTT MRN: [id number] Date of Birth: 1956-08-01   Medicare Observation Status Notification Given:  Yes    Durenda Guthrie, RN 03/31/2017, 12:53 PM

## 2017-03-31 NOTE — Evaluation (Signed)
Occupational Therapy Evaluation and Discharge Patient Details Name: Brandy Meyer MRN: [id number] DOB: [birth year] Today's Date: 03/31/2017    History of Present Illness Pt is a 61 yo female with c/o L hip pain secondary to end stage L hip OA., s/p L THA 03/31/17, PMH significant for HTN, OA, Osteopenia.    Clinical Impression   Pt reports she has required assist with LB dressing the past 3 weeks; otherwise she was independent with ADL. Currently pt supervision for ADL and functional mobility with the exception of min assist for LB ADL. All education completed with pt; she verbalized understanding. Pt planning to d/c home with 24/7 supervision from her husband initially. No further acute OT needs identified; signing off at this time. Please re-consult if needs change. Thank you for this referral.    Follow Up Recommendations  No OT follow up;Supervision - Intermittent    Equipment Recommendations  None recommended by OT    Recommendations for Other Services       Precautions / Restrictions Precautions Precautions: Anterior Hip Precaution Booklet Issued: Yes (comment) Restrictions Weight Bearing Restrictions: Yes LLE Weight Bearing: Weight bearing as tolerated      Mobility Bed Mobility Overal bed mobility: Needs Assistance Bed Mobility: Supine to Sit     Supine to sit: Min assist;HOB elevated     General bed mobility comments: Pt OOB in chair upon arrival  Transfers Overall transfer level: Needs assistance Equipment used: Rolling walker (2 wheeled) Transfers: Sit to/from Stand Sit to Stand: Supervision         General transfer comment: Supervision for safety. Good hand placement and technique    Balance Overall balance assessment: Needs assistance Sitting-balance support: Feet unsupported;No upper extremity supported Sitting balance-Leahy Scale: Good Sitting balance - Comments: able to sit on 3 in 1 over toilet without feet on ground and no UE support with no  LoB   Standing balance support: Bilateral upper extremity supported Standing balance-Leahy Scale: Fair Standing balance comment: pt able to balance in standing with single UE to wash hands                           ADL either performed or assessed with clinical judgement   ADL Overall ADL's : Needs assistance/impaired Eating/Feeding: Independent;Sitting   Grooming: Supervision/safety;Standing   Upper Body Bathing: Set up;Supervision/ safety;Sitting   Lower Body Bathing: Minimal assistance;Sit to/from stand   Upper Body Dressing : Set up;Supervision/safety;Sitting   Lower Body Dressing: Minimal assistance;Sit to/from stand Lower Body Dressing Details (indicate cue type and reason): Educated on compensatory strategies for LB ADL, recommended attempting LB ADL on her own every day before asking for help but husband to assist as needed. Toilet Transfer: Supervision/safety;Ambulation;BSC;RW   Toileting- Clothing Manipulation and Hygiene: Supervision/safety;Sit to/from stand   Tub/ Shower Transfer: Supervision/safety;Walk-in shower;Ambulation;Shower Dealer Details (indicate cue type and reason): Educated on shower transfer technique Functional mobility during ADLs: Supervision/safety;Rolling walker General ADL Comments: Educated pt on home safety with dog and maximizing independence upon return home. Recommeded frequent mobility throughout the day upon return home and ice for pain.     Vision         Perception     Praxis      Pertinent Vitals/Pain Pain Assessment: Faces Pain Score: 7  Faces Pain Scale: Hurts a little bit Pain Location: L hip Pain Descriptors / Indicators: Sore Pain Intervention(s): Monitored during session;Premedicated before session;Ice applied  Hand Dominance     Extremity/Trunk Assessment Upper Extremity Assessment Upper Extremity Assessment: Overall WFL for tasks assessed   Lower Extremity  Assessment Lower Extremity Assessment: Defer to PT evaluation LLE Deficits / Details: L hip strength and ROM limited by surgery, L knee and ankle grossly 4/5 and ROM WFL LLE: Unable to fully assess due to pain LLE Sensation:  (intact)   Cervical / Trunk Assessment Cervical / Trunk Assessment: Normal   Communication Communication Communication: No difficulties   Cognition Arousal/Alertness: Awake/alert Behavior During Therapy: WFL for tasks assessed/performed Overall Cognitive Status: Within Functional Limits for tasks assessed                                     General Comments       Exercises Exercises: Total Joint Total Joint Exercises Ankle Circles/Pumps: AROM;Both;20 reps;Seated Quad Sets: AROM;Both;10 reps;Seated   Shoulder Instructions      Home Living Family/patient expects to be discharged to:: Private residence Living Arrangements: Spouse/significant other Available Help at Discharge: Available 24 hours/day;Family Type of Home: House Home Access: Level entry     Home Layout: One level     Bathroom Shower/Tub: Producer, television/film/video: Standard Bathroom Accessibility: Yes How Accessible: Accessible via walker Home Equipment: Grab bars - tub/shower;Walker - 2 wheels;Bedside commode;Shower seat;Adaptive equipment (3 wheeled walker) Adaptive Equipment: Reacher        Prior Functioning/Environment Level of Independence: Independent        Comments: works, Tourist information centre manager, Hospital doctor. husband assisting with LB dressing for the past 3 weeks.        OT Problem List: Decreased range of motion;Impaired balance (sitting and/or standing);Pain      OT Treatment/Interventions:      OT Goals(Current goals can be found in the care plan section) Acute Rehab OT Goals Patient Stated Goal: go home OT Goal Formulation: All assessment and education complete, DC therapy  OT Frequency:     Barriers to D/C:            Co-evaluation               AM-PAC PT "6 Clicks" Daily Activity     Outcome Measure Help from another person eating meals?: None Help from another person taking care of personal grooming?: None Help from another person toileting, which includes using toliet, bedpan, or urinal?: A Little Help from another person bathing (including washing, rinsing, drying)?: A Little Help from another person to put on and taking off regular upper body clothing?: None Help from another person to put on and taking off regular lower body clothing?: A Little 6 Click Score: 21   End of Session Equipment Utilized During Treatment: Rolling walker  Activity Tolerance: Patient tolerated treatment well Patient left: in chair;with call bell/phone within reach  OT Visit Diagnosis: Other abnormalities of gait and mobility (R26.89)                Time: 5409-8119 OT Time Calculation (min): 18 min Charges:  OT General Charges $OT Visit: 1 Procedure OT Evaluation $OT Eval Moderate Complexity: 1 Procedure G-Codes:     Raciel Caffrey A. Brett Albino, M.S., OTR/L Pager: [phone]  Gaye Alken 03/31/2017, 11:45 AM

## 2017-04-01 MED ORDER — OXYCODONE-ACETAMINOPHEN 5-325 MG PO TABS: 1.0000 | ORAL_TABLET | ORAL | 0 refills | Status: DC | PRN

## 2017-04-01 MED ORDER — ASPIRIN 81 MG PO CHEW: 81.0000 mg | CHEWABLE_TABLET | Freq: Two times a day (BID) | ORAL | 0 refills | Status: DC

## 2017-04-01 MED ORDER — METHOCARBAMOL 500 MG PO TABS: 500.0000 mg | ORAL_TABLET | Freq: Four times a day (QID) | ORAL | 1 refills | Status: DC | PRN

## 2017-04-01 NOTE — Progress Notes (Signed)
Physical Therapy Treatment Patient Details Name: Brandy Meyer MRN: [id number] DOB: 1956-05-17 Today's Date: 04/01/2017    History of Present Illness Pt is a 61 yo female with c/o L hip pain secondary to end stage L hip OA., s/p L THA 03/31/17, PMH significant for HTN, OA, Osteopenia.     PT Comments    Pt is making good progress towards her goals. Pt is minA for bed mobility, supervision for transfers with RW and ambulation of 270 feet with RW. Pt requires skilled PT for continued gait training and to improve LE strength and ROM to safely navigate her home environment.    Follow Up Recommendations  Home health PT;Supervision/Assistance - 24 hour     Equipment Recommendations   (pt has equipment from back surgery)       Precautions / Restrictions Precautions Precautions: Anterior Hip Precaution Booklet Issued: Yes (comment) Restrictions Weight Bearing Restrictions: Yes LLE Weight Bearing: Weight bearing as tolerated    Mobility  Bed Mobility Overal bed mobility: Needs Assistance Bed Mobility: Supine to Sit       Sit to supine: Min assist   General bed mobility comments: pt need minA to manage LE back up into bed  Transfers Overall transfer level: Needs assistance Equipment used: Rolling walker (2 wheeled) Transfers: Sit to/from Stand Sit to Stand: Supervision         General transfer comment: pt stand to sit on EoB   Ambulation/Gait Ambulation/Gait assistance: Supervision Ambulation Distance (Feet): 270 Feet Assistive device: Rolling walker (2 wheeled) Gait Pattern/deviations: Antalgic;Trunk flexed;Step-through pattern Gait velocity: slowed Gait velocity interpretation: Below normal speed for age/gender General Gait Details: Pt with decreased ability for full heel strike without flexed forward posture, vc for pushing through her heel at heel strike           Balance Overall balance assessment: Needs assistance Sitting-balance support: Feet  unsupported;No upper extremity supported Sitting balance-Leahy Scale: Good Sitting balance - Comments: able to sit EoB and reach behind her outside BoS without LoB   Standing balance support: During functional activity;No upper extremity supported Standing balance-Leahy Scale: Good Standing balance comment: able to stand without UE support for gait belt donning                            Cognition Arousal/Alertness: Awake/alert Behavior During Therapy: WFL for tasks assessed/performed Overall Cognitive Status: Within Functional Limits for tasks assessed                                        Exercises Calf stretch with sheet 10x 10 sec hold         Pertinent Vitals/Pain Pain Assessment: 0-10 Pain Score: 2  Pain Location: L hip surgical site Pain Descriptors / Indicators: Constant;Aching;Burning Pain Intervention(s): Limited activity within patient's tolerance;Monitored during session  VSS           PT Goals (current goals can now be found in the care plan section) Acute Rehab PT Goals Patient Stated Goal: go home PT Goal Formulation: With patient Time For Goal Achievement: 04/07/17 Potential to Achieve Goals: Good Progress towards PT goals: Progressing toward goals    Frequency    7X/week      PT Plan Current plan remains appropriate       AM-PAC PT "6 Clicks" Daily Activity  Outcome Measure  Difficulty turning over  in bed (including adjusting bedclothes, sheets and blankets)?: Total Difficulty moving from lying on back to sitting on the side of the bed? : Total Difficulty sitting down on and standing up from a chair with arms (e.g., wheelchair, bedside commode, etc,.)?: Total Help needed moving to and from a bed to chair (including a wheelchair)?: A Little Help needed walking in hospital room?: A Little Help needed climbing 3-5 steps with a railing? : A Lot 6 Click Score: 11    End of Session Equipment Utilized During  Treatment: Gait belt Activity Tolerance: Patient tolerated treatment well Patient left: with call bell/phone within reach;in bed Nurse Communication: Mobility status;Patient requests pain meds PT Visit Diagnosis: Other abnormalities of gait and mobility (R26.89);Muscle weakness (generalized) (M62.81);Pain Pain - Right/Left: Left Pain - part of body: Hip     Time: [id number] PT Time Calculation (min) (ACUTE ONLY): 21 min  Charges:  $Gait Training: 8-22 mins $Therapeutic Exercise: 8-22 mins                    G Codes:       Brandy Bannister B. Beverely RisenVan Fleet PT, DPT Acute Rehabilitation  [phone] Pager [phone]     [name] 04/01/2017, 2:58 PM

## 2017-04-01 NOTE — Progress Notes (Signed)
Subjective: 2 Days Post-Op Procedure(s) (LRB): LEFT TOTAL HIP ARTHROPLASTY ANTERIOR APPROACH (Left) Patient reports pain as moderate.   Denies dizziness , SOB, Nausea or vomiting.  Objective: Vital signs in last 24 hours: Temp:  [98.2 F (36.8 C)-99.2 F (37.3 C)] 98.2 F (36.8 C) (05/03 0545) Pulse Rate:  [81-95] 90 (05/03 0912) Resp:  [18] 18 (05/03 0545) BP: (103-112)/(48-71) 108/50 (05/03 0912) SpO2:  [96 %-97 %] 96 % (05/03 0545)  Intake/Output from previous day: 05/02 0701 - 05/03 0700 In: 720 [P.O.:720] Out: 50 [Urine:50] Intake/Output this shift: Total I/O In: 360 [P.O.:360] Out: -    Recent Labs  03/31/17 0531  HGB 9.6*    Recent Labs  03/31/17 0531  WBC 11.4*  RBC 3.19*  HCT 28.1*  PLT 291    Recent Labs  03/31/17 0531  NA 131*  K 4.0  CL 96*  CO2 26  BUN 7  CREATININE 0.66  GLUCOSE 140*  CALCIUM 8.5*   No results for input(s): LABPT, INR in the last 72 hours.  Sensation intact distally Intact pulses distally Dorsiflexion/Plantar flexion intact Incision: dressing C/D/I Compartment soft  Assessment/Plan: 2 Days Post-Op Procedure(s) (LRB): LEFT TOTAL HIP ARTHROPLASTY ANTERIOR APPROACH (Left) Up with therapy  Discharge to home today after PT  Sobia Karger 04/01/2017, 9:19 AM

## 2017-04-01 NOTE — Discharge Instructions (Signed)

## 2017-04-01 NOTE — Discharge Summary (Signed)
Patient ID: Brandy Meyer MRN: [id number] DOB/AGE: [birth year] 61 y.o.  Admit date: 03/30/2017 Discharge date: 04/01/2017  Admission Diagnoses:  Principal Problem:   Unilateral primary osteoarthritis, left hip Active Problems:   Status post total replacement of left hip   Discharge Diagnoses:  Same  Past Medical History:  Diagnosis Date  . Anxiety   . Arthritis   . History of bronchitis    15 yrs ago  . Hyperlipidemia    takes Atorvastatin daily  . Hypertension    takes Metoprolol daily  . Joint pain   . Nocturia   . Osteopenia    by DEXA scan at [facility] on January 30, 2009  . Pneumonia    hx of-20 yrs ago    Surgeries: Procedure(s): LEFT TOTAL HIP ARTHROPLASTY ANTERIOR APPROACH on 03/30/2017   Consultants:   Discharged Condition: Improved  Hospital Course: Brandy Meyer is an 61 y.o. female who was admitted 03/30/2017 for operative treatment ofUnilateral primary osteoarthritis, left hip. Patient has severe unremitting pain that affects sleep, daily activities, and work/hobbies. After pre-op clearance the patient was taken to the operating room on 03/30/2017 and underwent  Procedure(s): LEFT TOTAL HIP ARTHROPLASTY ANTERIOR APPROACH.    Patient was given perioperative antibiotics: Anti-infectives    Start     Dose/Rate Route Frequency Ordered Stop   03/30/17 2000  clindamycin (CLEOCIN) IVPB 600 mg     600 mg 100 mL/hr over 30 Minutes Intravenous Every 6 hours 03/30/17 1701 03/31/17 0253   03/30/17 1137  clindamycin (CLEOCIN) IVPB 900 mg     900 mg 100 mL/hr over 30 Minutes Intravenous On call to O.R. 03/30/17 1137 03/30/17 1343       Patient was given sequential compression devices, early ambulation, and chemoprophylaxis to prevent DVT.  Patient benefited maximally from hospital stay and there were no complications.    Recent vital signs: Patient Vitals for the past 24 hrs:  BP Temp Temp src Pulse Resp SpO2  04/01/17 0545 112/70 98.2 F (36.8 C) Oral 95 18 96 %   03/31/17 2158 (!) 106/48 99.2 F (37.3 C) Oral 87 18 97 %  03/31/17 1300 103/71 98.4 F (36.9 C) Oral 81 18 97 %     Recent laboratory studies:  Recent Labs  03/31/17 0531  WBC 11.4*  HGB 9.6*  HCT 28.1*  PLT 291  NA 131*  K 4.0  CL 96*  CO2 26  BUN 7  CREATININE 0.66  GLUCOSE 140*  CALCIUM 8.5*     Discharge Medications:   Allergies as of 04/01/2017      Reactions   Shellfish Allergy Hives, Shortness Of Breath, Swelling   Crab meat and lobster.  Can tolerate small doses of shrimp.    Effexor [venlafaxine] Other (See Comments)   Sleepy/hyper   Penicillins Other (See Comments)   As a child. But recently tolerated Augmentin. Has patient had a PCN reaction causing immediate rash, facial/tongue/throat swelling, SOB or lightheadedness with hypotension: Unknown Has patient had a PCN reaction causing severe rash involving mucus membranes or skin necrosis: Unknown Has patient had a PCN reaction that required hospitalization: Yes Has patient had a PCN reaction occurring within the last 10 years: No If all of the above answers are "NO", then may proceed with Cephalosporin use.      Medication List    STOP taking these medications   amoxicillin-clavulanate 875-125 MG tablet Commonly known as:  AUGMENTIN   ciprofloxacin 500 MG tablet Commonly known as:  CIPRO  ibuprofen 200 MG tablet Commonly known as:  ADVIL,MOTRIN   traMADol 50 MG tablet Commonly known as:  ULTRAM     TAKE these medications   aspirin 81 MG chewable tablet Chew 1 tablet (81 mg total) by mouth 2 (two) times daily.   atorvastatin 40 MG tablet Commonly known as:  LIPITOR Take 60 mg by mouth daily at 6 PM.   celecoxib 200 MG capsule Commonly known as:  CELEBREX Take 1 capsule (200 mg total) by mouth daily. What changed:  when to take this   fluticasone 50 MCG/ACT nasal spray Commonly known as:  FLONASE Place 2 sprays into both nostrils every evening.   methocarbamol 500 MG tablet Commonly  known as:  ROBAXIN Take 1 tablet (500 mg total) by mouth every 6 (six) hours as needed for muscle spasms.   metoprolol succinate 25 MG 24 hr tablet Commonly known as:  TOPROL-XL TAKE 1 TABLET (25 MG TOTAL) BY MOUTH DAILY. What changed:  See the new instructions.   multivitamin with minerals Tabs tablet Take 1 tablet by mouth every evening.   oxyCODONE-acetaminophen 5-325 MG tablet Commonly known as:  ROXICET Take 1-2 tablets by mouth every 4 (four) hours as needed for severe pain.   SOOTHE XP OP Place 1 drop into both eyes every 8 (eight) hours as needed (dry eyes).            Durable Medical Equipment        Start     Ordered   03/30/17 1702  DME Walker rolling  Once    Question:  Patient needs a walker to treat with the following condition  Answer:  Status post total replacement of left hip   03/30/17 1701      Diagnostic Studies: Dg Pelvis Portable  Result Date: 03/30/2017 CLINICAL DATA:  Postop left hip arthroplasty. EXAM: PORTABLE PELVIS 1-2 VIEWS COMPARISON:  03/30/2017 FINDINGS: The hardware components of a left total hip arthroplasty device are identified. There is no periprosthetic fracture or subluxation identified. Unremarkable appearance of the right hip. IMPRESSION: 1. No complications status post left total hip arthroplasty. Electronically Signed   By: Signa Kell M.D.   On: 03/30/2017 17:02   Dg C-arm 1-60 Min  Result Date: 03/30/2017 CLINICAL DATA:  Intraoperative imaging for left hip replacement. EXAM: DG C-ARM 61-120 MIN; OPERATIVE LEFT HIP WITH PELVIS COMPARISON:  Plain films of the pelvis performed at [facility] 12/31/2016. FINDINGS: Fluoroscopic spot views of the low pelvis and left hip are provided. Left total hip arthroplasty is in place. No acute abnormality is identified. IMPRESSION: Left total hip replacement.  No acute finding. Electronically Signed   By: Drusilla Kanner M.D.   On: 03/30/2017 15:09   Dg Hip Operative Unilat W Or W/o  Pelvis Left  Result Date: 03/30/2017 CLINICAL DATA:  Intraoperative imaging for left hip replacement. EXAM: DG C-ARM 61-120 MIN; OPERATIVE LEFT HIP WITH PELVIS COMPARISON:  Plain films of the pelvis performed at [facility] 12/31/2016. FINDINGS: Fluoroscopic spot views of the low pelvis and left hip are provided. Left total hip arthroplasty is in place. No acute abnormality is identified. IMPRESSION: Left total hip replacement.  No acute finding. Electronically Signed   By: Drusilla Kanner M.D.   On: 03/30/2017 15:09    Disposition: 01-Home or Self Care    Follow-up Information    [facility] Follow up.   Specialty:  Home Health Services Why:  A representative from [facility] will contact you to  arrange start date and time for your therapy. Contact information: [address] [place] [address] [phone]        Kathryne Hitchhristopher Y Blackman, MD Follow up in 2 week(s).   Specialty:  Orthopedic Surgery Contact information: [address] [place] [address] [phone]            Signed: Richardean CanalGILBERT CLARK 04/01/2017, 8:14 AM

## 2017-04-01 NOTE — Progress Notes (Signed)
Physical Therapy Treatment Patient Details Name: Brandy Meyer MRN: [id number] DOB: 1956/06/24 Today's Date: 04/01/2017    History of Present Illness Pt is a 61 yo female with c/o L hip pain secondary to end stage L hip OA., s/p L THA 03/31/17, PMH significant for HTN, OA, Osteopenia.     PT Comments    Pt is making good progress towards her goals. Pt currently, minA for bed mobility, supervision for transfers with RW and minA for ambulation of 200 feet with RW and ascend/descend of 1 step with RW. Pt requires skilled PT to continue to progress gait training and to improve LE strength and ROM to be able to safely navigate in her home environment.     Follow Up Recommendations  Home health PT;Supervision/Assistance - 24 hour     Equipment Recommendations   (pt has equipment from back surgery)       Precautions / Restrictions Precautions Precautions: Anterior Hip Precaution Booklet Issued: Yes (comment) Restrictions Weight Bearing Restrictions: Yes LLE Weight Bearing: Weight bearing as tolerated    Mobility  Bed Mobility Overal bed mobility: Needs Assistance Bed Mobility: Sit to Supine       Sit to supine: Min assist   General bed mobility comments: min A for mangement of LE back into bed   Transfers Overall transfer level: Needs assistance Equipment used: Rolling walker (2 wheeled) Transfers: Sit to/from Stand Sit to Stand: Supervision         General transfer comment: pt able to sit<>stand from recliner with good safety  Ambulation/Gait Ambulation/Gait assistance: Min assist Ambulation Distance (Feet): 200 Feet Assistive device: Rolling walker (2 wheeled) Gait Pattern/deviations: Decreased weight shift to left;Antalgic;Trunk flexed;Step-through pattern Gait velocity: slowed Gait velocity interpretation: Below normal speed for age/gender General Gait Details: Pt with improving step length, still has difficulty shifting weight all the way over onto L LE, vc for  upright posture and anterior pelvic tilt     Stairs Stairs: Yes   Stair Management: No rails;Forwards;With walker Number of Stairs: 1 General stair comments: pt with safe steady ascent/descent of one stair with RW vc for sequencing.     Balance Overall balance assessment: Needs assistance Sitting-balance support: Feet unsupported;No upper extremity supported Sitting balance-Leahy Scale: Good Sitting balance - Comments: able to sit on 3 in 1 over toilet without feet on ground and no UE support with no LoB   Standing balance support: During functional activity;No upper extremity supported Standing balance-Leahy Scale: Good Standing balance comment: Pt able to adjust gown with no UE support                            Cognition Arousal/Alertness: Awake/alert Behavior During Therapy: WFL for tasks assessed/performed Overall Cognitive Status: Within Functional Limits for tasks assessed                                        Exercises Total Joint Exercises Hip ABduction/ADduction: AROM;Left;10 reps;Standing Straight Leg Raises: AROM;10 reps;Left;Standing Long Arc Quad: AROM;Left;10 reps;Seated Knee Flexion: AROM;Left;10 reps;Standing Marching in Standing: AROM;10 reps;Both;Standing Standing Hip Extension: AROM;Left;10 reps;Standing        Pertinent Vitals/Pain Pain Assessment: 0-10 Pain Score: 5  Pain Location: L hip surgical site Pain Descriptors / Indicators: Constant;Aching;Burning Pain Intervention(s): Limited activity within patient's tolerance;Monitored during session;Patient requesting pain meds-RN notified  VSS  PT Goals (current goals can now be found in the care plan section) Acute Rehab PT Goals Patient Stated Goal: go home PT Goal Formulation: With patient Time For Goal Achievement: 04/07/17 Potential to Achieve Goals: Good Progress towards PT goals: Not progressing toward goals - comment    Frequency     7X/week      PT Plan Current plan remains appropriate       AM-PAC PT "6 Clicks" Daily Activity  Outcome Measure  Difficulty turning over in bed (including adjusting bedclothes, sheets and blankets)?: Total Difficulty moving from lying on back to sitting on the side of the bed? : Total Difficulty sitting down on and standing up from a chair with arms (e.g., wheelchair, bedside commode, etc,.)?: Total Help needed moving to and from a bed to chair (including a wheelchair)?: A Little Help needed walking in hospital room?: A Little Help needed climbing 3-5 steps with a railing? : A Lot 6 Click Score: 11    End of Session Equipment Utilized During Treatment: Gait belt Activity Tolerance: Patient tolerated treatment well Patient left: with call bell/phone within reach;in bed Nurse Communication: Mobility status;Patient requests pain meds PT Visit Diagnosis: Other abnormalities of gait and mobility (R26.89);Muscle weakness (generalized) (M62.81);Pain Pain - Right/Left: Left Pain - part of body: Hip     Time: [id number] PT Time Calculation (min) (ACUTE ONLY): 29 min  Charges:  $Gait Training: 8-22 mins $Therapeutic Exercise: 8-22 mins                    G Codes:       Nikkita Adeyemi B. Beverely RisenVan Fleet PT, DPT Acute Rehabilitation  [phone] Pager [phone]     [name] 04/01/2017, 2:49 PM

## 2017-04-01 NOTE — Plan of Care (Signed)
Problem: Safety: Goal: Ability to remain free from injury will improve Outcome: Progressing Safety precautions and fall prevention maintained  Problem: Pain Managment: Goal: General experience of comfort will improve Outcome: Progressing Medicated once for pain  Problem: Physical Regulation: Goal: Will remain free from infection Outcome: Progressing No S/S of infection noted  Problem: Activity: Goal: Risk for activity intolerance will decrease Outcome: Progressing OOB to BSC multiple times, tolerates very well  Problem: Bowel/Gastric: Goal: Will not experience complications related to bowel motility Outcome: Progressing Denies bowel and gastric issues

## 2017-04-05 ENCOUNTER — Inpatient Hospital Stay (INDEPENDENT_AMBULATORY_CARE_PROVIDER_SITE_OTHER): Payer: BLUE CROSS/BLUE SHIELD | Admitting: Orthopaedic Surgery

## 2017-04-06 ENCOUNTER — Telehealth (INDEPENDENT_AMBULATORY_CARE_PROVIDER_SITE_OTHER): Payer: Self-pay | Admitting: Orthopaedic Surgery

## 2017-04-06 NOTE — Telephone Encounter (Signed)
Kendal HymenBonnie (nurse) with [facility] called advised received referral and will see patient today. The number to contact Kendal HymenBonnie is [phone]

## 2017-04-08 ENCOUNTER — Telehealth (INDEPENDENT_AMBULATORY_CARE_PROVIDER_SITE_OTHER): Payer: Self-pay | Admitting: Orthopaedic Surgery

## 2017-04-08 MED ORDER — OXYCODONE-ACETAMINOPHEN 5-325 MG PO TABS: 1.0000 | ORAL_TABLET | Freq: Three times a day (TID) | ORAL | 0 refills | Status: DC | PRN

## 2017-04-08 NOTE — Telephone Encounter (Signed)
Please advise 

## 2017-04-08 NOTE — Telephone Encounter (Signed)
Can come and pick up script 

## 2017-04-08 NOTE — Telephone Encounter (Signed)
Patient called needing Rx refilled (Oxycodone) The number to contact patient is [phone]    Daughter may pick up Rx    Brandy Meyer Patient also said something in her leg popped last night. Patient said it popped in her hip.

## 2017-04-08 NOTE — Telephone Encounter (Signed)
Patient aware Rx ready at front desk  

## 2017-04-12 ENCOUNTER — Telehealth (INDEPENDENT_AMBULATORY_CARE_PROVIDER_SITE_OTHER): Payer: Self-pay | Admitting: Orthopaedic Surgery

## 2017-04-12 ENCOUNTER — Ambulatory Visit (INDEPENDENT_AMBULATORY_CARE_PROVIDER_SITE_OTHER): Payer: BLUE CROSS/BLUE SHIELD | Admitting: Orthopaedic Surgery

## 2017-04-12 ENCOUNTER — Telehealth (INDEPENDENT_AMBULATORY_CARE_PROVIDER_SITE_OTHER): Payer: Self-pay

## 2017-04-12 DIAGNOSIS — Z96642 Presence of left artificial hip joint: Secondary | ICD-10-CM

## 2017-04-12 NOTE — Telephone Encounter (Signed)
Patient aware she can get incision wet

## 2017-04-12 NOTE — Progress Notes (Signed)
Brandy Meyer is following up 2 weeks status post a left total hip arthroplasty. She is ambulatory with a rolling walker did feel a pop in her hip last week and is able to walk on it  On examination her leg injury equal. She tolerates me putting her left hip the range of motion. His incisions healed nicely. There is a moderate seroma and I was able to drain 80 mL of fluid off of the hip area and this gave her immediate relief. I gave her reassurance that this can be normal postoperative. She's been on a baby aspirin twice a day and she'll stop this at this point.  She'll continue increase her activities. I gave her prescription for oxycodone. I'll see her back in 4 weeks to see how she is doing overall. She is going to try heating pad twice daily with cough over her left thigh.

## 2017-04-12 NOTE — Telephone Encounter (Signed)
Verbal given to [employer]

## 2017-04-12 NOTE — Telephone Encounter (Signed)
Delice Bisonara from [facility] was calling for verbal orders of 1 week 1, 3 week 2 and 2 week 2. CB # [phone]

## 2017-04-12 NOTE — Telephone Encounter (Signed)
Patient would like to know if she can take a shower/get the incision wet.  Please advise. Thank You.  CB# is [phone].

## 2017-04-19 ENCOUNTER — Other Ambulatory Visit (INDEPENDENT_AMBULATORY_CARE_PROVIDER_SITE_OTHER): Payer: Self-pay | Admitting: Orthopaedic Surgery

## 2017-04-22 ENCOUNTER — Encounter (INDEPENDENT_AMBULATORY_CARE_PROVIDER_SITE_OTHER): Payer: Self-pay | Admitting: Orthopaedic Surgery

## 2017-04-23 ENCOUNTER — Telehealth (INDEPENDENT_AMBULATORY_CARE_PROVIDER_SITE_OTHER): Payer: Self-pay

## 2017-04-23 ENCOUNTER — Other Ambulatory Visit (INDEPENDENT_AMBULATORY_CARE_PROVIDER_SITE_OTHER): Payer: Self-pay | Admitting: Family

## 2017-04-23 ENCOUNTER — Encounter (INDEPENDENT_AMBULATORY_CARE_PROVIDER_SITE_OTHER): Payer: Self-pay | Admitting: Orthopaedic Surgery

## 2017-04-23 ENCOUNTER — Other Ambulatory Visit (INDEPENDENT_AMBULATORY_CARE_PROVIDER_SITE_OTHER): Payer: Self-pay | Admitting: Orthopaedic Surgery

## 2017-04-23 MED ORDER — OXYCODONE-ACETAMINOPHEN 5-325 MG PO TABS: 1.0000 | ORAL_TABLET | Freq: Three times a day (TID) | ORAL | 0 refills | Status: DC | PRN

## 2017-04-23 NOTE — Telephone Encounter (Signed)
Brandy Meyer with [facility] called stating she was evaluating patient today and patient reports stepping up and hearing/feeling loud pop in her left hip. She is s/p THA. This is the second time in the past week that this has occurred. She is able to WTB/ambulate but unable to do SLR. I spoke with Dr Roda ShuttersXu and he recommended patient be seen with Dr Magnus IvanBlackman next week rather than waiting until her appt on June 8. Appt scheduled for patient for Tuesday morning.

## 2017-04-27 ENCOUNTER — Ambulatory Visit (INDEPENDENT_AMBULATORY_CARE_PROVIDER_SITE_OTHER): Payer: Self-pay

## 2017-04-27 ENCOUNTER — Ambulatory Visit (INDEPENDENT_AMBULATORY_CARE_PROVIDER_SITE_OTHER): Payer: BLUE CROSS/BLUE SHIELD | Admitting: Orthopaedic Surgery

## 2017-04-27 DIAGNOSIS — M25552 Pain in left hip: Secondary | ICD-10-CM | POA: Diagnosis not present

## 2017-04-27 DIAGNOSIS — Z96642 Presence of left artificial hip joint: Secondary | ICD-10-CM

## 2017-04-27 MED ORDER — OXYCODONE HCL 5 MG PO CAPS: 5.0000 mg | ORAL_CAPSULE | Freq: Three times a day (TID) | ORAL | 0 refills | Status: DC | PRN

## 2017-04-27 NOTE — Progress Notes (Signed)
The patient is now 28 days status post a left total hip replacement. She says she's had 2 episodes of the hip "popped" on her. She's work today early to make sure that hip is doing well. She is someone who had a significant way shortened before we're able to lengthen her and get her back out to normal leg length.  On examination she has guarding with her hip but I completed the range of motion without any evidence of instability at all. Her leg lengths are actually equal where she was short before.  An AP pelvis and lateral left hip show well-seated implant. I was able to compare this to previous films sure we lengthen her I see no evidence of fracture instability the implant at all.  I gave her reassurance I want her to stop all the exercises she is doing in just work on balance and coordination and gait training. I did refill her oxycodone but counseled her extensively about these medications in order try to limit her use of these is much as possible try anti-inflammatories. I do not need to see her back at that visit in 2 weeks though we should extended out for follow-up in 4 weeks but no x-rays are needed.

## 2017-04-29 ENCOUNTER — Other Ambulatory Visit (INDEPENDENT_AMBULATORY_CARE_PROVIDER_SITE_OTHER): Payer: Self-pay | Admitting: Physician Assistant

## 2017-04-29 NOTE — Telephone Encounter (Signed)
Please advise 

## 2017-05-06 ENCOUNTER — Ambulatory Visit (INDEPENDENT_AMBULATORY_CARE_PROVIDER_SITE_OTHER): Payer: BLUE CROSS/BLUE SHIELD | Admitting: Orthopaedic Surgery

## 2017-05-10 ENCOUNTER — Encounter (INDEPENDENT_AMBULATORY_CARE_PROVIDER_SITE_OTHER): Payer: Self-pay | Admitting: Orthopaedic Surgery

## 2017-05-14 ENCOUNTER — Other Ambulatory Visit (INDEPENDENT_AMBULATORY_CARE_PROVIDER_SITE_OTHER): Payer: Self-pay | Admitting: Orthopaedic Surgery

## 2017-05-14 MED ORDER — OXYCODONE-ACETAMINOPHEN 5-325 MG PO TABS: 1.0000 | ORAL_TABLET | Freq: Three times a day (TID) | ORAL | 0 refills | Status: DC | PRN

## 2017-05-17 ENCOUNTER — Other Ambulatory Visit: Payer: Self-pay | Admitting: Interventional Cardiology

## 2017-05-26 ENCOUNTER — Encounter (INDEPENDENT_AMBULATORY_CARE_PROVIDER_SITE_OTHER): Payer: Self-pay | Admitting: Orthopaedic Surgery

## 2017-05-26 ENCOUNTER — Other Ambulatory Visit (INDEPENDENT_AMBULATORY_CARE_PROVIDER_SITE_OTHER): Payer: Self-pay | Admitting: Orthopaedic Surgery

## 2017-05-26 ENCOUNTER — Other Ambulatory Visit (INDEPENDENT_AMBULATORY_CARE_PROVIDER_SITE_OTHER): Payer: Self-pay | Admitting: Physician Assistant

## 2017-05-26 MED ORDER — HYDROCODONE-ACETAMINOPHEN 5-325 MG PO TABS: 1.0000 | ORAL_TABLET | Freq: Two times a day (BID) | ORAL | 0 refills | Status: DC | PRN

## 2017-05-27 ENCOUNTER — Ambulatory Visit (INDEPENDENT_AMBULATORY_CARE_PROVIDER_SITE_OTHER): Payer: BLUE CROSS/BLUE SHIELD | Admitting: Orthopaedic Surgery

## 2017-06-07 ENCOUNTER — Ambulatory Visit (INDEPENDENT_AMBULATORY_CARE_PROVIDER_SITE_OTHER): Payer: BLUE CROSS/BLUE SHIELD | Admitting: Orthopaedic Surgery

## 2017-06-07 DIAGNOSIS — Z96642 Presence of left artificial hip joint: Secondary | ICD-10-CM

## 2017-06-07 NOTE — Progress Notes (Signed)
The patient is now about 9 weeks out from a left total hip arthroplasty. She is doing well and does feel that she can return to work by next week. She is walking without assistive device. She still having problems squatting and some pain when she first gets up but overall doing well.  She tolerates me putting her left hip the range of motion now much difficulty. She's got good strength in the left lower extremity but is not full just yet. She still has some stiffness. Her leg lengths are equal.  From my standpoint I do not need see her back for 6 months. At that visit I would like a low AP pelvis and lateral of her left hip. I do need to keep her out of work just this week with return to full work duty starting next week on 06/14/2017. The reason to keep her out of this week is just work on the final strengthening and stretching that she needs to return to full work.

## 2017-06-16 ENCOUNTER — Telehealth (INDEPENDENT_AMBULATORY_CARE_PROVIDER_SITE_OTHER): Payer: Self-pay | Admitting: Orthopaedic Surgery

## 2017-06-16 NOTE — Telephone Encounter (Signed)
Records 03-30-2017- present [facility] [phone]

## 2017-06-27 ENCOUNTER — Other Ambulatory Visit: Payer: Self-pay | Admitting: Interventional Cardiology

## 2017-06-29 ENCOUNTER — Other Ambulatory Visit: Payer: Self-pay | Admitting: Interventional Cardiology

## 2017-09-09 ENCOUNTER — Ambulatory Visit (INDEPENDENT_AMBULATORY_CARE_PROVIDER_SITE_OTHER): Payer: BLUE CROSS/BLUE SHIELD | Admitting: Interventional Cardiology

## 2017-09-09 ENCOUNTER — Encounter: Payer: Self-pay | Admitting: Interventional Cardiology

## 2017-09-09 VITALS — BP 130/70 | HR 94 | Ht 61.0 in | Wt 132.2 lb

## 2017-09-09 DIAGNOSIS — E785 Hyperlipidemia, unspecified: Secondary | ICD-10-CM

## 2017-09-09 DIAGNOSIS — R000 Tachycardia, unspecified: Secondary | ICD-10-CM

## 2017-09-09 MED ORDER — METOPROLOL SUCCINATE ER 25 MG PO TB24: 25.0000 mg | ORAL_TABLET | Freq: Every day | ORAL | 11 refills | Status: DC

## 2017-09-09 NOTE — Progress Notes (Signed)
Cardiology Office Note   Date:  09/09/2017   ID:  Brandy Meyer, DOB 1956/08/29, MRN [id number]  PCP:  Juluis Rainier, MD    No chief complaint on file.  Sinus tachycardia  Wt Readings from Last 3 Encounters:  09/09/17 132 lb 3.2 oz (60 kg)  03/30/17 132 lb 4 oz (60 kg)  03/22/17 132 lb 4 oz (60 kg)       History of Present Illness: Brandy Meyer is a 61 y.o. female    who ahd a negative cardiac w/u in 2011. SHe had a Holter monitor placed and her average HR was > 100. SHe was placed on a beta blocker several years ago. LV function was normal in the past.    She had hip surgery in 5/18 which was successful. She is walking more.  No cardiac issues at the time of surgery.   Denies : Chest pain. Dizziness. Leg edema. Nitroglycerin use. Orthopnea. Palpitations. Paroxysmal nocturnal dyspnea. Shortness of breath. Syncope.      Past Medical History:  Diagnosis Date  . Anxiety   . Arthritis   . History of bronchitis    15 yrs ago  . Hyperlipidemia    takes Atorvastatin daily  . Hypertension    takes Metoprolol daily  . Joint pain   . Nocturia   . Osteopenia    by DEXA scan at [facility] on January 30, 2009  . Pneumonia    hx of-20 yrs ago    Past Surgical History:  Procedure Laterality Date  . BACK SURGERY     x  2  . CESAREAN SECTION    . COLONOSCOPY    . LUNG SURGERY Left 2003   saw a spot on her lung , removed it, benign  . TOTAL HIP ARTHROPLASTY Left 03/30/2017   Procedure: LEFT TOTAL HIP ARTHROPLASTY ANTERIOR APPROACH;  Surgeon: Kathryne Hitch, MD;  Location: [facility] OR;  Service: Orthopedics;  Laterality: Left;     Current Outpatient Prescriptions  Medication Sig Dispense Refill  . atorvastatin (LIPITOR) 40 MG tablet Take 60 mg by mouth daily at 6 PM.     . fluticasone (FLONASE) 50 MCG/ACT nasal spray Place 2 sprays into both nostrils every evening.    . metoprolol succinate (TOPROL-XL) 25 MG 24 hr tablet TAKE ONE TABLET BY MOUTH DAILY 30 tablet 2  .  Multiple Vitamin (MULTIVITAMIN WITH MINERALS) TABS tablet Take 1 tablet by mouth every evening.     No current facility-administered medications for this visit.     Allergies:   Shellfish allergy; Effexor [venlafaxine]; and Penicillins    Social History:  The patient  reports that she has quit smoking. She has never used smokeless tobacco. She reports that she drinks alcohol. She reports that she does not use drugs.   Family History:  The patient's family history includes Alcoholism in her father; Heart attack in her mother; Lung cancer in her father.    ROS:  Please see the history of present illness.   Otherwise, review of systems are positive for improvement in hip pan post surgery.   All other systems are reviewed and negative.    PHYSICAL EXAM: VS:  BP 130/70   Pulse 94   Ht  (1.549 m)   Wt 132 lb 3.2 oz (60 kg)   SpO2 96%   BMI 24.98 kg/m  , BMI Body mass index is 24.98 kg/m. GEN: Well nourished, well developed, in no acute distress  HEENT: normal  Neck: no JVD, carotid bruits, or masses Cardiac: RRR; no murmurs, rubs, or gallops,no edema  Respiratory:  clear to auscultation bilaterally, normal work of breathing GI: soft, nontender, nondistended, + BS MS: no deformity or atrophy  Skin: warm and dry, no rash Neuro:  Strength and sensation are intact Psych: euthymic mood, full affect   EKG:   The ekg ordered in 4/18 demonstrates NSR, no ST changes   Recent Labs: 03/31/2017: BUN 7; Creatinine, Ser 0.66; Hemoglobin 9.6; Platelets 291; Potassium 4.0; Sodium 131   Lipid Panel    Component Value Date/Time   CHOL 202 (H) 04/15/2016 0841   TRIG 196 (H) 04/15/2016 0841   HDL 47 04/15/2016 0841   CHOLHDL 4.3 04/15/2016 0841   VLDL 39 (H) 04/15/2016 0841   LDLCALC 116 04/15/2016 0841   LDLDIRECT 127.0 09/05/2014 0851     Other studies Reviewed: Additional studies/ records that were reviewed today with results demonstrating: Normal EF in 2011 .   ASSESSMENT AND  PLAN:  1. Sinus tachycardia: Continue Toprol-XL.  HR well controlled.  2. Hyperlipidemia: Check fasting lipids.  COntinue atorvastatin 60 mg for now.  Adjust dose based on lipids. 3. BP well controlled.     Current medicines are reviewed at length with the patient today.  The patient concerns regarding her medicines were addressed.  The following changes have been made:  No change  Labs/ tests ordered today include:  No orders of the defined types were placed in this encounter.   Recommend 150 minutes/week of aerobic exercise Low fat, low carb, high fiber diet recommended  Disposition:   FU in 1 year   Signed, Lance Muss, MD  09/09/2017 3:50 PM    [facility] [address] [place], [place], [place]  [address] Phone: [phone]; Fax: [phone]

## 2017-09-09 NOTE — Patient Instructions (Signed)
Medication Instructions:  Your physician recommends that you continue on your current medications as directed. Please refer to the Current Medication list given to you today.   Labwork: Your physician recommends that you return for FASTING lab work: LIPIDS, LFTS   Testing/Procedures: None ordered  Follow-Up: Your physician wants you to follow-up in: 1 year with Dr. Varanasi. You will receive a reminder letter in the mail two months in advance. If you don't receive a letter, please call our office to schedule the follow-up appointment.   Any Other Special Instructions Will Be Listed Below (If Applicable).     If you need a refill on your cardiac medications before your next appointment, please call your pharmacy.   

## 2017-09-10 ENCOUNTER — Other Ambulatory Visit: Payer: BLUE CROSS/BLUE SHIELD

## 2017-09-15 ENCOUNTER — Other Ambulatory Visit: Payer: BLUE CROSS/BLUE SHIELD | Admitting: *Deleted

## 2017-09-15 DIAGNOSIS — E785 Hyperlipidemia, unspecified: Secondary | ICD-10-CM

## 2017-09-15 LAB — LIPID PANEL
CHOLESTEROL TOTAL: 187 mg/dL (ref 100–199)
Chol/HDL Ratio: 4 ratio (ref 0.0–4.4)
HDL: 47 mg/dL (ref >39)
LDL CALC: 96 mg/dL (ref 0–99)
Triglycerides: 219 mg/dL — ABNORMAL HIGH (ref 0–149)
VLDL Cholesterol Cal: 44 mg/dL — ABNORMAL HIGH (ref 5–40)

## 2017-09-15 LAB — HEPATIC FUNCTION PANEL
ALK PHOS: 87 IU/L (ref 39–117)
ALT: 22 IU/L (ref 0–32)
AST: 12 IU/L (ref 0–40)
Albumin: 4.6 g/dL (ref 3.6–4.8)
BILIRUBIN, DIRECT: 0.31 mg/dL (ref 0.00–0.40)
Bilirubin Total: 1.6 mg/dL — ABNORMAL HIGH (ref 0.0–1.2)
TOTAL PROTEIN: 6.8 g/dL (ref 6.0–8.5)

## 2017-12-08 ENCOUNTER — Ambulatory Visit (INDEPENDENT_AMBULATORY_CARE_PROVIDER_SITE_OTHER): Payer: BLUE CROSS/BLUE SHIELD | Admitting: Physician Assistant

## 2017-12-08 ENCOUNTER — Ambulatory Visit (INDEPENDENT_AMBULATORY_CARE_PROVIDER_SITE_OTHER): Payer: Self-pay

## 2017-12-08 ENCOUNTER — Encounter (INDEPENDENT_AMBULATORY_CARE_PROVIDER_SITE_OTHER): Payer: Self-pay | Admitting: Orthopaedic Surgery

## 2017-12-08 DIAGNOSIS — Z96642 Presence of left artificial hip joint: Secondary | ICD-10-CM

## 2017-12-08 NOTE — Progress Notes (Signed)
Office Visit Note   Patient: Brandy Meyer           Date of Birth: 1956/06/16           MRN: [id number] Visit Date: 12/08/2017              Requested by: Juluis RainierBarnes, Elizabeth, MD [address] [place], [place] [address] PCP: Juluis RainierBarnes, Elizabeth, MD   Assessment & Plan: Visit Diagnoses:  1. History of left hip replacement     Plan: She will continue to work on strengthening leg.  Scar tissue mobilization.  Follow-up with us on a as needed basis .  Questions were encouraged and answered at length.  Follow-Up Instructions: Return if symptoms worsen or fail to improve.   Orders:  Orders Placed This Encounter  Procedures  . XR HIP UNILAT W OR W/O PELVIS 1V LEFT   No orders of the defined types were placed in this encounter.     Procedures: No procedures performed   Clinical Data: No additional findings.   Subjective: No chief complaint on file.   HPI Brandy Meyer returns today now 8 months status post left total hip arthroplasty.  She states the hip overall is doing great.  She does have some numbness along the lateral aspect of the hip and also notes that there is some tightness of the skin not over the surgical incision with just lateral to it.  She otherwise is doing great and has no other complaints Review of Systems No fevers chills shortness of breath chest pain calf pain.  Objective: Vital Signs: There were no vitals taken for this visit.  Physical Exam  Constitutional: She is oriented to person, place, and time. She appears well-developed and well-nourished. No distress.  Pulmonary/Chest: Effort normal.  Neurological: She is alert and oriented to person, place, and time.  Skin: She is not diaphoretic.  Psychiatric: She has a normal mood and affect.    Ortho Exam Left hip full range of motion without pain.  Surgical incisions healed well no signs of infection.  Ambulates without any assistive device. Specialty Comments:  No specialty comments  available.  Imaging: Xr Hip Unilat W Or W/o Pelvis 1v Left  Result Date: 12/08/2017 AP pelvis lateral view left hip: No acute fractures.  Left hip is well located.  Status post left total hip arthroplasty with well-seated components.    PMFS History: Patient Active Problem List   Diagnosis Date Noted  . Unilateral primary osteoarthritis, left hip 03/30/2017  . Status post total replacement of left hip 03/30/2017  . S/P lumbar spinal fusion 10/11/2015  . Sinus tachycardia 09/04/2014  . Hyperlipidemia    Past Medical History:  Diagnosis Date  . Anxiety   . Arthritis   . History of bronchitis    15 yrs ago  . Hyperlipidemia    takes Atorvastatin daily  . Hypertension    takes Metoprolol daily  . Joint pain   . Nocturia   . Osteopenia    [facility]  . Pneumonia    hx of-20 yrs ago    Family History  Problem Relation Age of Onset  . Heart attack Mother   . Lung cancer Father   . Alcoholism Father     Past Surgical History:  Procedure Laterality Date  . BACK SURGERY     x  2  . CESAREAN SECTION    . COLONOSCOPY    . LUNG SURGERY Left 2003  saw a spot on her lung , removed it, benign  . TOTAL HIP ARTHROPLASTY Left 03/30/2017   Procedure: LEFT TOTAL HIP ARTHROPLASTY ANTERIOR APPROACH;  Surgeon: Kathryne Hitch, MD;  Location: [facility] OR;  Service: Orthopedics;  Laterality: Left;   Social History   Occupational History  . Not on file  Tobacco Use  . Smoking status: Former Games developer  . Smokeless tobacco: Never Used  . Tobacco comment: quit smoking 11 yrs ago  Substance and Sexual Activity  . Alcohol use: Yes    Comment: social  . Drug use: No  . Sexual activity: Not on file

## 2018-05-09 ENCOUNTER — Ambulatory Visit (INDEPENDENT_AMBULATORY_CARE_PROVIDER_SITE_OTHER): Payer: BLUE CROSS/BLUE SHIELD

## 2018-05-09 ENCOUNTER — Ambulatory Visit (INDEPENDENT_AMBULATORY_CARE_PROVIDER_SITE_OTHER): Payer: BLUE CROSS/BLUE SHIELD | Admitting: Physician Assistant

## 2018-05-09 ENCOUNTER — Encounter (INDEPENDENT_AMBULATORY_CARE_PROVIDER_SITE_OTHER): Payer: Self-pay | Admitting: Physician Assistant

## 2018-05-09 DIAGNOSIS — S92355A Nondisplaced fracture of fifth metatarsal bone, left foot, initial encounter for closed fracture: Secondary | ICD-10-CM

## 2018-05-09 DIAGNOSIS — M79672 Pain in left foot: Secondary | ICD-10-CM | POA: Diagnosis not present

## 2018-05-09 NOTE — Progress Notes (Signed)
Office Visit Note   Patient: Brandy Meyer           Date of Birth: [birth year]           MRN: [id number] Visit Date: 05/09/2018              Requested by: Juluis Rainier, MD [address] [place], [place] [address] PCP: Juluis Rainier, MD   Assessment & Plan: Visit Diagnoses:  1. Pain in left foot   2. Closed nondisplaced fracture of fifth metatarsal bone of left foot, initial encounter     Plan: We will place her in a short cam walker boot.  She will wear this whenever she is up ambulating.  We will see her back in 3 weeks to obtain AP lateral oblique views of the left foot.  Elevation icing encouraged.  Questions encouraged and answered.  Follow-Up Instructions: Return in about 3 weeks (around 05/30/2018) for Radiographs.   Orders:  Orders Placed This Encounter  Procedures  . XR Foot Complete Left   No orders of the defined types were placed in this encounter.     Procedures: No procedures performed   Clinical Data: No additional findings.   Subjective: Chief Complaint  Patient presents with  . Left Hip - Routine Post Op    HPI Brandy Meyer is well-known to Dr. Magnus Ivan service comes in today due to a left foot injury that occurred on Mother's Day so just over 3 weeks ago.  She states that she turned her foot going down some steps on her deck.  She had significant swelling and pain.  She is been elevating icing.  She continues to have pain in the foot though.  She has started wearing a regular shoe within the last week. Review of Systems Denies chest pain, shortness breath, loss of consciousness, dizziness.  Objective: Vital Signs: There were no vitals taken for this visit.  Physical Exam General: Well-developed well-nourished female no acute distress mood and affect appropriate Psych alert and oriented x3 Vascular: Left calf supple.  Dorsal pedal pulse intact left foot. Skin no rashes skin lesions ulcerations erythema ecchymosis about the left  foot. Ortho Exam Left foot she has tenderness over the base of the fifth metatarsal.  Remainder the foot is nontender.  She is nontender over the peroneal tendons posterior to tibial tendon.  Good range of motion of the left ankle without significant pain.  Nontender over the medial and lateral malleoli. Specialty Comments:  No specialty comments available.  Imaging: Xr Foot Complete Left  Result Date: 05/09/2018 3 views left foot: Fifth metatarsal fracture nondisplaced involving proximal portions zone 1.  No other fractures seen.    PMFS History: Patient Active Problem List   Diagnosis Date Noted  . Unilateral primary osteoarthritis, left hip 03/30/2017  . Status post total replacement of left hip 03/30/2017  . S/P lumbar spinal fusion 10/11/2015  . Sinus tachycardia 09/04/2014  . Hyperlipidemia    Past Medical History:  Diagnosis Date  . Anxiety   . Arthritis   . History of bronchitis    15 yrs ago  . Hyperlipidemia    takes Atorvastatin daily  . Hypertension    takes Metoprolol daily  . Joint pain   . Nocturia   . Osteopenia    by [facility] at [facility] on January 30, 2009  . Pneumonia    hx of-20 yrs ago    Family History  Problem Relation Age of Onset  . Heart attack  Mother   . Lung cancer Father   . Alcoholism Father     Past Surgical History:  Procedure Laterality Date  . BACK SURGERY     x  2  . CESAREAN SECTION    . COLONOSCOPY    . LUNG SURGERY Left 2003   saw a spot on her lung , removed it, benign  . TOTAL HIP ARTHROPLASTY Left 03/30/2017   Procedure: LEFT TOTAL HIP ARTHROPLASTY ANTERIOR APPROACH;  Surgeon: Kathryne Hitchhristopher Y Blackman, MD;  Location: [facility] OR;  Service: Orthopedics;  Laterality: Left;   Social History   Occupational History  . Not on file  Tobacco Use  . Smoking status: Former Games developermoker  . Smokeless tobacco: Never Used  . Tobacco comment: quit smoking 11 yrs ago  Substance and Sexual Activity  . Alcohol use: Yes    Comment: social  . Drug  use: No  . Sexual activity: Not on file

## 2018-05-30 ENCOUNTER — Ambulatory Visit (INDEPENDENT_AMBULATORY_CARE_PROVIDER_SITE_OTHER): Payer: BLUE CROSS/BLUE SHIELD | Admitting: Physician Assistant

## 2018-05-30 ENCOUNTER — Ambulatory Visit (INDEPENDENT_AMBULATORY_CARE_PROVIDER_SITE_OTHER): Payer: Self-pay

## 2018-05-30 ENCOUNTER — Encounter (INDEPENDENT_AMBULATORY_CARE_PROVIDER_SITE_OTHER): Payer: Self-pay | Admitting: Physician Assistant

## 2018-05-30 DIAGNOSIS — S92355A Nondisplaced fracture of fifth metatarsal bone, left foot, initial encounter for closed fracture: Secondary | ICD-10-CM

## 2018-05-30 NOTE — Progress Notes (Signed)
Office Visit Note   Patient: Brandy HanksLisa A Meyer           Date of Birth: 1956/01/10           MRN: [id number] Visit Date: 05/30/2018              Requested by: Juluis RainierBarnes, Elizabeth, MD [address] [place], [place] [address] PCP: Juluis RainierBarnes, Elizabeth, MD   Assessment & Plan: Visit Diagnoses:  1. Closed nondisplaced fracture of fifth metatarsal bone of left foot, initial encounter     Plan: She can go into a regular shoe as tolerated.  No high impact activities.  See her back in 1 month obtain 3 views of her left foot at that time.  Follow-Up Instructions: Return in about 1 month (around 06/27/2018) for Radiographs.   Orders:  Orders Placed This Encounter  Procedures  . XR Foot Complete Left   No orders of the defined types were placed in this encounter.     Procedures: No procedures performed   Clinical Data: No additional findings.   Subjective: Chief Complaint  Patient presents with  . Left Foot - Fracture, Follow-up    HPI Brandy Meyer returns today 6 weeks status post left fifth metatarsal fracture.  She has been in a cam walker boot.  She states the foot overall feels much improved.  She is having weakness of the ankle though.  She had no new injuries otherwise. Review of Systems   Objective: Vital Signs: There were no vitals taken for this visit.  Physical Exam  Ortho Exam Left foot she has minimal tenderness over the base the fifth metatarsal.  She is able to evert the foot.  5 out of 5 strength with eversion against resistance.  No rashes skin lesions ulcerations or impending ulcers.  Left foot 3 views: Specialty Comments:  No specialty comments available.  Imaging: Xr Foot Complete Left  Result Date: 05/30/2018 Fifth metatarsal fracture shows good consolidation.  No change in overall position alignment.  No other fracture seen throughout the foot.    PMFS History: Patient Active Problem List   Diagnosis Date Noted  . Unilateral primary  osteoarthritis, left hip 03/30/2017  . Status post total replacement of left hip 03/30/2017  . S/P lumbar spinal fusion 10/11/2015  . Sinus tachycardia 09/04/2014  . Hyperlipidemia    Past Medical History:  Diagnosis Date  . Anxiety   . Arthritis   . History of bronchitis    15 yrs ago  . Hyperlipidemia    takes Atorvastatin daily  . Hypertension    takes Metoprolol daily  . Joint pain   . Nocturia   . Osteopenia    [facility]  . Pneumonia    hx of-20 yrs ago    Family History  Problem Relation Age of Onset  . Heart attack Mother   . Lung cancer Father   . Alcoholism Father     Past Surgical History:  Procedure Laterality Date  . BACK SURGERY     x  2  . CESAREAN SECTION    . COLONOSCOPY    . LUNG SURGERY Left 2003   saw a spot on her lung , removed it, benign  . TOTAL HIP ARTHROPLASTY Left 03/30/2017   Procedure: LEFT TOTAL HIP ARTHROPLASTY ANTERIOR APPROACH;  Surgeon: Kathryne Hitchhristopher Y Blackman, MD;  Location: [facility] OR;  Service: Orthopedics;  Laterality: Left;   Social History   Occupational History  . Not  on file  Tobacco Use  . Smoking status: Former Games developer  . Smokeless tobacco: Never Used  . Tobacco comment: quit smoking 11 yrs ago  Substance and Sexual Activity  . Alcohol use: Yes    Comment: social  . Drug use: No  . Sexual activity: Not on file

## 2018-06-29 ENCOUNTER — Ambulatory Visit (INDEPENDENT_AMBULATORY_CARE_PROVIDER_SITE_OTHER): Payer: Self-pay

## 2018-06-29 ENCOUNTER — Encounter (INDEPENDENT_AMBULATORY_CARE_PROVIDER_SITE_OTHER): Payer: Self-pay | Admitting: Physician Assistant

## 2018-06-29 ENCOUNTER — Ambulatory Visit (INDEPENDENT_AMBULATORY_CARE_PROVIDER_SITE_OTHER): Payer: BLUE CROSS/BLUE SHIELD | Admitting: Physician Assistant

## 2018-06-29 DIAGNOSIS — S92355A Nondisplaced fracture of fifth metatarsal bone, left foot, initial encounter for closed fracture: Secondary | ICD-10-CM

## 2018-06-29 NOTE — Progress Notes (Signed)
Office Visit Note   Patient: Brandy Meyer           Date of Birth: 15-Jan-1956           MRN: [id number] Visit Date: 06/29/2018              Requested by: Juluis Rainier, MD [address] [place], [place] [address] PCP: Juluis Rainier, MD   Assessment & Plan: Visit Diagnoses:  1. Closed nondisplaced fracture of fifth metatarsal bone of left foot, initial encounter     Plan: She is activities as tolerated.  She will follow-up with [place] on an as-needed basis.  I did tell her she can have some achiness for the next few months in regards to the lateral aspect of the foot.  She is to be good about her shoe wear wear good supportive shoe.  Follow-Up Instructions: Return if symptoms worsen or fail to improve.   Orders:  Orders Placed This Encounter  Procedures  . XR Foot Complete Left   No orders of the defined types were placed in this encounter.     Procedures: No notes on file   Clinical Data: No additional findings.   Subjective: Chief Complaint  Patient presents with  . Left Foot - Follow-up    HPI [name] returns now 10 weeks status post left fifth metatarsal base fracture.  Overall doing well.  She is back in a normal shoe.  She states that she does have some achy pain lateral aspect of the ankle and foot.  She has had no new injuries. Review of Systems   Objective: Vital Signs: There were no vitals taken for this visit.  Physical Exam  Ortho Exam Left foot no rashes skin lesions ulcerations erythema ecchymosis or edema.  She has slight tenderness over the base the fifth metatarsal.  She has 5 out of 5 strength eversion of the left foot against resistance but does cause some discomfort. Specialty Comments:  No specialty comments available.  Imaging: Xr Foot Complete Left  Result Date: 06/29/2018 Left foot 3 views: Left fifth metatarsal base fracture is healed well.  Fracture remains unchanged.  No other fractures or bony abnormalities  noted.    PMFS History:  Patient Active Problem List   Diagnosis Date Noted  . Unilateral primary osteoarthritis, left hip 03/30/2017  . Status post total replacement of left hip 03/30/2017  . S/P lumbar spinal fusion 10/11/2015  . Sinus tachycardia 09/04/2014  . Hyperlipidemia    Past Medical History:  Diagnosis Date  . Anxiety   . Arthritis   . History of bronchitis    15 yrs ago  . Hyperlipidemia    takes Atorvastatin daily  . Hypertension    takes Metoprolol daily  . Joint pain   . Nocturia   . Osteopenia    by D[facility]A scan at [facility] on January 30, 2009  . Pneumonia    hx of-20 yrs ago    Family History  Problem Relation Age of Onset  . Heart attack Mother   . Lung cancer Father   . Alcoholism Father     Past Surgical History:  Procedure Laterality Date  . BACK SURGERY     x  2  . CESAREAN SECTION    . COLONOSCOPY    . LUNG SURGERY Left 2003   saw a spot on her lung , removed it, benign  . TOTAL HIP ARTHROPLASTY Left 03/30/2017   Procedure: LEFT TOTAL HIP ARTHROPLASTY ANTERIOR APPROACH;  Surgeon: Kathryne Hitchhristopher Y Blackman, MD;  [facility];  Service: Orthopedics;  Laterality: Left;   Social History   Occupational History  . Not on file  Tobacco Use  . Smoking status: Former Games developermoker  . Smokeless tobacco: Never Used  . Tobacco comment: quit smoking 11 yrs ago  Substance and Sexual Activity  . Alcohol use: Yes    Comment: social  . Drug use: No  . Sexual activity: Not on file

## 2018-07-07 ENCOUNTER — Other Ambulatory Visit: Payer: Self-pay | Admitting: Interventional Cardiology

## 2018-09-28 ENCOUNTER — Encounter: Payer: Self-pay | Admitting: Cardiology

## 2018-10-07 ENCOUNTER — Other Ambulatory Visit: Payer: Self-pay | Admitting: Interventional Cardiology

## 2018-10-21 ENCOUNTER — Ambulatory Visit (INDEPENDENT_AMBULATORY_CARE_PROVIDER_SITE_OTHER): Payer: BLUE CROSS/BLUE SHIELD | Admitting: Cardiology

## 2018-10-21 ENCOUNTER — Encounter: Payer: Self-pay | Admitting: Cardiology

## 2018-10-21 VITALS — BP 128/78 | HR 88 | Ht 61.0 in | Wt 135.0 lb

## 2018-10-21 DIAGNOSIS — E785 Hyperlipidemia, unspecified: Secondary | ICD-10-CM

## 2018-10-21 DIAGNOSIS — R000 Tachycardia, unspecified: Secondary | ICD-10-CM

## 2018-10-21 DIAGNOSIS — R079 Chest pain, unspecified: Secondary | ICD-10-CM | POA: Diagnosis not present

## 2018-10-21 MED ORDER — ATORVASTATIN CALCIUM 40 MG PO TABS: 60.0000 mg | ORAL_TABLET | Freq: Every day | ORAL | 3 refills | Status: DC

## 2018-10-21 MED ORDER — METOPROLOL SUCCINATE ER 25 MG PO TB24: 25.0000 mg | ORAL_TABLET | Freq: Every day | ORAL | 3 refills | Status: DC

## 2018-10-21 NOTE — Patient Instructions (Addendum)
Your physician recommends that you continue on your current medications as directed. Please refer to the Current Medication list given to you today.   Your physician recommends that you return for lab work in:   NEXT WEEK BMET LIPID AND LIVER   Your physician wants you to follow-up in: YEAR WITH DR Lyn HollingsheadVARANASI  You will receive a reminder letter in the mail two months in advance. If you don't receive a letter, please call our office to schedule the follow-up appointment.   Please arrive at the [facility] main entrance of [facility] at xx:xx AM (30-45 minutes prior to test start time)  [facility] [address] [place], [place] [address] [phone]  Proceed to the [facility] Radiology Department (First Floor).  Please follow these instructions carefully (unless otherwise directed):  Marland Kitchen.  On the Night Before the Test: . Be sure to Drink plenty of water. . Do not consume any caffeinated/decaffeinated beverages or chocolate 12 hours prior to your test. . Do not take any antihistamines 12 hours prior to your test. .  IOn the Day of the Test: . Drink plenty of water. Do not drink any water within one hour of the test. . Do not eat any food 4 hours prior to the test. . You may take your regular medications prior to the test.  . Take metoprolol (Lopressor) two hours prior to test. . HOLD Furosemide/Hydrochlorothiazide morning of the test.   *For Clinical Staff only. Please instruct patient the following:*        -Drink plenty of water       -Hold Furosemide/hydrochlorothiazide morning of the test       -Take metoprolol (Lopressor) 2 hours prior to test (if applicable).                  -               -IF HR is greater than 55 BPM and patient is less than or equal to 62 yrs old Lopressor 100mg  x1.                -       After the Test: . Drink plenty of water. . After receiving IV contrast, you may experience a mild flushed feeling. This is normal. . On  occasion, you may experience a mild rash up to 24 hours after the test. This is not dangerous. If this occurs, you can take Benadryl 25 mg and increase your fluid intake. . If you experience trouble breathing, this can be serious. If it is severe call 911 IMMEDIATELY. If it is mild, please call our office. . If you take any of these medications: Glipizide/Metformin, Avandament, Glucavance, please do not take 48 hours after completing test.

## 2018-10-21 NOTE — Progress Notes (Signed)
10/21/2018 Brandy Meyer   December 22, 1955  [id number]  Primary Physician Juluis Rainier, MD Primary Cardiologist: Dr. Eldridge Dace   Reason for Visit/CC: Yearly F/u for h/o sinus tachycardia; New complaint of CP  HPI:  Brandy Meyer is a 62 y.o. female who is being seen today for her yearly f/u. She has a h/o sinus tachycardia, HLD, prediabetes and family history of CAD/sudden cardiac death (mother had a massive MI in her early 79s and died suddenly).  She is a former smoker but quit 13 years ago.  She is followed by Dr. Eldridge Dace. Per records, she had negative cardiac w/u in 2011. She had a Holter monitor placed and her average HR was >100. She was placed on a beta blocker several years ago. LV function was normal in the past. Last seen by Dr. Eldridge Dace in 08/2017 and doing well. No cardiac symptoms. Her HR and BP were well controlled. Toprolol XL was continued.   She is here in clinic today for follow-up.  She denies any palpitations.  EKG shows normal sinus rhythm.  Heart rates well controlled.  Blood pressure is also well controlled she is due for repeat lipid panel.  Last LDL in April was 114 mg/dL.  Unfortunately she is not fasting today and will return next week for laboratory work.  She does complain of new substernal chest discomfort with radiation to the anterior neck.  She says it feels like indigestion.  It can occur with meals but also can occur in the absence of eating.  Lasts for several minutes and resolved spontaneously.  Occurs at rest.  She has not noticed any significant symptoms with physical activity.  No dyspnea.  Current Meds  Medication Sig  . atorvastatin (LIPITOR) 40 MG tablet Take 60 mg by mouth daily at 6 PM.   . fluticasone (FLONASE) 50 MCG/ACT nasal spray Place 2 sprays into both nostrils every evening.  . metoprolol succinate (TOPROL-XL) 25 MG 24 hr tablet Take 1 tablet (25 mg total) by mouth daily. Please keep upcoming appt for future refills. Thank you.    Allergies  Allergen Reactions  . Shellfish Allergy Hives, Shortness Of Breath and Swelling    Crab meat and lobster.  Can tolerate small doses of shrimp.   . Effexor [Venlafaxine] Other (See Comments)    Sleepy/hyper  . Penicillins Other (See Comments)    As a child. But recently tolerated Augmentin. Has patient had a PCN reaction causing immediate rash, facial/tongue/throat swelling, SOB or lightheadedness with hypotension: Unknown Has patient had a PCN reaction causing severe rash involving mucus membranes or skin necrosis: Unknown Has patient had a PCN reaction that required hospitalization: Yes Has patient had a PCN reaction occurring within the last 10 years: No If all of the above answers are "NO", then may proceed with Cephalosporin use.    Past Medical History:  Diagnosis Date  . Anxiety   . Arthritis   . History of bronchitis    15 yrs ago  . Hyperlipidemia    takes Atorvastatin daily  . Hypertension    takes Metoprolol daily  . Joint pain   . Nocturia   . Osteopenia    by DEXA scan at [facility] on January 30, 2009  . Pneumonia    hx of-20 yrs ago   Family History  Problem Relation Age of Onset  . Heart attack Mother   . Lung cancer Father   . Alcoholism Father    Past Surgical History:  Procedure Laterality Date  .  BACK SURGERY     x  2  . CESAREAN SECTION    . COLONOSCOPY    . LUNG SURGERY Left 2003   saw a spot on her lung , removed it, benign  . TOTAL HIP ARTHROPLASTY Left 03/30/2017   Procedure: LEFT TOTAL HIP ARTHROPLASTY ANTERIOR APPROACH;  Surgeon: Kathryne Hitchhristopher Y Blackman, MD;  Location: [facility] OR;  Service: Orthopedics;  Laterality: Left;   Social History   Socioeconomic History  . Marital status: Married    Spouse name: Not on file  . Number of children: Not on file  . Years of education: Not on file  . Highest education level: Not on file  Occupational History  . Not on file  Social Needs  . Financial resource strain: Not on file  . Food  insecurity:    Worry: Not on file    Inability: Not on file  . Transportation needs:    Medical: Not on file    Non-medical: Not on file  Tobacco Use  . Smoking status: Former Games developermoker  . Smokeless tobacco: Never Used  . Tobacco comment: quit smoking 11 yrs ago  Substance and Sexual Activity  . Alcohol use: Yes    Comment: social  . Drug use: No  . Sexual activity: Not on file  Lifestyle  . Physical activity:    Days per week: Not on file    Minutes per session: Not on file  . Stress: Not on file  Relationships  . Social connections:    Talks on phone: Not on file    Gets together: Not on file    Attends religious service: Not on file    Active member of club or organization: Not on file    Attends meetings of clubs or organizations: Not on file    Relationship status: Not on file  . Intimate partner violence:    Fear of current or ex partner: Not on file    Emotionally abused: Not on file    Physically abused: Not on file    Forced sexual activity: Not on file  Other Topics Concern  . Not on file  Social History Narrative  . Not on file     Review of Systems: General: negative for chills, fever, night sweats or weight changes.  Cardiovascular: negative for chest pain, dyspnea on exertion, edema, orthopnea, palpitations, paroxysmal nocturnal dyspnea or shortness of breath Dermatological: negative for rash Respiratory: negative for cough or wheezing Urologic: negative for hematuria Abdominal: negative for nausea, vomiting, diarrhea, bright red blood per rectum, melena, or hematemesis Neurologic: negative for visual changes, syncope, or dizziness All other systems reviewed and are otherwise negative except as noted above.   Physical Exam:  Blood pressure 128/78, pulse 88, height 5\' 1"  (1.549 m), weight 135 lb (61.2 kg), SpO2 95 %.  General appearance: alert, cooperative and no distress Neck: no carotid bruit and no JVD Lungs: clear to auscultation bilaterally Heart:  regular rate and rhythm, S1, S2 normal, no murmur, click, rub or gallop Pulses: 2+ and symmetric Skin: Skin color, texture, turgor normal. No rashes or lesions Neurologic: Grossly normal  EKG NSR 88 bpm -- personally reviewed   ASSESSMENT AND PLAN:   1. Chest Pain: Question cardiac versus GI.  EKG shows normal sinus rhythm with no ischemic abnormalities.  She does have multiple cardiac risk factors including family history of CAD and sudden cardiac death (mother early 7070s), prior history of tobacco use, hyperlipidemia and prediabetes.  I recommend a coronary  CTA with morphology and calcium score for risk stratification.  2.  History of sinus tachycardia: Controlled with beta-blocker.  Normal sinus rhythm on EKG.  Heart rate 88 bpm.  Well-controlled.  Continue current therapy.  3.  Hyperlipidemia: Last lipid panel in April showed elevated LDL at 114 mg/dL.  We will repeat fasting lipid panel and hepatic function test.  Continue statin.  If coronary CTA shows evidence of underlying coronary artery disease and her LDL goal will be less than 70.  4.  Prediabetes: Hemoglobin A1c in April was 5.7.  Patient was made aware of the diagnosis and advised that this will need to be followed year.  Follow-Up: In 1 year with Dr. Newman Nickels Delmer Islam, [facility] 10/21/2018 3:38 PM

## 2018-10-25 ENCOUNTER — Other Ambulatory Visit: Payer: BLUE CROSS/BLUE SHIELD

## 2018-10-26 ENCOUNTER — Other Ambulatory Visit: Payer: BLUE CROSS/BLUE SHIELD | Admitting: *Deleted

## 2018-10-26 DIAGNOSIS — R000 Tachycardia, unspecified: Secondary | ICD-10-CM

## 2018-10-26 DIAGNOSIS — E785 Hyperlipidemia, unspecified: Secondary | ICD-10-CM

## 2018-10-26 LAB — BASIC METABOLIC PANEL
BUN/Creatinine Ratio: 12 (ref 12–28)
BUN: 8 mg/dL (ref 8–27)
CALCIUM: 9.4 mg/dL (ref 8.7–10.3)
CHLORIDE: 98 mmol/L (ref 96–106)
CO2: 21 mmol/L (ref 20–29)
Creatinine, Ser: 0.67 mg/dL (ref 0.57–1.00)
GFR calc Af Amer: 109 mL/min/{1.73_m2} (ref >59)
GFR calc non Af Amer: 95 mL/min/{1.73_m2} (ref >59)
GLUCOSE: 101 mg/dL — AB (ref 65–99)
POTASSIUM: 4.4 mmol/L (ref 3.5–5.2)
Sodium: 138 mmol/L (ref 134–144)

## 2018-10-26 LAB — LIPID PANEL
CHOL/HDL RATIO: 4.5 ratio — AB (ref 0.0–4.4)
Cholesterol, Total: 198 mg/dL (ref 100–199)
HDL: 44 mg/dL (ref >39)
LDL CALC: 106 mg/dL — AB (ref 0–99)
Triglycerides: 240 mg/dL — ABNORMAL HIGH (ref 0–149)
VLDL Cholesterol Cal: 48 mg/dL — ABNORMAL HIGH (ref 5–40)

## 2018-10-26 LAB — HEPATIC FUNCTION PANEL
ALBUMIN: 4.5 g/dL (ref 3.6–4.8)
ALK PHOS: 82 IU/L (ref 39–117)
ALT: 21 IU/L (ref 0–32)
AST: 10 IU/L (ref 0–40)
BILIRUBIN, DIRECT: 0.25 mg/dL (ref 0.00–0.40)
Bilirubin Total: 1.1 mg/dL (ref 0.0–1.2)
TOTAL PROTEIN: 6.7 g/dL (ref 6.0–8.5)

## 2018-11-04 ENCOUNTER — Telehealth: Payer: Self-pay

## 2018-11-04 NOTE — Telephone Encounter (Signed)
Notes recorded by Sigurd Sosapp, Avira Tillison, RN on 11/04/2018 at 8:29 AM EST Tried to leave message, but mailbox is full. Mailed results and notified patient to call office.

## 2018-11-04 NOTE — Telephone Encounter (Signed)
-----   Message from Allayne ButcherBrittainy M Simmons, [place] sent at 10/31/2018  8:23 PM EST ----- LDL (bad cholesterol) is 106. TG are elevated at 240. High cholesterol can lead to plaque build up/ blockages in the heart blood vessels. Try increasing Lipitor to 80 mg. Repeat FLP and HFTs again in 8 weeks. Keep plans to get coronary CTA.

## 2018-11-08 ENCOUNTER — Telehealth: Payer: Self-pay | Admitting: Interventional Cardiology

## 2018-11-08 NOTE — Telephone Encounter (Signed)
lpmtcb 12/10 

## 2018-11-08 NOTE — Telephone Encounter (Signed)
Please call with lab results 

## 2018-11-15 ENCOUNTER — Telehealth: Payer: Self-pay

## 2018-11-15 NOTE — Telephone Encounter (Signed)
lpmtcb 12/17 

## 2018-11-16 ENCOUNTER — Telehealth: Payer: Self-pay

## 2018-11-16 NOTE — Telephone Encounter (Signed)
lpmtcb 12/18 

## 2018-11-16 NOTE — Telephone Encounter (Signed)
Returning call.

## 2018-11-16 NOTE — Telephone Encounter (Signed)
Lpmtcb regarding lab results/recommendations.  I have made numerous attempts to contact her.  The coronary CT schedulers have attempted to reach her also.

## 2018-11-17 NOTE — Telephone Encounter (Signed)
lpmtcb 12/19

## 2018-11-21 NOTE — Telephone Encounter (Signed)
lpmtcb 12/23.  Informed her to call Jasmine DecemberSharon [phone] to schedule Coronary CTA.  I also told her to call me at [phone] to review medication changes and schedule lab work.

## 2018-11-24 NOTE — Telephone Encounter (Signed)
Tried reaching patient at home telephone number, but I got no answer and the mailbox is full. 12/26

## 2018-11-24 NOTE — Telephone Encounter (Signed)
lpmtcb 12/26 

## 2018-11-25 NOTE — Telephone Encounter (Signed)
lpmtcb 12/27 

## 2018-11-28 ENCOUNTER — Encounter: Payer: Self-pay | Admitting: Interventional Cardiology

## 2018-11-28 NOTE — Telephone Encounter (Signed)
lpmtcb 12/30 

## 2018-12-21 DIAGNOSIS — E785 Hyperlipidemia, unspecified: Secondary | ICD-10-CM

## 2018-12-22 ENCOUNTER — Telehealth: Payer: Self-pay

## 2018-12-22 DIAGNOSIS — Z01812 Encounter for preprocedural laboratory examination: Secondary | ICD-10-CM

## 2018-12-22 DIAGNOSIS — R079 Chest pain, unspecified: Secondary | ICD-10-CM

## 2018-12-22 MED ORDER — ATORVASTATIN CALCIUM 80 MG PO TABS: 80.0000 mg | ORAL_TABLET | Freq: Every day | ORAL | 3 refills | Status: DC

## 2018-12-22 MED ORDER — METOPROLOL TARTRATE 100 MG PO TABS: ORAL_TABLET | ORAL | 0 refills | Status: DC

## 2018-12-22 NOTE — Telephone Encounter (Signed)
I spoke to patient who would like to schedule the Coronary CT, her telephone did block our number.  She said that she would like a prescription written for some type of relaxation medication that she could take the day of.    I have arranged for her to come in on 1/30 (Thursday) to get BMET, pick up instructions and possibly prescription, if you permit.  We can put this all together on 1/29, the next time you are in clinic.  Thank you

## 2018-12-22 NOTE — Telephone Encounter (Signed)
Please arrive at the [facility] main entrance of [facility] at _______ AM (30-45 minutes prior to test start time)  [facility] [address] [place], [place] [address] [phone]  Proceed to the [facility] Radiology Department (First Floor).  Please follow these instructions carefully (unless otherwise directed):   On the Night Before the Test: . Be sure to Drink plenty of water. . Do not consume any caffeinated/decaffeinated beverages or chocolate 12 hours prior to your test. . Do not take any antihistamines 12 hours prior to your test.   On the Day of the Test: . Drink plenty of water. Do not drink any water within one hour of the test. . Do not eat any food 4 hours prior to the test. . You may take your regular medications prior to the test. . Do Not Take Metoprolol Succinate (Toprol) Morning of Test.   . Take metoprolol tartrate (Lopressor) 100 mg two hours prior to test.                           After the Test: . Drink plenty of water. . After receiving IV contrast, you may experience a mild flushed feeling. This is normal. . On occasion, you may experience a mild rash up to 24 hours after the test. This is not dangerous. If this occurs, you can take Benadryl 25 mg and increase your fluid intake. . If you experience trouble breathing, this can be serious. If it is severe call 911 IMMEDIATELY. If it is mild, please call our office.

## 2018-12-22 NOTE — Telephone Encounter (Signed)
Spoke to patient late on 12/21/2018.  We discussed the fact that we could not get in touch with each other over the past 2 months to review lab results and recommendations.  At her OV on 11/22 with Brittainy, apparently a Coronary CT was discussed, but could never get it scheduled because Omar Person could not reach her, including sending a letter.    The patient and I discovered that her cell phone was blocking our incoming calls for some reason, because when I tried her yesterday afternoon, her phone would not ring, nor did she receive VM.  I reached her by work phone given to me through MyChart by the patient and we discussed the labs, medication change (Atorvastatin increase to 80 mg) and labs (flp and hft) to be drawn 01/31/2019.  I will try to reach her today to discuss Coronary CT scheduling.

## 2018-12-29 ENCOUNTER — Other Ambulatory Visit: Payer: BLUE CROSS/BLUE SHIELD | Admitting: *Deleted

## 2018-12-29 DIAGNOSIS — Z01812 Encounter for preprocedural laboratory examination: Secondary | ICD-10-CM | POA: Diagnosis not present

## 2018-12-29 NOTE — Telephone Encounter (Signed)
Spoke with the patient, advised that she could come anytime today to get her BMET. She had no further questions.

## 2018-12-30 LAB — BASIC METABOLIC PANEL
BUN/Creatinine Ratio: 15 (ref 12–28)
BUN: 10 mg/dL (ref 8–27)
CO2: 22 mmol/L (ref 20–29)
Calcium: 9.4 mg/dL (ref 8.7–10.3)
Chloride: 99 mmol/L (ref 96–106)
Creatinine, Ser: 0.65 mg/dL (ref 0.57–1.00)
GFR calc Af Amer: 110 mL/min/{1.73_m2} (ref >59)
GFR calc non Af Amer: 95 mL/min/{1.73_m2} (ref >59)
Glucose: 85 mg/dL (ref 65–99)
Potassium: 4.5 mmol/L (ref 3.5–5.2)
Sodium: 139 mmol/L (ref 134–144)

## 2019-01-17 ENCOUNTER — Telehealth: Payer: Self-pay

## 2019-01-17 NOTE — Telephone Encounter (Signed)
Spoke to patient about the time differentials.  She verbalized understanding.

## 2019-01-20 ENCOUNTER — Telehealth: Payer: Self-pay

## 2019-01-20 NOTE — Telephone Encounter (Signed)
I called the patient to see if she had located her prescription written by Dr Eldridge Dace for pre Coronary CT sedation medication.  I had to leave a message. 2/21

## 2019-01-23 ENCOUNTER — Telehealth (HOSPITAL_COMMUNITY): Payer: Self-pay | Admitting: Emergency Medicine

## 2019-01-23 NOTE — Telephone Encounter (Signed)
Reaching out to patient to offer assistance regarding upcoming cardiac imaging study; pt verbalizes understanding of appt date/time, parking situation and where to check in, pre-test NPO status and medications ordered, and verified current allergies; name and call back number provided for further questions should they arise Peniel Biel RN Navigator Cardiac Imaging [facility] [phone] office [phone] cell 

## 2019-01-24 ENCOUNTER — Ambulatory Visit (HOSPITAL_COMMUNITY)
Admission: RE | Admit: 2019-01-24 | Discharge: 2019-01-24 | Disposition: A | Discharge status: Home / Self Care | Payer: BLUE CROSS/BLUE SHIELD | Source: Ambulatory Visit | Attending: Cardiology | Admitting: Cardiology

## 2019-01-24 ENCOUNTER — Encounter (HOSPITAL_COMMUNITY): Payer: Self-pay

## 2019-01-24 DIAGNOSIS — Z01812 Encounter for preprocedural laboratory examination: Secondary | ICD-10-CM

## 2019-01-24 DIAGNOSIS — R079 Chest pain, unspecified: Secondary | ICD-10-CM | POA: Diagnosis not present

## 2019-01-24 MED ORDER — NITROGLYCERIN 0.4 MG SL SUBL
SUBLINGUAL_TABLET | SUBLINGUAL | Status: AC
  Administered 2019-01-24: 0.8 mg via SUBLINGUAL
  Filled 2019-01-24: qty 2

## 2019-01-24 MED ORDER — METOPROLOL TARTRATE 5 MG/5ML IV SOLN
5.0000 mg | INTRAVENOUS | Status: DC | PRN
  Administered 2019-01-24: 5 mg via INTRAVENOUS

## 2019-01-24 MED ORDER — NITROGLYCERIN 0.4 MG SL SUBL
0.8000 mg | SUBLINGUAL_TABLET | Freq: Once | SUBLINGUAL | Status: AC
  Administered 2019-01-24: 0.8 mg via SUBLINGUAL

## 2019-01-24 MED ORDER — IOPAMIDOL (ISOVUE-370) INJECTION 76%
80.0000 mL | Freq: Once | INTRAVENOUS | Status: AC | PRN
  Administered 2019-01-24: 80 mL via INTRAVENOUS

## 2019-01-24 MED ORDER — METOPROLOL TARTRATE 5 MG/5ML IV SOLN
INTRAVENOUS | Status: AC
  Administered 2019-01-24: 5 mg via INTRAVENOUS
  Filled 2019-01-24: qty 15

## 2019-01-26 ENCOUNTER — Other Ambulatory Visit: Payer: Self-pay

## 2019-01-26 MED ORDER — ASPIRIN EC 81 MG PO TBEC: 81.0000 mg | DELAYED_RELEASE_TABLET | Freq: Every day | ORAL | 3 refills | Status: AC

## 2019-01-26 MED ORDER — NITROGLYCERIN 0.4 MG SL SUBL: 0.4000 mg | SUBLINGUAL_TABLET | SUBLINGUAL | 3 refills | Status: DC | PRN

## 2019-01-27 NOTE — Progress Notes (Signed)
Cardiology Office Note   Date:  01/30/2019   ID:  [name], [name] 1956/11/05, MRN [id number]  PCP:  Brandy Rainier, MD    No chief complaint on file.  CAD  Wt Readings from Last 3 Encounters:  01/30/19 134 lb 6.4 oz (61 kg)  10/21/18 135 lb (61.2 kg)  09/09/17 132 lb 3.2 oz (60 kg)       History of Present Illness: Brandy Meyer is a 63 y.o. female  who had a negative cardiac w/u in 2011. SHe had a Holter monitor placed and her average HR was >100. SHe was placed on a beta blocker several years ago. LV function was normal in the past.   She had hip surgery in 5/18 which was successful. She is walking more.  No cardiac issues at the time of surgery.   Coronary CT in 12/2018 showed: Coronary Arteries: Right dominant with no anomalies  LM: Calcified plaque ostial left main with mild (<50%) stenosis.  LAD system: Calcified plaque proximal LAD, suspect mild (<50%) stenosis. Moderate D1 with calcified plaque, mild (<50%) stenosis.  Circumflex system: Small ramus with mixed plaque, possible moderate stenosis proximally (51-69%). There was a moderate OM1 with no plaque or stenosis. The AV LCx beyond OM1 was very small, no plaque or stenosis.  RCA system: Mixed plaque proximal to mid RCA with mild (<50%) stenosis. Mixed plaque mid PDA with probably mild (<50%) stenosis.  IMPRESSION: 1. Coronary artery calcium score 1315 Agatston units. This places the patient in the 99th percentile for age and gender, suggesting high risk for future cardiac events.  2. Extensive plaque but probably nonobstructive disease. Most significant stenosis appears to be moderate disease in a small ramus. I will send for FFR to confirm.  FFR 0.91 RCA  FFR nonsignificant in LAD  FFR 0.87 mid LCx, 0.80 distal LCx.  IMPRESSION: Borderline significant fall in FFR in the LCx. Would recommend medical management unless concerning symptoms present.  Brandy Meyer  Since the  CT scan, she has felt well.  Walking is her most strenuous activity.  Her exercise tolerance has improved.  She can now go for 30-45 minutes.    Past Medical History:  Diagnosis Date  . Anxiety   . Arthritis   . History of bronchitis    15 yrs ago  . Hyperlipidemia    takes Atorvastatin daily  . Hypertension    takes Metoprolol daily  . Joint pain   . Nocturia   . Osteopenia    by DEX[facility] scan at [facility] on January 30, 2009  . Pneumonia    hx of-20 yrs ago    Past Surgical History:  Procedure Laterality Date  . BACK SURGERY     x  2  . CESAREAN SECTION    . COLONOSCOPY    . LUNG SURGERY Left 2003   saw a spot on her lung , removed it, benign  . TOTAL HIP ARTHROPLASTY Left 03/30/2017   Procedure: LEFT TOTAL HIP ARTHROPLASTY ANTERIOR APPROACH;  Surgeon: Brandy Hitch, MD;  Location: [facility] OR;  Service: Orthopedics;  Laterality: Left;     Current Outpatient Medications  Medication Sig Dispense Refill  . aspirin EC 81 MG tablet Take 1 tablet (81 mg total) by mouth daily. 90 tablet 3  . atorvastatin (LIPITOR) 80 MG tablet Take 1 tablet (80 mg total) by mouth daily. 90 tablet 3  . fluticasone (FLONASE) 50 MCG/ACT nasal spray Place 2 sprays into both nostrils every evening.    Marland Kitchen  metoprolol succinate (TOPROL-XL) 25 MG 24 hr tablet Take 1 tablet (25 mg total) by mouth daily. 90 tablet 3  . nitroGLYCERIN (NITROSTAT) 0.4 MG SL tablet Place 1 tablet (0.4 mg total) under the tongue every 5 (five) minutes as needed for chest pain. 25 tablet 3   No current facility-administered medications for this visit.     Allergies:   Shellfish allergy; Effexor [venlafaxine]; and Penicillins    Social History:  The patient  reports that she has quit smoking. She has never used smokeless tobacco. She reports current alcohol use. She reports that she does not use drugs.   Family History:  The patient's family history includes Alcoholism in her father; Heart attack in her mother; Lung cancer in her  father.    ROS:  Please see the history of present illness.   Otherwise, review of systems are positive for increasing exercise tolerance.   All other systems are reviewed and negative.    PHYSICAL EXAM: VS:  BP 114/70   Pulse 83   Ht 5\' 1"  (1.549 m)   Wt 134 lb 6.4 oz (61 kg)   SpO2 94%   BMI 25.39 kg/m  , BMI Body mass index is 25.39 kg/m. GEN: Well nourished, well developed, in no acute distress  HEENT: normal  Neck: no JVD, carotid bruits, or masses Cardiac: RRR; no murmurs, rubs, or gallops,no edema  Respiratory:  clear to auscultation bilaterally, normal work of breathing GI: soft, nontender, nondistended, + BS MS: no deformity or atrophy  Skin: warm and dry, no rash Neuro:  Strength and sensation are intact Psych: euthymic mood, full affect    Recent Labs: 10/26/2018: ALT 21 12/29/2018: BUN 10; Creatinine, Ser 0.65; Potassium 4.5; Sodium 139   Lipid Panel    Component Value Date/Time   CHOL 198 10/26/2018 0831   TRIG 240 (H) 10/26/2018 0831   HDL 44 10/26/2018 0831   CHOLHDL 4.5 (H) 10/26/2018 0831   CHOLHDL 4.3 04/15/2016 0841   VLDL 39 (H) 04/15/2016 0841   LDLCALC 106 (H) 10/26/2018 0831   LDLDIRECT 127.0 09/05/2014 0851     Other studies Reviewed: Additional studies/ records that were reviewed today with results demonstrating: **CT results noted above.   ASSESSMENT AND PLAN:  1. CAD: Moderate by coronary CT, but high percentile. no angina.  Continue aggressive medical therapy.  If we cannot control her LDL with statin, would have to consider PCSK9 inhibitor.  She does have 41 significant disease in her circumflex but is currently symptom-free on medical therapy. 2. Hyperlipidemia: Atorvastatin was recently increased to 80 mg daily.  Will recheck lipids in about 6 weeks.  We also spoke about decreasing red meat intake.  She needs to eat more of a plant-based diet as well. 3. Nitroglycerin was also called in for her.  I explained why this would be used.   Hopefully, she will not need it since she is really not had angina.   Current medicines are reviewed at length with the patient today.  The patient concerns regarding her medicines were addressed.  The following changes have been made:  No change  Labs/ tests ordered today include: lipids No orders of the defined types were placed in this encounter.   Recommend 150 minutes/week of aerobic exercise Low fat, low carb, high fiber diet recommended  Disposition:   FU in 6 months   Signed, Lance Muss, MD  01/30/2019 9:37 AM    [facility] [address] [place], [place],  [place]  [address] Phone: [phone]; Fax: [phone]

## 2019-01-30 ENCOUNTER — Ambulatory Visit: Payer: BLUE CROSS/BLUE SHIELD | Admitting: Interventional Cardiology

## 2019-01-30 ENCOUNTER — Encounter: Payer: Self-pay | Admitting: Interventional Cardiology

## 2019-01-30 ENCOUNTER — Other Ambulatory Visit: Payer: BLUE CROSS/BLUE SHIELD

## 2019-01-30 VITALS — BP 114/70 | HR 83 | Ht 61.0 in | Wt 134.4 lb

## 2019-01-30 DIAGNOSIS — E785 Hyperlipidemia, unspecified: Secondary | ICD-10-CM

## 2019-01-30 DIAGNOSIS — I25118 Atherosclerotic heart disease of native coronary artery with other forms of angina pectoris: Secondary | ICD-10-CM

## 2019-01-30 NOTE — Patient Instructions (Addendum)
Medication Instructions:  Your physician recommends that you continue on your current medications as directed. Please refer to the Current Medication list given to you today.  If you need a refill on your cardiac medications before your next appointment, please call your pharmacy.   Lab work: Your physician recommends that you return for a FASTING lipid profile and liver function panel on 03/13/19   If you have labs (blood work) drawn today and your tests are completely normal, you will receive your results only by: Marland Kitchen MyChart Message (if you have MyChart) OR . A paper copy in the mail If you have any lab test that is abnormal or we need to change your treatment, we will call you to review the results.  Testing/Procedures: None ordered  Follow-Up: At [facility], you and your health needs are our priority.  As part of our continuing mission to provide you with exceptional heart care, we have created designated Provider Care Teams.  These Care Teams include your primary Cardiologist (physician) and Advanced Practice Providers (APPs -  Physician Assistants and Nurse Practitioners) who all work together to provide you with the care you need, when you need it. . You will need a follow up appointment in 6 months.  Please call our office 2 months in advance to schedule this appointment.  You may see Everette Rank, MD or one of the following Advanced Practice Providers on your designated Care Team:   . Robbie Lis, PA-C . Dayna Dunn, PA-C . Jacolyn Reedy, PA-C  Any Other Special Instructions Will Be Listed Below (If Applicable).    Heart-Healthy Eating Plan Heart-healthy meal planning includes:  Eating less unhealthy fats.  Eating more healthy fats.  Making other changes in your diet. Talk with your doctor or a diet specialist (dietitian) to create an eating plan that is right for you. What is my plan? Your doctor may recommend an eating plan that includes:  Total fat: ______%  or less of total calories a day.  Saturated fat: ______% or less of total calories a day.  Cholesterol: less than _________mg a day. What are tips for following this plan? Cooking Avoid frying your food. Try to bake, boil, grill, or broil it instead. You can also reduce fat by:  Removing the skin from poultry.  Removing all visible fats from meats.  Steaming vegetables in water or broth. Meal planning   At meals, divide your plate into four equal parts: ? Fill one-half of your plate with vegetables and green salads. ? Fill one-fourth of your plate with whole grains. ? Fill one-fourth of your plate with lean protein foods.  Eat 4-5 servings of vegetables per day. A serving of vegetables is: ? 1 cup of raw or cooked vegetables. ? 2 cups of raw leafy greens.  Eat 4-5 servings of fruit per day. A serving of fruit is: ? 1 medium whole fruit. ?  cup of dried fruit. ?  cup of fresh, frozen, or canned fruit. ?  cup of 100% fruit juice.  Eat more foods that have soluble fiber. These are apples, broccoli, carrots, beans, peas, and barley. Try to get 20-30 g of fiber per day.  Eat 4-5 servings of nuts, legumes, and seeds per week: ? 1 serving of dried beans or legumes equals  cup after being cooked. ? 1 serving of nuts is  cup. ? 1 serving of seeds equals 1 tablespoon. General information  Eat more home-cooked food. Eat less restaurant, buffet, and fast food.  Limit or avoid alcohol.  Limit foods that are high in starch and sugar.  Avoid fried foods.  Lose weight if you are overweight.  Keep track of how much salt (sodium) you eat. This is important if you have high blood pressure. Ask your doctor to tell you more about this.  Try to add vegetarian meals each week. Fats  Choose healthy fats. These include olive oil and canola oil, flaxseeds, walnuts, almonds, and seeds.  Eat more omega-3 fats. These include salmon, mackerel, sardines, tuna, flaxseed oil, and ground  flaxseeds. Try to eat fish at least 2 times each week.  Check food labels. Avoid foods with trans fats or high amounts of saturated fat.  Limit saturated fats. ? These are often found in animal products, such as meats, butter, and cream. ? These are also found in plant foods, such as palm oil, palm kernel oil, and coconut oil.  Avoid foods with partially hydrogenated oils in them. These have trans fats. Examples are stick margarine, some tub margarines, cookies, crackers, and other baked goods. What foods can I eat? Fruits All fresh, canned (in natural juice), or frozen fruits. Vegetables Fresh or frozen vegetables (raw, steamed, roasted, or grilled). Green salads. Grains Most grains. Choose whole wheat and whole grains most of the time. Rice and pasta, including brown rice and pastas made with whole wheat. Meats and other proteins Lean, well-trimmed beef, veal, pork, and lamb. Chicken and [place] without skin. All fish and shellfish. Wild duck, rabbit, pheasant, and venison. Egg whites or low-cholesterol egg substitutes. Dried beans, peas, lentils, and tofu. Seeds and most nuts. Dairy Low-fat or nonfat cheeses, including ricotta and mozzarella. Skim or 1% milk that is liquid, powdered, or evaporated. Buttermilk that is made with low-fat milk. Nonfat or low-fat yogurt. Fats and oils Non-hydrogenated (trans-free) margarines. Vegetable oils, including soybean, sesame, sunflower, olive, peanut, safflower, corn, canola, and cottonseed. Salad dressings or mayonnaise made with a vegetable oil. Beverages Mineral water. Coffee and tea. Diet carbonated beverages. Sweets and desserts Sherbet, gelatin, and fruit ice. Small amounts of dark chocolate. Limit all sweets and desserts. Seasonings and condiments All seasonings and condiments. The items listed above may not be a complete list of foods and drinks you can eat. Contact a dietitian for more options. What foods should I avoid? Fruits Canned  fruit in heavy syrup. Fruit in cream or butter sauce. Fried fruit. Limit coconut. Vegetables Vegetables cooked in cheese, cream, or butter sauce. Fried vegetables. Grains Breads that are made with saturated or trans fats, oils, or whole milk. Croissants. Sweet rolls. Donuts. High-fat crackers, such as cheese crackers. Meats and other proteins Fatty meats, such as hot dogs, ribs, sausage, bacon, rib-eye roast or steak. High-fat deli meats, such as salami and bologna. Caviar. Domestic duck and goose. Organ meats, such as liver. Dairy Cream, sour cream, cream cheese, and creamed cottage cheese. Whole-milk cheeses. Whole or 2% milk that is liquid, evaporated, or condensed. Whole buttermilk. Cream sauce or high-fat cheese sauce. Yogurt that is made from whole milk. Fats and oils Meat fat, or shortening. Cocoa butter, hydrogenated oils, palm oil, coconut oil, palm kernel oil. Solid fats and shortenings, including bacon fat, salt pork, lard, and butter. Nondairy cream substitutes. Salad dressings with cheese or sour cream. Beverages Regular sodas and juice drinks with added sugar. Sweets and desserts Frosting. Pudding. Cookies. Cakes. Pies. Milk chocolate or white chocolate. Buttered syrups. Full-fat ice cream or ice cream drinks. The items listed above may not be a complete  list of foods and drinks to avoid. Contact a dietitian for more information. Summary  Heart-healthy meal planning includes eating less unhealthy fats, eating more healthy fats, and making other changes in your diet.  Eat a balanced diet. This includes fruits and vegetables, low-fat or nonfat dairy, lean protein, nuts and legumes, whole grains, and heart-healthy oils and fats. This information is not intended to replace advice given to you by your health care provider. Make sure you discuss any questions you have with your health care provider. Document Released: 05/17/2012 Document Revised: 12/24/2017 Document Reviewed:  12/24/2017 Elsevier Interactive Patient Education  2019 [employer].

## 2019-01-31 ENCOUNTER — Other Ambulatory Visit: Payer: BLUE CROSS/BLUE SHIELD

## 2019-03-13 ENCOUNTER — Other Ambulatory Visit: Payer: BLUE CROSS/BLUE SHIELD

## 2019-04-04 ENCOUNTER — Other Ambulatory Visit: Payer: Self-pay | Admitting: Cardiology

## 2019-04-04 ENCOUNTER — Other Ambulatory Visit: Payer: BLUE CROSS/BLUE SHIELD

## 2019-04-04 ENCOUNTER — Other Ambulatory Visit: Payer: Self-pay

## 2019-04-04 DIAGNOSIS — E785 Hyperlipidemia, unspecified: Secondary | ICD-10-CM | POA: Diagnosis not present

## 2019-04-04 LAB — HEPATIC FUNCTION PANEL
ALT: 21 IU/L (ref 0–32)
AST: 11 IU/L (ref 0–40)
Albumin: 5 g/dL — ABNORMAL HIGH (ref 3.8–4.8)
Alkaline Phosphatase: 82 IU/L (ref 39–117)
Bilirubin Total: 1.1 mg/dL (ref 0.0–1.2)
Bilirubin, Direct: 0.23 mg/dL (ref 0.00–0.40)
Total Protein: 7.2 g/dL (ref 6.0–8.5)

## 2019-04-04 LAB — LIPID PANEL
Chol/HDL Ratio: 3.7 ratio (ref 0.0–4.4)
Cholesterol, Total: 194 mg/dL (ref 100–199)
HDL: 52 mg/dL (ref >39)
LDL Calculated: 105 mg/dL — ABNORMAL HIGH (ref 0–99)
Triglycerides: 184 mg/dL — ABNORMAL HIGH (ref 0–149)
VLDL Cholesterol Cal: 37 mg/dL (ref 5–40)

## 2019-04-06 ENCOUNTER — Telehealth: Payer: Self-pay

## 2019-04-06 DIAGNOSIS — E785 Hyperlipidemia, unspecified: Secondary | ICD-10-CM

## 2019-04-06 DIAGNOSIS — Z79899 Other long term (current) drug therapy: Secondary | ICD-10-CM

## 2019-04-06 NOTE — Telephone Encounter (Signed)
-----   Message from [place], [place] sent at 04/06/2019 12:03 PM EDT ----- Unfortunately, her cholesterol is still not at recommended goal. Given her CAD, LDL needs to be < 70 and also recommend TGs be less than 150. Her Lipitor was recently increased to 80 mg nightly. Please call to check compliance. Has she been taking consistently or is she having any side effects? Please report back and I will make changes based on her response. We may need to change to Crestor if she has been compliant w/ Lipitor. Also ask about diet. Has she reduced intake of red meat as recently advised by Dr. Eldridge Dace?

## 2019-04-06 NOTE — Telephone Encounter (Signed)
Notes recorded by Sigurd Sos, RN on 04/06/2019 at 1:10 PM EDT The patient has been notified of the result and verbalized understanding. She has been taking her Atorvastatin 80 mg nightly, but says that she has not reduced her red meat intake. All questions (if any) were answered. Sigurd Sos, RN 04/06/2019 1:09 PM   ------

## 2019-04-07 MED ORDER — ROSUVASTATIN CALCIUM 40 MG PO TABS: 40.0000 mg | ORAL_TABLET | Freq: Every day | ORAL | 3 refills | Status: DC

## 2019-04-07 NOTE — Telephone Encounter (Signed)
I spoke to the patient with Brittainy's recommendations.  We will stop Lipitor and start Crestor 40 mg nightly.  The patient will reduce red meat intake and return 06/06/19 for labs.  The patient will let us know of any side effects.

## 2019-04-07 NOTE — Telephone Encounter (Signed)
It is very important that we try to get her cholesterol down. LDL needs to be < 70 mg/dl to prevent CAD from getting worse and to reduce chances of having a heart attack. She is on maximum does of Lipitor. Lets stop Lipitor and change to Crestor 40 mg nightly and work hard to reduce red meat intake. Repeat FLP and HFTs in 8 weeks. Let us know if any muscle aches w/ Crestor.

## 2019-06-06 ENCOUNTER — Other Ambulatory Visit: Payer: BLUE CROSS/BLUE SHIELD

## 2019-06-08 ENCOUNTER — Other Ambulatory Visit: Payer: Self-pay

## 2019-06-08 ENCOUNTER — Other Ambulatory Visit: Payer: BC Managed Care – PPO | Admitting: *Deleted

## 2019-06-08 DIAGNOSIS — Z79899 Other long term (current) drug therapy: Secondary | ICD-10-CM | POA: Diagnosis not present

## 2019-06-08 DIAGNOSIS — E785 Hyperlipidemia, unspecified: Secondary | ICD-10-CM

## 2019-06-08 LAB — HEPATIC FUNCTION PANEL
ALT: 22 IU/L (ref 0–32)
AST: 13 IU/L (ref 0–40)
Albumin: 4.7 g/dL (ref 3.8–4.8)
Alkaline Phosphatase: 68 IU/L (ref 39–117)
Bilirubin Total: 1 mg/dL (ref 0.0–1.2)
Bilirubin, Direct: 0.23 mg/dL (ref 0.00–0.40)
Total Protein: 6.6 g/dL (ref 6.0–8.5)

## 2019-06-08 LAB — LIPID PANEL
Chol/HDL Ratio: 3.6 ratio (ref 0.0–4.4)
Cholesterol, Total: 159 mg/dL (ref 100–199)
HDL: 44 mg/dL (ref >39)
LDL Calculated: 74 mg/dL (ref 0–99)
Triglycerides: 204 mg/dL — ABNORMAL HIGH (ref 0–149)
VLDL Cholesterol Cal: 41 mg/dL — ABNORMAL HIGH (ref 5–40)

## 2019-06-14 ENCOUNTER — Telehealth: Payer: Self-pay

## 2019-06-14 DIAGNOSIS — E785 Hyperlipidemia, unspecified: Secondary | ICD-10-CM

## 2019-06-14 DIAGNOSIS — Z79899 Other long term (current) drug therapy: Secondary | ICD-10-CM

## 2019-06-14 MED ORDER — EZETIMIBE 10 MG PO TABS: 10.0000 mg | ORAL_TABLET | Freq: Every day | ORAL | 3 refills | Status: DC

## 2019-06-14 NOTE — Telephone Encounter (Signed)
Notes recorded by Frederik Schmidt, RN on 06/14/2019 at 8:19 AM EDT  [id number] 7/15  ------

## 2019-06-14 NOTE — Telephone Encounter (Signed)
Notes recorded by Frederik Schmidt, RN on 06/14/2019 at 2:35 PM EDT  The patient has been notified of the result and verbalized understanding. All questions (if any) were answered.  Frederik Schmidt, RN 06/14/2019 2:35 PM

## 2019-06-14 NOTE — Telephone Encounter (Signed)
-----   Message from Brittainy M Simmons, PA-C sent at 06/12/2019  4:21 PM EDT ----- LDL (bad cholesterol) is overall improved, down from 105 to 74 mg/dL. Still slightly above goal. Given her known plaque in her heart arteries and strong family history, we really need to get LDL < 70 to prevent plaque from getting worse and to reduce the risk of having a heart attack. She is already maxed out on Crestor. Add Zetia 10 mg to regimen and repeat FLP and HFTs in 8 weeks. Zetia should help to get her finally to her goal. Continue Crestor 40 mg nightly. 

## 2019-06-14 NOTE — Telephone Encounter (Signed)
-----   Message from [place], [place] sent at 06/12/2019  4:21 PM EDT ----- LDL (bad cholesterol) is overall improved, down from 105 to 74 mg/dL. Still slightly above goal. Given her known plaque in her heart arteries and strong family history, we really need to get LDL < 70 to prevent plaque from getting worse and to reduce the risk of having a heart attack. She is already maxed out on Crestor. Add Zetia 10 mg to regimen and repeat FLP and HFTs in 8 weeks. Zetia should help to get her finally to her goal. Continue Crestor 40 mg nightly.

## 2019-08-15 ENCOUNTER — Other Ambulatory Visit: Payer: BC Managed Care – PPO

## 2019-08-16 ENCOUNTER — Other Ambulatory Visit: Payer: BC Managed Care – PPO | Admitting: *Deleted

## 2019-08-16 ENCOUNTER — Other Ambulatory Visit: Payer: Self-pay

## 2019-08-16 DIAGNOSIS — Z79899 Other long term (current) drug therapy: Secondary | ICD-10-CM | POA: Diagnosis not present

## 2019-08-16 DIAGNOSIS — E785 Hyperlipidemia, unspecified: Secondary | ICD-10-CM

## 2019-08-16 LAB — HEPATIC FUNCTION PANEL
ALT: 43 IU/L — ABNORMAL HIGH (ref 0–32)
AST: 20 IU/L (ref 0–40)
Albumin: 4.6 g/dL (ref 3.8–4.8)
Alkaline Phosphatase: 75 IU/L (ref 39–117)
Bilirubin Total: 0.8 mg/dL (ref 0.0–1.2)
Bilirubin, Direct: 0.2 mg/dL (ref 0.00–0.40)
Total Protein: 6.9 g/dL (ref 6.0–8.5)

## 2019-08-16 LAB — LIPID PANEL
Chol/HDL Ratio: 3.1 ratio (ref 0.0–4.4)
Cholesterol, Total: 130 mg/dL (ref 100–199)
HDL: 42 mg/dL (ref >39)
LDL Chol Calc (NIH): 56 mg/dL (ref 0–99)
Triglycerides: 192 mg/dL — ABNORMAL HIGH (ref 0–149)
VLDL Cholesterol Cal: 32 mg/dL (ref 5–40)

## 2019-08-18 ENCOUNTER — Telehealth: Payer: Self-pay

## 2019-08-18 NOTE — Telephone Encounter (Signed)
-----   Message from Brandy Meyer, [place] sent at 08/18/2019  4:01 PM EDT ----- LDL is finally < 70 at 52 after adding Zetia. Liver enzymes ok. Continue current regimen and routine f/u with Dr. Irish Lack.

## 2019-08-18 NOTE — Telephone Encounter (Signed)
Notes recorded by Frederik Schmidt, RN on 08/18/2019 at 4:19 PM EDT  The patient has been notified of the result and verbalized understanding. All questions (if any) were answered.  Frederik Schmidt, RN 08/18/2019 4:19 PM

## 2019-08-28 DIAGNOSIS — M19032 Primary osteoarthritis, left wrist: Secondary | ICD-10-CM | POA: Diagnosis not present

## 2019-08-28 DIAGNOSIS — M25532 Pain in left wrist: Secondary | ICD-10-CM | POA: Diagnosis not present

## 2019-08-28 DIAGNOSIS — M189 Osteoarthritis of first carpometacarpal joint, unspecified: Secondary | ICD-10-CM | POA: Diagnosis not present

## 2019-08-28 DIAGNOSIS — M1812 Unilateral primary osteoarthritis of first carpometacarpal joint, left hand: Secondary | ICD-10-CM | POA: Diagnosis not present

## 2019-09-12 ENCOUNTER — Encounter: Payer: Self-pay | Admitting: Physician Assistant

## 2019-09-12 NOTE — Progress Notes (Deleted)
Cardiology Office Note    Date:  09/12/2019   ID:  Brandy Meyer, DOB 1956/03/04, MRN [id number]  PCP:  Leighton Ruff, MD  Cardiologist:  Larae Grooms, MD  Electrophysiologist:  None   Chief Complaint: f/u CAD, sinus tachycardia  History of Present Illness:   Brandy Meyer is a 63 y.o. female with history of sinus tachycardia, CAD by coronary CT 12/2018, possible small PFO by coronary CT 12/2018, HLD, prediabetes, former tobacco abuse, and family history of CAD/sudden cardiac death (mother had a massive MI in her early 66s and died suddenly). She has been followed by Dr. Irish Lack primarily for sinus tachycardia in the past, but is also now known to have CAD by coronary CT. Per records, she had negative cardiac w/u in 2011. She had a Holter monitor placed and her average HR was >100. She was placed on a beta blocker several years ago. 2D echo 2011 showed normal LV function, impaired relaxation, otherwise unremarkable. She underwent coronary CTA in 12/2018 for chest pain showing 99th percentile calcium, small PFO, extensive coronary plaque as outlined below, with borderline significant fall in FFR in the LCx, medical therapy recommended. She saw Dr. Irish Lack in follow-up 01/2019 and was doing well without angina so continued medical therapy recommended. Last labs 08/2019 showed LFTs with mild ALT elevation otherwise normal, triglycerides 192, LDL 56, 09/2018 K 4.4, Cr 0.67, no recent CBC or TSH in the last 2 years. ***   lfttrig Vascepa? Last cbc 03/2017 9 s/p hip  CAD Sinus tachycardia Hyperlipidemia Small PFO Mildly abnormal ALT level    Past Medical History:  Diagnosis Date  . Anxiety   . Arthritis   . CAD in native artery    a. by coronary CT - medical therapy.  Marland Kitchen History of bronchitis    15 yrs ago  . Hyperlipidemia    takes Atorvastatin daily  . Hypertension    takes Metoprolol daily  . Joint pain   . Nocturia   . Osteopenia    by DEXA scan at [facility] on January 30, 2009  . Pneumonia    hx of-20 yrs ago  . Sinus tachycardia     Past Surgical History:  Procedure Laterality Date  . BACK SURGERY     x  2  . CESAREAN SECTION    . COLONOSCOPY    . LUNG SURGERY Left 2003   saw a spot on her lung , removed it, benign  . TOTAL HIP ARTHROPLASTY Left 03/30/2017   Procedure: LEFT TOTAL HIP ARTHROPLASTY ANTERIOR APPROACH;  Surgeon: Mcarthur Rossetti, MD;  Location: [place];  Service: Orthopedics;  Laterality: Left;    Current Medications: No outpatient medications have been marked as taking for the 09/14/19 encounter (Appointment) with Charlie Pitter, PA-C.   ***   Allergies:   Shellfish allergy, Effexor [venlafaxine], and Penicillins   Social History   Socioeconomic History  . Marital status: Married    Spouse name: Not on file  . Number of children: Not on file  . Years of education: Not on file  . Highest education level: Not on file  Occupational History  . Not on file  Social Needs  . Financial resource strain: Not on file  . Food insecurity    Worry: Not on file    Inability: Not on file  . Transportation needs    Medical: Not on file    Non-medical: Not on file  Tobacco Use  . Smoking status: Former  Smoker  . Smokeless tobacco: Never Used  . Tobacco comment: quit smoking 11 yrs ago  Substance and Sexual Activity  . Alcohol use: Yes    Comment: social  . Drug use: No  . Sexual activity: Not on file  Lifestyle  . Physical activity    Days per week: Not on file    Minutes per session: Not on file  . Stress: Not on file  Relationships  . Social Musicianconnections    Talks on phone: Not on file    Gets together: Not on file    Attends religious service: Not on file    Active member of club or organization: Not on file    Attends meetings of clubs or organizations: Not on file    Relationship status: Not on file  Other Topics Concern  . Not on file  Social History Narrative  . Not on file     Family History:  The  patient's ***family history includes Alcoholism in her father; Heart attack in her mother; Lung cancer in her father.  ROS:   Please see the history of present illness. Otherwise, review of systems is positive for ***.  All other systems are reviewed and otherwise negative.    EKGs/Labs/Other Studies Reviewed:    Studies reviewed were summarized above.    Coronary CTA ADDENDUM REPORT: 01/25/2019 13:53  CLINICAL DATA:  Chest pain  EXAM: Cardiac CTA  MEDICATIONS: Sub lingual nitro. 4mg  x 2  TECHNIQUE: The patient was scanned on a Siemens 192 slice scanner. Gantry rotation speed was 250 msecs. Collimation was 0.6 mm. A 100 kV prospective scan was triggered in the ascending thoracic aorta at 35-75% of the R-R interval. Average HR during the scan was 60 bpm. The 3D data set was interpreted on a dedicated work station using MPR, MIP and VRT modes. A total of 80cc of contrast was used.  FINDINGS: Non-cardiac: [facility].  There appears to be a small PFO. The pulmonary veins drain normally to the left atrium.  Calcium Score: 1315 Agatston units.  Coronary Arteries: Right dominant with no anomalies  LM: Calcified plaque ostial left main with mild (<50%) stenosis.  LAD system: Calcified plaque proximal LAD, suspect mild (<50%) stenosis. Moderate D1 with calcified plaque, mild (<50%) stenosis.  Circumflex system: Small ramus with mixed plaque, possible moderate stenosis proximally (51-69%). There was a moderate OM1 with no plaque or stenosis. The AV LCx beyond OM1 was very small, no plaque or stenosis.  RCA system: Mixed plaque proximal to mid RCA with mild (<50%) stenosis. Mixed plaque mid PDA with probably mild (<50%) stenosis.  IMPRESSION: 1. Coronary artery calcium score 1315 Agatston units. This places the patient in the 99th percentile for age and gender, suggesting high risk for future cardiac events.  2.  Extensive plaque but probably nonobstructive disease. Most significant stenosis appears to be moderate disease in a small ramus. I will send for FFR to confirm.  Brandy Meyer  EKG:  EKG is ordered today, personally reviewed, demonstrating ***  Recent Labs: 12/29/2018: BUN 10; Creatinine, Ser 0.65; Potassium 4.5; Sodium 139 08/16/2019: ALT 43  Recent Lipid Panel    Component Value Date/Time   CHOL 130 08/16/2019 0844   TRIG 192 (H) 08/16/2019 0844   HDL 42 08/16/2019 0844   CHOLHDL 3.1 08/16/2019 0844   CHOLHDL 4.3 04/15/2016 0841   VLDL 39 (H) 04/15/2016 0841   LDLCALC 56 08/16/2019 0844   LDLDIRECT 127.0 09/05/2014 [id number]  PHYSICAL EXAM:    VS:  There were no vitals taken for this visit.  BMI: There is no height or weight on file to calculate BMI.  GEN: Well nourished, well developed, in no acute distress HEENT: normocephalic, atraumatic Neck: no JVD, carotid bruits, or masses Cardiac: ***RRR; no murmurs, rubs, or gallops, no edema  Respiratory:  clear to auscultation bilaterally, normal work of breathing GI: soft, nontender, nondistended, + BS MS: no deformity or atrophy Skin: warm and dry, no rash Neuro:  Alert and Oriented x 3, Strength and sensation are intact, follows commands Psych: euthymic mood, full affect  Wt Readings from Last 3 Encounters:  01/30/19 134 lb 6.4 oz (61 kg)  10/21/18 135 lb (61.2 kg)  09/09/17 132 lb 3.2 oz (60 kg)     ASSESSMENT & PLAN:   1. ***  Disposition: F/u with ***   Medication Adjustments/Labs and Tests Ordered: Current medicines are reviewed at length with the patient today.  Concerns regarding medicines are outlined above. Medication changes, Labs and Tests ordered today are summarized above and listed in the Patient Instructions accessible in Encounters.   Signed, Laurann Montana, PA-C  09/12/2019 1:13 PM    [facility] [address] [place], [place], [place]  [address] Phone: [phone]; Fax: [phone]

## 2019-09-14 ENCOUNTER — Ambulatory Visit: Payer: BC Managed Care – PPO | Admitting: Physician Assistant

## 2019-09-14 DIAGNOSIS — J029 Acute pharyngitis, unspecified: Secondary | ICD-10-CM | POA: Diagnosis not present

## 2019-09-14 DIAGNOSIS — R519 Headache, unspecified: Secondary | ICD-10-CM | POA: Diagnosis not present

## 2019-09-15 DIAGNOSIS — R519 Headache, unspecified: Secondary | ICD-10-CM | POA: Diagnosis not present

## 2019-09-15 DIAGNOSIS — J029 Acute pharyngitis, unspecified: Secondary | ICD-10-CM | POA: Diagnosis not present

## 2019-11-07 NOTE — Progress Notes (Signed)
Virtual Visit via Video Note   This visit type was conducted due to national recommendations for restrictions regarding the COVID-19 Pandemic (e.g. social distancing) in an effort to limit this patient's exposure and mitigate transmission in our community.  Due to her co-morbid illnesses, this patient is at least at moderate risk for complications without adequate follow up.  This format is felt to be most appropriate for this patient at this time.  All issues noted in this document were discussed and addressed.  A limited physical exam was performed with this format.  Please refer to the patient's chart for her consent to telehealth for [facility].   Date:  11/08/2019   ID:  Brandy HanksLisa A Meyer, DOB November 11, 1956, MRN [id number]  Patient Location: Home Provider Location: Home  PCP:  Juluis RainierBarnes, Elizabeth, MD  Cardiologist:  Lance MussJayadeep Varanasi, MD   Electrophysiologist:  None   Evaluation Performed:  Follow-Up Visit  Chief Complaint:  Follow up  History of Present Illness:    Brandy HanksLisa A Meyer is a 63 y.o. female with history of CAD coronary CTA 12/2018 with calcium score of 1315 and extensive plaque with nonobstructive by FFR.  Borderline significant FFR of the circumflex 0.87 mid circumflex and 0.80 distal circumflex 0.91 RCA nonsignificant in the LAD.  Aggressive medical management recommended.  Also has hyperlipidemia.  Patient last saw Dr. Eldridge DaceVaranasi 01/30/2019 and was doing well without chest pain.  Atorvastatin was increased to 80 mg and zetia added and most recent LDL was at goal.  Patient doing well without chest pain, shortness of breath, dizziness or presyncope. Walks 1 mile/day. Has a fitbit that reminds her to walk.   The patient does not have symptoms concerning for COVID-19 infection (fever, chills, cough, or new shortness of breath).    Past Medical History:  Diagnosis Date  . Anxiety   . Arthritis   . CAD in native artery    a. by coronary CT - medical therapy.  Marland Kitchen. History of  bronchitis    15 yrs ago  . Hyperlipidemia    takes Atorvastatin daily  . Hypertension    takes Metoprolol daily  . Joint pain   . Nocturia   . Osteopenia    [facility]  . PFO (patent foramen ovale)    a. small PFO by coronary CT (possible).  . Pneumonia    hx of-20 yrs ago  . Sinus tachycardia    Past Surgical History:  Procedure Laterality Date  . BACK SURGERY     x  2  . CESAREAN SECTION    . COLONOSCOPY    . LUNG SURGERY Left 2003   saw a spot on her lung , removed it, benign  . TOTAL HIP ARTHROPLASTY Left 03/30/2017   Procedure: LEFT TOTAL HIP ARTHROPLASTY ANTERIOR APPROACH;  Surgeon: Kathryne Hitchhristopher Y Blackman, MD;  Location: [facility];  Service: Orthopedics;  Laterality: Left;     Current Meds  Medication Sig  . aspirin EC 81 MG tablet Take 1 tablet (81 mg total) by mouth daily.  Marland Kitchen. ezetimibe (ZETIA) 10 MG tablet Take 1 tablet (10 mg total) by mouth daily.  . fluticasone (FLONASE) 50 MCG/ACT nasal spray Place 2 sprays into both nostrils every evening.  . metoprolol succinate (TOPROL-XL) 25 MG 24 hr tablet Take 1 tablet (25 mg total) by mouth daily.  . nitroGLYCERIN (NITROSTAT) 0.4 MG SL tablet Place 1 tablet (0.4 mg total) under the tongue every 5 (five) minutes as  needed for chest pain.  . rosuvastatin (CRESTOR) 40 MG tablet Take 1 tablet (40 mg total) by mouth daily.     Allergies:   Shellfish allergy, Effexor [venlafaxine], and Penicillins   Social History   Tobacco Use  . Smoking status: Former Games developer  . Smokeless tobacco: Never Used  . Tobacco comment: quit smoking 11 yrs ago  Substance Use Topics  . Alcohol use: Yes    Comment: social  . Drug use: No     Family Hx: The patient's family history includes Alcoholism in her father; Heart attack in her mother; Lung cancer in her father.  ROS:   Please see the history of present illness.      All other systems reviewed and are negative.   Prior CV studies:   The following studies  were reviewed today:  Coronary CT in 12/2018 showed: Coronary Arteries: Right dominant with no anomalies   LM: Calcified plaque ostial left main with mild (<50%) stenosis.   LAD system: Calcified plaque proximal LAD, suspect mild (<50%) stenosis. Moderate D1 with calcified plaque, mild (<50%) stenosis.   Circumflex system: Small ramus with mixed plaque, possible moderate stenosis proximally (51-69%). There was a moderate OM1 with no plaque or stenosis. The AV LCx beyond OM1 was very small, no plaque or stenosis.   RCA system: Mixed plaque proximal to mid RCA with mild (<50%) stenosis. Mixed plaque mid PDA with probably mild (<50%) stenosis.   IMPRESSION: 1. Coronary artery calcium score 1315 Agatston units. This places the patient in the 99th percentile for age and gender, suggesting high risk for future cardiac events.   2. Extensive plaque but probably nonobstructive disease. Most significant stenosis appears to be moderate disease in a small ramus. I will send for FFR to confirm.   FFR 0.91 RCA   FFR nonsignificant in LAD   FFR 0.87 mid LCx, 0.80 distal LCx.   IMPRESSION: Borderline significant fall in FFR in the LCx. Would recommend medical management unless concerning symptoms present.   [name]     Labs/Other Tests and Data Reviewed:    EKG:  No ECG reviewed.  Recent Labs: 12/29/2018: BUN 10; Creatinine, Ser 0.65; Potassium 4.5; Sodium 139 08/16/2019: ALT 43   Recent Lipid Panel Lab Results  Component Value Date/Time   CHOL 130 08/16/2019 08:44 AM   TRIG 192 (H) 08/16/2019 08:44 AM   HDL 42 08/16/2019 08:44 AM   CHOLHDL 3.1 08/16/2019 08:44 AM   CHOLHDL 4.3 04/15/2016 08:41 AM   LDLCALC 56 08/16/2019 08:44 AM   LDLDIRECT 127.0 09/05/2014 08:51 AM    Wt Readings from Last 3 Encounters:  11/08/19 132 lb 8 oz (60.1 kg)  01/30/19 134 lb 6.4 oz (61 kg)  10/21/18 135 lb (61.2 kg)     Objective:    Vital Signs:  Wt 132 lb 8 oz (60.1 kg)   BMI  25.04 kg/m    VITAL SIGNS:  reviewed GEN:  no acute distress RESPIRATORY:  normal respiratory effort, symmetric expansion CARDIOVASCULAR:  no peripheral edema  ASSESSMENT & PLAN:    CADcoronary CTA with calcium score of 1315 and extensive plaque with nonobstructive by FFR.  Borderline significant FFR of the circumflex 0.87 mid circumflex and 0.80 distal circumflex 0.91 RCA nonsignificant in the LAD.  Aggressive medical management recommended-doing well without angina.exercising daily. No changes  Hyperlipidemia rosuvastatin 80 mg and zetia daily LDL 32 and triglycerides 192 08/2019-will recheck in Feb. If trig still high consider vascepa  Essential HTN says  her BP is usually high at Dr.'s visit's but has no way to check it. Will send a cuff and have her call us with readings  COVID-19 Education: The signs and symptoms of COVID-19 were discussed with the patient and how to seek care for testing (follow up with PCP or arrange E-visit).   The importance of social distancing was discussed today.  Time:   Today, I have spent 10:20 minutes with the patient with telehealth technology discussing the above problems.     Medication Adjustments/Labs and Tests Ordered: Current medicines are reviewed at length with the patient today.  Concerns regarding medicines are outlined above.   Tests Ordered: No orders of the defined types were placed in this encounter.   Medication Changes: No orders of the defined types were placed in this encounter.   Follow Up:  Either In Person or Virtual in 6 month(s) Dr. Irish Lack  Signed, Ermalinda Barrios, PA-C  11/08/2019 12:46 PM    [place]

## 2019-11-08 ENCOUNTER — Telehealth (INDEPENDENT_AMBULATORY_CARE_PROVIDER_SITE_OTHER): Payer: BC Managed Care – PPO | Admitting: Physician Assistant

## 2019-11-08 ENCOUNTER — Encounter: Payer: Self-pay | Admitting: Physician Assistant

## 2019-11-08 ENCOUNTER — Other Ambulatory Visit: Payer: Self-pay

## 2019-11-08 VITALS — Wt 132.5 lb

## 2019-11-08 DIAGNOSIS — I2510 Atherosclerotic heart disease of native coronary artery without angina pectoris: Secondary | ICD-10-CM

## 2019-11-08 DIAGNOSIS — I10 Essential (primary) hypertension: Secondary | ICD-10-CM | POA: Diagnosis not present

## 2019-11-08 DIAGNOSIS — E782 Mixed hyperlipidemia: Secondary | ICD-10-CM

## 2019-11-08 MED ORDER — EZETIMIBE 10 MG PO TABS: 10.0000 mg | ORAL_TABLET | Freq: Every day | ORAL | 3 refills | Status: DC

## 2019-11-08 MED ORDER — METOPROLOL SUCCINATE ER 25 MG PO TB24: 25.0000 mg | ORAL_TABLET | Freq: Every day | ORAL | 3 refills | Status: DC

## 2019-11-08 MED ORDER — ROSUVASTATIN CALCIUM 40 MG PO TABS: 40.0000 mg | ORAL_TABLET | Freq: Every day | ORAL | 3 refills | Status: DC

## 2019-11-08 NOTE — Patient Instructions (Addendum)
Medication Instructions:  Your physician recommends that you continue on your current medications as directed. Please refer to the Current Medication list given to you today.  *If you need a refill on your cardiac medications before your next appointment, please call your pharmacy*  Lab Work: Your physician recommends that you return for a FASTING lipid profile in February/March  If you have labs (blood work) drawn today and your tests are completely normal, you will receive your results only by: Marland Kitchen MyChart Message (if you have MyChart) OR . A paper copy in the mail If you have any lab test that is abnormal or we need to change your treatment, we will call you to review the results.  Testing/Procedures: None ordered  Follow-Up: At [facility], you and your health needs are our priority.  As part of our continuing mission to provide you with exceptional heart care, we have created designated Provider Care Teams.  These Care Teams include your primary Cardiologist (physician) and Advanced Practice Providers (APPs -  Physician Assistants and Nurse Practitioners) who all work together to provide you with the care you need, when you need it.  Your next appointment:   6 month(s)  The format for your next appointment:   Either In Person or Virtual  Provider:   You may see Larae Grooms, MD or one of the following Advanced Practice Providers on your designated Care Team:    Melina Copa, PA-C  Ermalinda Barrios, PA-C   Other Instructions Your physician has requested that you regularly monitor and record your blood pressure readings at home. Please use the same machine at the same time of day to check your readings and record them. Please call or send your readings through MyChart.

## 2019-11-28 DIAGNOSIS — I10 Essential (primary) hypertension: Secondary | ICD-10-CM | POA: Diagnosis not present

## 2019-11-28 DIAGNOSIS — M545 Low back pain: Secondary | ICD-10-CM | POA: Diagnosis not present

## 2019-11-28 DIAGNOSIS — Z6825 Body mass index (BMI) 25.0-25.9, adult: Secondary | ICD-10-CM | POA: Diagnosis not present

## 2019-12-21 DIAGNOSIS — M549 Dorsalgia, unspecified: Secondary | ICD-10-CM | POA: Diagnosis not present

## 2019-12-21 DIAGNOSIS — M25512 Pain in left shoulder: Secondary | ICD-10-CM | POA: Diagnosis not present

## 2019-12-26 DIAGNOSIS — M545 Low back pain: Secondary | ICD-10-CM | POA: Diagnosis not present

## 2020-01-03 DIAGNOSIS — M19032 Primary osteoarthritis, left wrist: Secondary | ICD-10-CM | POA: Diagnosis not present

## 2020-01-03 DIAGNOSIS — M25532 Pain in left wrist: Secondary | ICD-10-CM | POA: Diagnosis not present

## 2020-01-04 DIAGNOSIS — S46912A Strain of unspecified muscle, fascia and tendon at shoulder and upper arm level, left arm, initial encounter: Secondary | ICD-10-CM | POA: Diagnosis not present

## 2020-01-04 DIAGNOSIS — M25512 Pain in left shoulder: Secondary | ICD-10-CM | POA: Diagnosis not present

## 2020-01-16 ENCOUNTER — Encounter: Payer: Self-pay | Admitting: Physical Therapy

## 2020-01-16 ENCOUNTER — Ambulatory Visit (INDEPENDENT_AMBULATORY_CARE_PROVIDER_SITE_OTHER): Payer: BC Managed Care – PPO | Admitting: Physical Therapy

## 2020-01-16 ENCOUNTER — Other Ambulatory Visit: Payer: Self-pay

## 2020-01-16 DIAGNOSIS — R29898 Other symptoms and signs involving the musculoskeletal system: Secondary | ICD-10-CM | POA: Diagnosis not present

## 2020-01-16 DIAGNOSIS — M6281 Muscle weakness (generalized): Secondary | ICD-10-CM | POA: Diagnosis not present

## 2020-01-16 DIAGNOSIS — M25612 Stiffness of left shoulder, not elsewhere classified: Secondary | ICD-10-CM

## 2020-01-16 DIAGNOSIS — M25512 Pain in left shoulder: Secondary | ICD-10-CM | POA: Diagnosis not present
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
## 2020-01-16 NOTE — Patient Instructions (Addendum)
IONTOPHORESIS PATIENT PRECAUTIONS & CONTRAINDICATIONS:  . Redness under one or both electrodes can occur.  This characterized by a uniform redness that usually disappears within 12 hours of treatment. . Small pinhead size blisters may result in response to the drug.  Contact your physician if the problem persists more than 24 hours. . On rare occasions, iontophoresis therapy can result in temporary skin reactions such as rash, inflammation, irritation or burns.  The skin reactions may be the result of individual sensitivity to the ionic solution used, the condition of the skin at the start of treatment, reaction to the materials in the electrodes, allergies or sensitivity to dexamethasone, or a poor connection between the patch and your skin.  Discontinue using iontophoresis if you have any of these reactions and report to your therapist. . Remove the Patch or electrodes if you have any undue sensation of pain or burning during the treatment and report discomfort to your therapist. . Tell your Therapist if you have had known adverse reactions to the application of electrical current. . If using the Patch, the LED light will turn off when treatment is complete and the patch can be removed.  Approximate treatment time is 1-3 hours.  Remove the patch when light goes off or after 6 hours. . The Patch can be worn during normal activity, however excessive motion where the electrodes have been placed can cause poor contact between the skin and the electrode or uneven electrical current resulting in greater risk of skin irritation. Marland Kitchen Keep out of the reach of children.   . DO NOT use if you have a cardiac pacemaker or any other electrically sensitive implanted device. . DO NOT use if you have a known sensitivity to dexamethasone. . DO NOT use during Magnetic Resonance Imaging (MRI). . DO NOT use over broken or compromised skin (e.g. sunburn, cuts, or acne) due to the increased risk of skin reaction. . DO  NOT SHAVE over the area to be treated:  To establish good contact between the Patch and the skin, excessive hair may be clipped. . DO NOT place the Patch or electrodes on or over your eyes, directly over your heart, or brain. . DO NOT reuse the Patch or electrodes as this may cause burns to occur.     Access Code: [email or web address]  URL: [email or web address]  Date: 01/16/2020  Prepared by: Raynelle Fanning Viraj Liby   Exercises Supine Shoulder Flexion Extension AAROM with Dowel - 10 reps - 1 sets - 3-5 hold - 2x daily - 7x weekly Supine Shoulder External Internal Rotation AAROM with Dowel - 10 reps - 1 sets - 3-5 hold - 2x daily - 7x weekly Standing Bilateral Shoulder Internal Rotation AAROM with Dowel - 10 reps - 1 sets - 3-5 hold - 2x daily - 7x weekly Standing Shoulder Extension with Dowel - 10 reps - 1 sets - 3-5 hold - 2x daily - 7x weekly

## 2020-01-17 DIAGNOSIS — Z1231 Encounter for screening mammogram for malignant neoplasm of breast: Secondary | ICD-10-CM | POA: Diagnosis not present

## 2020-01-18 ENCOUNTER — Encounter: Payer: BC Managed Care – PPO | Admitting: Physical Therapy

## 2020-01-23 ENCOUNTER — Ambulatory Visit (INDEPENDENT_AMBULATORY_CARE_PROVIDER_SITE_OTHER): Payer: BC Managed Care – PPO | Admitting: Physical Therapy

## 2020-01-23 ENCOUNTER — Encounter: Payer: Self-pay | Admitting: Physical Therapy

## 2020-01-23 ENCOUNTER — Other Ambulatory Visit: Payer: Self-pay

## 2020-01-23 DIAGNOSIS — M25512 Pain in left shoulder: Secondary | ICD-10-CM | POA: Diagnosis not present

## 2020-01-23 DIAGNOSIS — M6281 Muscle weakness (generalized): Secondary | ICD-10-CM

## 2020-01-23 DIAGNOSIS — R29898 Other symptoms and signs involving the musculoskeletal system: Secondary | ICD-10-CM | POA: Diagnosis not present

## 2020-01-23 DIAGNOSIS — M25612 Stiffness of left shoulder, not elsewhere classified: Secondary | ICD-10-CM

## 2020-01-23 NOTE — Therapy (Signed)
[facility] [place] [address] [place], [place], [address] Phone: [phone]   Fax:  [phone]  Physical Therapy Treatment  Patient Details  Name: Brandy Meyer MRN: [id number] Date of Birth: 10/10/1956 Referring Provider (PT): Beverely Low, MD   Encounter Date: 01/23/2020  PT End of Session - 01/23/20 0853    Visit Number  2    Number of Visits  12    Date for PT Re-Evaluation  02/13/20    PT Start Time  0851    PT Stop Time  0930    PT Time Calculation (min)  39 min    Activity Tolerance  Patient tolerated treatment well    Behavior During Therapy  [facility] for tasks assessed/performed       Past Medical History:  Diagnosis Date  . Anxiety   . Arthritis   . CAD in native artery    a. by coronary CT - medical therapy.  Marland Kitchen History of bronchitis    15 yrs ago  . Hyperlipidemia    takes Atorvastatin daily  . Hypertension    takes Metoprolol daily  . Joint pain   . Nocturia   . Osteopenia    by D[facility]A scan at [facility] on January 30, 2009  . PFO (patent foramen ovale)    a. small PFO by coronary CT (possible).  . Pneumonia    hx of-20 yrs ago  . Sinus tachycardia     Past Surgical History:  Procedure Laterality Date  . BACK SURGERY     x  2  . CESAREAN SECTION    . COLONOSCOPY    . LUNG SURGERY Left 2003   saw a spot on her lung , removed it, benign  . TOTAL HIP ARTHROPLASTY Left 03/30/2017   Procedure: LEFT TOTAL HIP ARTHROPLASTY ANTERIOR APPROACH;  Surgeon: Kathryne Hitch, MD;  Location: [facility];  Service: Orthopedics;  Laterality: Left;    There were no vitals filed for this visit.  Subjective Assessment - 01/23/20 0853    Subjective  Pt reports her Lt shoulder is better than it was last week, but it is still better.  She is not taking as much medicine now.  She is still unable to wash hair.    Pertinent History  OA in left hand, back surgeries (3), left THA, lung surgery, HTN, CAD, anxiety    Patient  Stated Goals  get rid of pain and get shoulder back to normal    Currently in Pain?  Yes    Pain Score  2     Pain Location  Shoulder    Pain Orientation  Left;Anterior;Posterior    Pain Descriptors / Indicators  Aching;Dull    Aggravating Factors   reaching above 90 deg or behind back    Pain Relieving Factors  medicine         [facility] PT Assessment - 01/23/20 0001      Assessment   Medical Diagnosis  left shoulder pain    Referring Provider (PT)  Beverely Low, MD    Onset Date/Surgical Date  11/10/19    Hand Dominance  Right    Next MD Visit  one month    Prior Therapy  no      AROM   Right/Left Shoulder  Left    Left Shoulder Extension  40 Degrees    Left Shoulder Flexion  100 Degrees   in sitting       [facility] Adult PT Treatment/Exercise -  01/23/20 0001      Self-Care   Self-Care  Other Self-Care Comments    Other Self-Care Comments   pt educated on self massage with ball to Lt pec and periscapular musculature to decrease fascial restrictions and pain.       Shoulder Exercises: Supine   External Rotation  Left;10 reps;AAROM   cane   Flexion  Both;10 reps   cane   Flexion Limitations  press up x 5, then overhead x 5    Other Supine Exercises  scap retraction x 2 reps of 5 sec (switched to seated)      Shoulder Exercises: Seated   Other Seated Exercises  scap retraction x 5 sec x 5 reps;  W's x 5 sec x 5 reps    Other Seated Exercises  shoulder rolls x 10 backward      Shoulder Exercises: Standing   Internal Rotation  Both;20 reps   cane behind back, then over buttocks   Extension  Both;15 reps;AAROM   cane     Shoulder Exercises: Stretch   Table Stretch -Flexion Limitations  pt shown exercise, but not performed in clinic    Star Gazer Stretch  10 seconds;3 reps   hands on forehead   Other Shoulder Stretches  doorway and corner stretch with arms ~80 deg x 20 sec x 2 reps (corner preferred.)      Iontophoresis   Type of Iontophoresis  Dexamethasone     Location  left supraspinatus insertion    Dose  1.0 cc     Time  120 mA; 12 hr wear time.              PT Education - 01/23/20 1217    Education Details  HEP. - updated.  Handout issued.  Pt returned demo and verbalized understanding.          PT Long Term Goals - 01/16/20 1428      PT LONG TERM GOAL #1   Title  Ind with HEP for ROM and strengthening    Time  4    Period  Weeks    Status  New    Target Date  02/13/20      PT LONG TERM GOAL #2   Title  Improved left shoulder ROM to [facility] to perform normal ADLS    Time  4    Period  Weeks    Status  New      PT LONG TERM GOAL #3   Title  Patient able to sleep without waking from pain.    Time  4    Period  Weeks    Status  New      PT LONG TERM GOAL #4   Title  Patient able to bathe and don/doff clothing without pain in left shoulder.    Time  4    Period  Weeks    Status  New      PT LONG TERM GOAL #5   Title  Patient to demo 4+/5 strength in the left shoulder to ease ADLS    Time  4    Period  Weeks    Status  New            Plan - 01/23/20 1215    Clinical Impression Statement  Positive response to AAROM exercises and ionto last visit.  Progressed AAROM in visit without increase in reported pain, but pt is guarded with motion.  Goals are ongoing.    Personal Factors  and Comorbidities  Comorbidity 3+    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 hand, back surgeries (3), left THA, lung surgery, HTN, CAD, anxiety    Examination-Activity Limitations  Bathing;Dressing;Sleep;Reach Overhead;Lift    Stability/Clinical Decision Making  Stable/Uncomplicated    Rehab Potential  Excellent    PT Frequency  3x / week    PT Duration  4 weeks    PT Treatment/Interventions  ADLs/Self Care Home Management;Cryotherapy;Electrical Stimulation;Iontophoresis 4mg /ml Dexamethasone;Moist Heat;Ultrasound;Therapeutic exercise;Neuromuscular re-education;Patient/family education;Manual techniques;Passive range of motion;Dry  needling;Taping;Joint Manipulations;Spinal Manipulations    PT Next Visit Plan  Add isometrics, measure ROM, issue info on TENS, continue AAROM.    PT Home Exercise Plan  [email or web address]    Consulted and Agree with Plan of Care  Patient       Patient will benefit from skilled therapeutic intervention in order to improve the following deficits and impairments:  Decreased range of motion, Decreased strength, Impaired flexibility, Impaired UE functional use, Pain  Visit Diagnosis: Acute pain of left shoulder  Stiffness of left shoulder, not elsewhere classified  Muscle weakness (generalized)  Other symptoms and signs involving the musculoskeletal system     Problem List Patient Active Problem List   Diagnosis Date Noted  . Unilateral primary osteoarthritis, left hip 03/30/2017  . Status post total replacement of left hip 03/30/2017  . S/P lumbar spinal fusion 10/11/2015  . Sinus tachycardia 09/04/2014  . Hyperlipidemia    Kerin Perna, PTA 01/23/20 12:26 PM  [place], [place], [address] Phone: [phone]   Fax:  [phone]  Name: Brandy Meyer MRN: [id number] Date of Birth: March 08, 1956

## 2020-01-23 NOTE — Patient Instructions (Signed)
Access Code: [email or web address]  URL: [email or web address]  Date: 01/23/2020  Prepared by: Mayer Camel   Exercises  Supine Shoulder Flexion Extension AAROM with Dowel - 10 reps - 1 sets - 3-5 hold - 2x daily - 7x weekly  Standing Bilateral Shoulder Internal Rotation AAROM with Dowel - 10 reps - 1 sets - 3-5 hold - 2x daily - 7x weekly  Standing Shoulder Extension with Dowel - 10 reps - 1 sets - 3-5 hold - 2x daily - 7x weekly  Supine Chest Stretch with Elbows Bent - 2 reps - 1 sets - 15 seconds hold - 2x daily - 7x weekly  Seated Scapular Retraction - 5 reps - 1 sets - 5 hold - 3x daily - 7x weekly  W's - this is a stick up position - 5 reps - 1 sets - 5 hold - 1x daily - 7x weekly  Doorway Pec Stretch at 90 Degrees Abduction - 3 reps - 1 sets - 30-60seconds hold - 2x daily - 7x weekly  Seated Bilateral Shoulder Flexion Towel Slide at Table Top - 5 reps - 3 sets - 10 hold - 1x daily - 7x weekly

## 2020-01-25 DIAGNOSIS — M545 Low back pain: Secondary | ICD-10-CM | POA: Diagnosis not present

## 2020-01-26 ENCOUNTER — Ambulatory Visit (INDEPENDENT_AMBULATORY_CARE_PROVIDER_SITE_OTHER): Payer: BC Managed Care – PPO | Admitting: Physical Therapy

## 2020-01-26 ENCOUNTER — Other Ambulatory Visit: Payer: Self-pay

## 2020-01-26 DIAGNOSIS — R29898 Other symptoms and signs involving the musculoskeletal system: Secondary | ICD-10-CM | POA: Diagnosis not present

## 2020-01-26 DIAGNOSIS — M25612 Stiffness of left shoulder, not elsewhere classified: Secondary | ICD-10-CM | POA: Diagnosis not present

## 2020-01-26 DIAGNOSIS — M6281 Muscle weakness (generalized): Secondary | ICD-10-CM

## 2020-01-26 DIAGNOSIS — M25512 Pain in left shoulder: Secondary | ICD-10-CM | POA: Diagnosis not present

## 2020-01-26 NOTE — Therapy (Signed)
[facility] [place] [address] [place], [place], [address] Phone: [phone]   Fax:  [phone]  Physical Therapy Treatment  Patient Details  Name: Brandy Meyer MRN: [id number] Date of Birth: 07/08/1956 Referring Provider (PT): Beverely Low, MD   Encounter Date: 01/26/2020  PT End of Session - 01/26/20 0807    Visit Number  3    Number of Visits  12    Date for PT Re-Evaluation  02/13/20    PT Start Time  0802    PT Stop Time  0845    PT Time Calculation (min)  43 min    Activity Tolerance  Patient tolerated treatment well    Behavior During Therapy  [facility] for tasks assessed/performed       Past Medical History:  Diagnosis Date  . Anxiety   . Arthritis   . CAD in native artery    a. by coronary CT - medical therapy.  Marland Kitchen History of bronchitis    15 yrs ago  . Hyperlipidemia    takes Atorvastatin daily  . Hypertension    takes Metoprolol daily  . Joint pain   . Nocturia   . Osteopenia    by DEXA scan at [facility] on January 30, 2009  . PFO (patent foramen ovale)    a. small PFO by coronary CT (possible).  . Pneumonia    hx of-20 yrs ago  . Sinus tachycardia     Past Surgical History:  Procedure Laterality Date  . BACK SURGERY     x  2  . CESAREAN SECTION    . COLONOSCOPY    . LUNG SURGERY Left 2003   saw a spot on her lung , removed it, benign  . TOTAL HIP ARTHROPLASTY Left 03/30/2017   Procedure: LEFT TOTAL HIP ARTHROPLASTY ANTERIOR APPROACH;  Surgeon: Kathryne Hitch, MD;  Location: [facility];  Service: Orthopedics;  Laterality: Left;    There were no vitals filed for this visit.  Subjective Assessment - 01/26/20 0807    Subjective  Pt reports she has been more diligent with her exercises, "pushing herself" so that it will get better. She did not get a chance to try ball massage (no ball at home).   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    Pertinent History  OA in left hand,  back surgeries (3), left THA, lung surgery, HTN, CAD, anxiety    Patient Stated Goals  get rid of pain and get shoulder back to normal    Currently in Pain?  Yes    Pain Score  2     Pain Location  Shoulder    Pain Orientation  Left;Anterior    Pain Descriptors / Indicators  Sore         [facility] PT Assessment - 01/26/20 0001      Assessment   Medical Diagnosis  left shoulder pain    Referring Provider (PT)  Beverely Low, MD    Onset Date/Surgical Date  11/10/19    Hand Dominance  Right    Next MD Visit  3/21    Prior Therapy  no      PROM   Left Shoulder Flexion  147 Degrees   supine, AAROM   Left Shoulder External Rotation  76 Degrees   supine, AAROM -elbow not supported      [facility] Adult PT Treatment/Exercise - 01/26/20 0001      Shoulder Exercises: Supine   Horizontal ABduction  Both;AROM;5 reps    Flexion  AAROM;Both;5 reps   cane, 10 sec hold   ABduction  AAROM;Left;5 reps   cane     Shoulder Exercises: Seated   Other Seated Exercises  scap retraction x 5 sec x 5 reps;  W's x 5 sec x 5 reps    Other Seated Exercises  shoulder rolls x 10 backward      Shoulder Exercises: Pulleys   Flexion  --    Scaption  --      Shoulder Exercises: Stretch   Corner Stretch  2 reps;20 seconds    Wall Stretch - Flexion  2 reps;10 seconds    Star Gazer Stretch  2 reps;20 seconds   pillow supporting Lt elbow   Other Shoulder Stretches  wall ladder LUE flexion x 5 reps, abdct x 5 reps.       Iontophoresis   Type of Iontophoresis  Dexamethasone    Location  left supraspinatus insertion    Dose  1.0 cc     Time  120 mA; 12 hr wear time.       Manual Therapy   Manual Therapy  Soft tissue mobilization    Soft tissue mobilization  IASTM to Lt ant / post shoulder to decrease fascial restrictions and improve ROM.                   PT Long Term Goals - 01/16/20 1428      PT LONG TERM GOAL #1   Title  Ind with HEP for ROM and strengthening    Time  4    Period   Weeks    Status  New    Target Date  02/13/20      PT LONG TERM GOAL #2   Title  Improved left shoulder ROM to [facility] to perform normal ADLS    Time  4    Period  Weeks    Status  New      PT LONG TERM GOAL #3   Title  Patient able to sleep without waking from pain.    Time  4    Period  Weeks    Status  New      PT LONG TERM GOAL #4   Title  Patient able to bathe and don/doff clothing without pain in left shoulder.    Time  4    Period  Weeks    Status  New      PT LONG TERM GOAL #5   Title  Patient to demo 4+/5 strength in the left shoulder to ease ADLS    Time  4    Period  Weeks    Status  New            Plan - 01/26/20 1641    Clinical Impression Statement  Gradual improvement in Lt shoulder ROM.  Pt tolerated new exercises well, with minimal increase in soreness. Progressing towards goals.    Personal Factors and Comorbidities  Comorbidity 3+    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 hand, back surgeries (3), left THA, lung surgery, HTN, CAD, anxiety    Examination-Activity Limitations  Bathing;Dressing;Sleep;Reach Overhead;Lift    Stability/Clinical Decision Making  Stable/Uncomplicated    Rehab Potential  Excellent    PT Frequency  3x / week    PT Duration  4 weeks    PT Treatment/Interventions  ADLs/Self Care Home Management;Cryotherapy;Electrical Stimulation;Iontophoresis 4mg /ml Dexamethasone;Moist Heat;Ultrasound;Therapeutic exercise;Neuromuscular re-education;Patient/family education;Manual techniques;Passive range of motion;Dry needling;Taping;Joint Manipulations;Spinal Manipulations  PT Next Visit Plan  continue AAROM, add isometrics to HEP.    PT Home Exercise Plan  [email or web address]    Consulted and Agree with Plan of Care  Patient       Patient will benefit from skilled therapeutic intervention in order to improve the following deficits and impairments:  Decreased range of motion, Decreased strength, Impaired flexibility, Impaired UE functional use, Pain  Visit  Diagnosis: Acute pain of left shoulder  Stiffness of left shoulder, not elsewhere classified  Muscle weakness (generalized)  Other symptoms and signs involving the musculoskeletal system     Problem List Patient Active Problem List   Diagnosis Date Noted  . Unilateral primary osteoarthritis, left hip 03/30/2017  . Status post total replacement of left hip 03/30/2017  . S/P lumbar spinal fusion 10/11/2015  . Sinus tachycardia 09/04/2014  . Hyperlipidemia    Mayer Camel, PTA 01/26/20 4:47 PM  [facility] [place] [address] [place], [place], [address] Phone: [phone]   Fax:  [phone]  Name: Brandy Meyer MRN: [id number] Date of Birth: 1956/09/21

## 2020-01-29 ENCOUNTER — Encounter: Payer: BC Managed Care – PPO | Admitting: Physical Therapy

## 2020-02-01 ENCOUNTER — Ambulatory Visit: Payer: BC Managed Care – PPO | Admitting: Physical Therapy

## 2020-02-01 ENCOUNTER — Other Ambulatory Visit: Payer: Self-pay

## 2020-02-01 ENCOUNTER — Encounter: Payer: Self-pay | Admitting: Rehabilitative and Restorative Service Providers"

## 2020-02-01 DIAGNOSIS — M25612 Stiffness of left shoulder, not elsewhere classified: Secondary | ICD-10-CM

## 2020-02-01 DIAGNOSIS — M6281 Muscle weakness (generalized): Secondary | ICD-10-CM | POA: Diagnosis not present

## 2020-02-01 DIAGNOSIS — R29898 Other symptoms and signs involving the musculoskeletal system: Secondary | ICD-10-CM

## 2020-02-01 DIAGNOSIS — M25512 Pain in left shoulder: Secondary | ICD-10-CM | POA: Diagnosis not present

## 2020-02-01 NOTE — Therapy (Signed)
[place] [address], [place], [address] Phone: [phone]   Fax:  [phone]  Physical Therapy Treatment  Patient Details  Name: Brandy Meyer MRN: [id number] Date of Birth: 1956-08-30 Referring Provider (PT): Netta Cedars, MD   Encounter Date: 02/01/2020  PT End of Session - 02/01/20 1401    Visit Number  4    Number of Visits  12    Date for PT Re-Evaluation  02/13/20    PT Start Time  1401    PT Stop Time  1446    PT Time Calculation (min)  45 min    Activity Tolerance  Patient tolerated treatment well    Behavior During Therapy  [facility] for tasks assessed/performed       Past Medical History:  Diagnosis Date  . Anxiety   . Arthritis   . CAD in native artery    a. by coronary CT - medical therapy.  Marland Kitchen History of bronchitis    15 yrs ago  . Hyperlipidemia    takes Atorvastatin daily  . Hypertension    takes Metoprolol daily  . Joint pain   . Nocturia   . Osteopenia    by D[facility]A scan at [facility] on January 30, 2009  . PFO (patent foramen ovale)    a. small PFO by coronary CT (possible).  . Pneumonia    hx of-20 yrs ago  . Sinus tachycardia     Past Surgical History:  Procedure Laterality Date  . BACK SURGERY     x  2  . CESAREAN SECTION    . COLONOSCOPY    . LUNG SURGERY Left 2003   saw a spot on her lung , removed it, benign  . TOTAL HIP ARTHROPLASTY Left 03/30/2017   Procedure: LEFT TOTAL HIP ARTHROPLASTY ANTERIOR APPROACH;  Surgeon: Mcarthur Rossetti, MD;  Location: [place];  Service: Orthopedics;  Laterality: Left;    There were no vitals filed for this visit.  Subjective Assessment - 02/01/20 1407    Subjective  Pt reports she continues to push herself with moving arm and her exercises.  She still has difficulty sleeping and getting comfortable.    Pertinent History  OA in left hand, back surgeries (3), left THA, lung surgery, HTN, CAD, anxiety    Patient Stated Goals  get rid of pain  and get shoulder back to normal    Currently in Pain?  Yes    Pain Score  2     Pain Location  Shoulder    Pain Orientation  Left    Pain Descriptors / Indicators  Sore    Aggravating Factors   reaching behind back,   laying on Lt side.    Pain Relieving Factors  medicine         [facility] PT Assessment - 02/01/20 0001      Assessment   Medical Diagnosis  left shoulder pain    Referring Provider (PT)  Netta Cedars, MD    Onset Date/Surgical Date  11/10/19    Hand Dominance  Right    Next MD Visit  3/21    Prior Therapy  no      AROM   Right/Left Shoulder  Left    Left Shoulder Extension  57 Degrees    Left Shoulder Flexion  142 Degrees    Left Shoulder ABduction  142 Degrees      Strength   Left Shoulder Flexion  4+/5    Left  Shoulder Extension  --   5-/5   Left Shoulder ABduction  4+/5    Left Shoulder Internal Rotation  4+/5    Left Shoulder External Rotation  4-/5        [facility] Adult PT Treatment/Exercise - 02/01/20 0001      Shoulder Exercises: Supine   Horizontal ABduction  AROM;Strengthening;Both;5 reps    Theraband Level (Shoulder Horizontal ABduction)  Level 1 (Yellow)    External Rotation  Strengthening;Both;5 reps    Theraband Level (Shoulder External Rotation)  Level 1 (Yellow)    Flexion  AAROM;Both;5 reps   cane, 5 sec hold   Theraband Level (Shoulder Flexion)  Level 1 (Yellow)   5 reps of overhead pull holding band   ABduction  AAROM;Left;5 reps   cane   Other Supine Exercises  scap retraction x 5 sec x 5 reps       Shoulder Exercises: Standing   External Rotation  Both;5 reps;Strengthening    Theraband Level (Shoulder External Rotation)  Level 1 (Yellow)   moved to supine with improved tolerance.    Internal Rotation  Both;AAROM;10 reps   cane behind back, over buttocks.    Flexion  AROM;Left;5 reps    ABduction  AROM;Both;5 reps    Extension  Both;AAROM;10 reps   cane   Row  Both;10 reps;Strengthening    Theraband Level (Shoulder Row)  Level  1 (Yellow)      Shoulder Exercises: ROM/Strengthening   UBE (Upper Arm Bike)  L1: 1 min forward, 30 sec backward.       Shoulder Exercises: Stretch   Corner Stretch  2 reps;20 seconds    Star Gazer Stretch  20 seconds;3 reps      Iontophoresis   Type of Iontophoresis  Dexamethasone    Location  left supraspinatus insertion    Dose  1.0 cc     Time  80 mA stat patch, 4 hr wear time.       Manual Therapy   Soft tissue mobilization  IASTM to Lt ant / post shoulder to decrease fascial restrictions and improve ROM.                   PT Long Term Goals - 02/01/20 1642      PT LONG TERM GOAL #1   Title  Ind with HEP for ROM and strengthening    Time  4    Period  Weeks    Status  On-going      PT LONG TERM GOAL #2   Title  Improved left shoulder ROM to [facility] to perform normal ADLS    Time  4    Period  Weeks    Status  On-going      PT LONG TERM GOAL #3   Title  Patient able to sleep without waking from pain.    Time  4    Period  Weeks    Status  On-going      PT LONG TERM GOAL #4   Title  Patient able to bathe and don/doff clothing without pain in left shoulder.    Time  4    Period  Weeks    Status  Partially Met      PT LONG TERM GOAL #5   Title  Patient to demo 4+/5 strength in the left shoulder to ease ADLS    Time  4    Period  Weeks    Status  Partially Met  Plan - 02/01/20 1639    Clinical Impression Statement  Significant improvement in AROM of Lt shoulder.  Pt tolerated new light resistance exercises well, without increase in pain.  Progressing well towards goals- has partially met LTG4 and 5.    Personal Factors and Comorbidities  Comorbidity 3+    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 hand, back surgeries (3), left THA, lung surgery, HTN, CAD, anxiety    Examination-Activity Limitations  Bathing;Dressing;Sleep;Reach Overhead;Lift    Stability/Clinical Decision Making  Stable/Uncomplicated    Rehab Potential  Excellent    PT Frequency  3x  / week    PT Duration  4 weeks    PT Treatment/Interventions  ADLs/Self Care Home Management;Cryotherapy;Electrical Stimulation;Iontophoresis 63m/ml Dexamethasone;Moist Heat;Ultrasound;Therapeutic exercise;Neuromuscular re-education;Patient/family education;Manual techniques;Passive range of motion;Dry needling;Taping;Joint Manipulations;Spinal Manipulations    PT Next Visit Plan  assess response to new exercises. Progress HEP as tolerated.    PT Home Exercise Plan  [email or web address]    Consulted and Agree with Plan of Care  Patient       Patient will benefit from skilled therapeutic intervention in order to improve the following deficits and impairments:  Decreased range of motion, Decreased strength, Impaired flexibility, Impaired UE functional use, Pain  Visit Diagnosis: Acute pain of left shoulder  Stiffness of left shoulder, not elsewhere classified  Muscle weakness (generalized)  Other symptoms and signs involving the musculoskeletal system     Problem List Patient Active Problem List   Diagnosis Date Noted  . Unilateral primary osteoarthritis, left hip 03/30/2017  . Status post total replacement of left hip 03/30/2017  . S/P lumbar spinal fusion 10/11/2015  . Sinus tachycardia 09/04/2014  . Hyperlipidemia    JKerin Perna PTA 02/01/20 4:44 PM  [address] [address]: [phone]  Fax:  [phone] Name: LBREZLYN MANRIQUEMRN: [id number] of Birth: [birth year]

## 2020-02-01 NOTE — Patient Instructions (Signed)
Access Code: [email or web address]  URL: [email or web address]  Date: 02/01/2020  Prepared by: Mayer Camel   Exercises  Supine Shoulder Flexion Extension AAROM with Dowel - 10 reps - 1 sets - 3-5 hold - 2x daily - 7x weekly  Standing Bilateral Shoulder Internal Rotation AAROM with Dowel - 10 reps - 1 sets - 3-5 hold - 2x daily - 7x weekly  Standing Shoulder Extension with Dowel - 10 reps - 1 sets - 3-5 hold - 2x daily - 7x weekly  Supine Chest Stretch with Elbows Bent - 2 reps - 1 sets - 15 seconds hold - 2x daily - 7x weekly  Seated Scapular Retraction - 5 reps - 1 sets - 5 hold - 3x daily - 7x weekly  W's - this is a stick up position - 5 reps - 1 sets - 5 hold - 1x daily - 7x weekly  Doorway Pec Stretch at 90 Degrees Abduction - 3 reps - 1 sets - 30-60seconds hold - 2x daily - 7x weekly  Seated Bilateral Shoulder Flexion Towel Slide at Table Top - 5 reps - 3 sets - 10 hold - 1x daily - 7x weekly  Supine Shoulder Horizontal Abduction with Resistance - 10 reps - 1 sets - 1x daily - 7x weekly  Standing Row with Resistance - 10 reps - 1 sets - 1x daily - 7x weekly  Supine Bilateral Shoulder External Rotation with Resistance - 10 reps - 1 sets - 1x daily - 7x weekly

## 2020-02-03 DIAGNOSIS — Z03818 Encounter for observation for suspected exposure to other biological agents ruled out: Secondary | ICD-10-CM | POA: Diagnosis not present

## 2020-02-03 DIAGNOSIS — Z20828 Contact with and (suspected) exposure to other viral communicable diseases: Secondary | ICD-10-CM | POA: Diagnosis not present

## 2020-02-06 ENCOUNTER — Ambulatory Visit: Payer: BC Managed Care – PPO | Admitting: Physical Therapy

## 2020-02-06 ENCOUNTER — Encounter: Payer: Self-pay | Admitting: Physical Therapy

## 2020-02-06 ENCOUNTER — Other Ambulatory Visit: Payer: Self-pay

## 2020-02-06 DIAGNOSIS — R29898 Other symptoms and signs involving the musculoskeletal system: Secondary | ICD-10-CM

## 2020-02-06 DIAGNOSIS — M25512 Pain in left shoulder: Secondary | ICD-10-CM

## 2020-02-06 DIAGNOSIS — M25612 Stiffness of left shoulder, not elsewhere classified: Secondary | ICD-10-CM

## 2020-02-06 DIAGNOSIS — M6281 Muscle weakness (generalized): Secondary | ICD-10-CM | POA: Diagnosis not present

## 2020-02-06 NOTE — Therapy (Signed)
[place], [place], [address] Phone: [phone]   Fax:  [phone]  Physical Therapy Treatment  Patient Details  Name: Brandy Meyer MRN: [id number] Date of Birth: 03-04-1956 Referring Provider (PT): Netta Cedars, MD   Encounter Date: 02/06/2020  PT End of Session - 02/06/20 0859    Visit Number  5    Number of Visits  12    Date for PT Re-Evaluation  02/13/20    PT Start Time  0848    PT Stop Time  0929    PT Time Calculation (min)  41 min    Activity Tolerance  Patient tolerated treatment well    Behavior During Therapy  [facility] for tasks assessed/performed       Past Medical History:  Diagnosis Date  . Anxiety   . Arthritis   . CAD in native artery    a. by coronary CT - medical therapy.  Marland Kitchen History of bronchitis    15 yrs ago  . Hyperlipidemia    takes Atorvastatin daily  . Hypertension    takes Metoprolol daily  . Joint pain   . Nocturia   . Osteopenia    by DEXA scan at [facility] on January 30, 2009  . PFO (patent foramen ovale)    a. small PFO by coronary CT (possible).  . Pneumonia    hx of-20 yrs ago  . Sinus tachycardia     Past Surgical History:  Procedure Laterality Date  . BACK SURGERY     x  2  . CESAREAN SECTION    . COLONOSCOPY    . LUNG SURGERY Left 2003   saw a spot on her lung , removed it, benign  . TOTAL HIP ARTHROPLASTY Left 03/30/2017   Procedure: LEFT TOTAL HIP ARTHROPLASTY ANTERIOR APPROACH;  Surgeon: Mcarthur Rossetti, MD;  Location: [place];  Service: Orthopedics;  Laterality: Left;    There were no vitals filed for this visit.  Subjective Assessment - 02/06/20 0852    Subjective  Pt reports she thinks she slept wrong last night, so shoulder is a little sore.  Overall, she is pleased with progress.  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  She complains she can not reach behind her (ie: reaching in back seat of car)    Pertinent History  OA in  left hand, back surgeries (3), left THA, lung surgery, HTN, CAD, anxiety    Patient Stated Goals  get rid of pain and get shoulder back to normal    Currently in Pain?  Yes    Pain Score  2     Pain Location  Shoulder    Pain Orientation  Left;Anterior    Pain Descriptors / Indicators  Sore    Aggravating Factors   see above    Pain Relieving Factors  muscle roller analgesic         [facility] PT Assessment - 02/06/20 0001      Assessment   Medical Diagnosis  left shoulder pain    Referring Provider (PT)  Netta Cedars, MD    Onset Date/Surgical Date  11/10/19    Hand Dominance  Right    Next MD Visit  to be scheduled.     Prior Therapy  no        [facility] Adult PT Treatment/Exercise - 02/06/20 0001      Exercises   Exercises  Shoulder      Shoulder Exercises:  Seated   Flexion  Left;10 reps;Strengthening    Flexion Weight (lbs)  1    Flexion Limitations  1 set without wt to 90 deg, 2nd set with wt.     Abduction  Strengthening;Left;10 reps    ABduction Weight (lbs)  1    ABduction Limitations  1 set without wt to 90 deg, 2nd set with wt.      Shoulder Exercises: Sidelying   Other Sidelying Exercises  open book x 10 with Lt thoracic rotation, 5 Rt rotation       Shoulder Exercises: Standing   External Rotation  Both;10 reps;Strengthening    Theraband Level (Shoulder External Rotation)  Level 1 (Yellow)    Extension  Strengthening;Both;10 reps    Theraband Level (Shoulder Extension)  Level 1 (Yellow)    Row  Both;10 reps;Strengthening    Theraband Level (Shoulder Row)  Level 1 (Yellow)      Shoulder Exercises: ROM/Strengthening   UBE (Upper Arm Bike)  L1: 1 min forward, 1 min backward.       Shoulder Exercises: Stretch   Corner Stretch  20 seconds;3 reps   arms at 90 deg   Other Shoulder Stretches  high level door way stretch (unilateral) x 3 reps (various positions until right stretch felt)     Other Shoulder Stretches  sleeper stretch LUE x 3 reps IR, 3 reps ER.        Iontophoresis   Type of Iontophoresis  Dexamethasone    Location  left bicep tendon     Dose  1.0 cc     Time  80 mA stat patch, 4 hr wear time.                   PT Long Term Goals - 02/06/20 0902      PT LONG TERM GOAL #1   Title  Ind with HEP for ROM and strengthening    Time  4    Period  Weeks    Status  On-going      PT LONG TERM GOAL #2   Title  Improved left shoulder ROM to [place] to perform normal ADLS    Time  4    Period  Weeks    Status  Partially Met      PT LONG TERM GOAL #3   Title  Patient able to sleep without waking from pain.    Time  4    Period  Weeks    Status  Partially Met      PT LONG TERM GOAL #4   Title  Patient able to bathe and don/doff clothing without pain in left shoulder.    Baseline  able to bathe without pain, has pain with donning/doffing shirt    Time  4    Period  Weeks    Status  Partially Met      PT LONG TERM GOAL #5   Title  Patient to demo 4+/5 strength in the left shoulder to ease ADLS    Time  4    Period  Weeks    Status  Partially Met            Plan - 02/06/20 1328    Clinical Impression Statement  Pt reporting much improved use of LUE with ADLs.  She continues to have difficult time laying on Lt shoulder for sleeping and with sleeper stretch.  High level doorway stretch is also difficult but tolerable.  Progressing well towards remaining goals.  Personal Factors and Comorbidities  Comorbidity 3+    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 hand, back surgeries (3), left THA, lung surgery, HTN, CAD, anxiety    Examination-Activity Limitations  Bathing;Dressing;Sleep;Reach Overhead;Lift    Stability/Clinical Decision Making  Stable/Uncomplicated    Rehab Potential  Excellent    PT Frequency  3x / week    PT Duration  4 weeks    PT Treatment/Interventions  ADLs/Self Care Home Management;Cryotherapy;Electrical Stimulation;Iontophoresis 56m/ml Dexamethasone;Moist Heat;Ultrasound;Therapeutic exercise;Neuromuscular  re-education;Patient/family education;Manual techniques;Passive range of motion;Dry needling;Taping;Joint Manipulations;Spinal Manipulations    PT Next Visit Plan  assess response to new exercises. Progress HEP as tolerated.    PT Home Exercise Plan  [email or web address]    Consulted and Agree with Plan of Care  Patient       Patient will benefit from skilled therapeutic intervention in order to improve the following deficits and impairments:  Decreased range of motion, Decreased strength, Impaired flexibility, Impaired UE functional use, Pain  Visit Diagnosis: Acute pain of left shoulder  Stiffness of left shoulder, not elsewhere classified  Muscle weakness (generalized)  Other symptoms and signs involving the musculoskeletal system     Problem List Patient Active Problem List   Diagnosis Date Noted  . Unilateral primary osteoarthritis, left hip 03/30/2017  . Status post total replacement of left hip 03/30/2017  . S/P lumbar spinal fusion 10/11/2015  . Sinus tachycardia 09/04/2014  . Hyperlipidemia    JKerin Perna PTA 02/06/20 1:30 PM  [address] [address]Phone: [phone]  Fax:  [phone] Name: Brandy SARTIMRN: [id number] of Birth: 404/25/1957

## 2020-02-07 ENCOUNTER — Other Ambulatory Visit: Payer: BC Managed Care – PPO

## 2020-02-09 ENCOUNTER — Other Ambulatory Visit: Payer: Self-pay

## 2020-02-09 ENCOUNTER — Ambulatory Visit (INDEPENDENT_AMBULATORY_CARE_PROVIDER_SITE_OTHER): Payer: BC Managed Care – PPO | Admitting: Physical Therapy

## 2020-02-09 ENCOUNTER — Encounter: Payer: Self-pay | Admitting: Physical Therapy

## 2020-02-09 DIAGNOSIS — M25612 Stiffness of left shoulder, not elsewhere classified: Secondary | ICD-10-CM | POA: Diagnosis not present

## 2020-02-09 DIAGNOSIS — M25512 Pain in left shoulder: Secondary | ICD-10-CM | POA: Diagnosis not present

## 2020-02-09 DIAGNOSIS — M6281 Muscle weakness (generalized): Secondary | ICD-10-CM | POA: Diagnosis not present

## 2020-02-09 NOTE — Patient Instructions (Signed)
Access Code: [email or web address]  URL: [email or web address]  Date: 02/09/2020  Prepared by: Mayer Camel   Exercises  Seated Scapular Retraction - 5 reps - 1 sets - 5 hold - 3x daily - 7x weekly  Standing Row with Resistance - 10 reps - 2 sets - 1x daily - 7x weekly  Supine Shoulder Horizontal Abduction with Resistance - 10 reps - 2 sets - 1x daily - 7x weekly  Supine Bilateral Shoulder External Rotation with Resistance - 10 reps - 2 sets - 1x daily - 7x weekly  Supine Chest Stretch with Elbows Bent - 2 reps - 1 sets - 15 seconds hold - 2x daily - 7x weekly  Sleeper Stretch - 3 reps - 1 sets - 30 hold - 1x daily - 7x weekly  Sidelying Thoracic Rotation with Open Book - 5-10 reps - 1 sets - 1x daily - 7x weekly  Standing Shoulder Internal Rotation Stretch with Hands Behind Back - 5 reps - 1 sets - 5-10 hold - 1x daily - 7x weekly  Doorway Pec Stretch at 90 Degrees Abduction - 3 reps - 1 sets - 20 seconds hold - 2x daily - 7x weekly  Single Arm Doorway Pec Stretch at 120 Degrees Abduction - 2 reps - 1 sets - 20 hold - 2x daily - 7x weekly

## 2020-02-09 NOTE — Therapy (Signed)
[facility] [address] [place], [place], [address] Phone: [phone]   Fax:  [phone]  Physical Therapy Treatment  Patient Details  Name: Brandy Meyer MRN: [id number] Date of Birth: [birth year] Referring Provider (PT): Netta Cedars, MD   Encounter Date: 02/09/2020  PT End of Session - 02/09/20 0934    Visit Number  6    Number of Visits  12    Date for PT Re-Evaluation  02/13/20    PT Start Time  0848    PT Stop Time  0929    PT Time Calculation (min)  41 min    Activity Tolerance  Patient tolerated treatment well;No increased pain    Behavior During Therapy  WFL for tasks assessed/performed       Past Medical History:  Diagnosis Date  . Anxiety   . Arthritis   . CAD in native artery    a. by coronary CT - medical therapy.  Marland Kitchen History of bronchitis    15 yrs ago  . Hyperlipidemia    takes Atorvastatin daily  . Hypertension    takes Metoprolol daily  . Joint pain   . Nocturia   . Osteopenia    by DEXA scan at [facility] on January 30, 2009  . PFO (patent foramen ovale)    a. small PFO by coronary CT (possible).  . Pneumonia    hx of-20 yrs ago  . Sinus tachycardia     Past Surgical History:  Procedure Laterality Date  . BACK SURGERY     x  2  . CESAREAN SECTION    . COLONOSCOPY    . LUNG SURGERY Left 2003   saw a spot on her lung , removed it, benign  . TOTAL HIP ARTHROPLASTY Left 03/30/2017   Procedure: LEFT TOTAL HIP ARTHROPLASTY ANTERIOR APPROACH;  Surgeon: Mcarthur Rossetti, MD;  Location: [place];  Service: Orthopedics;  Laterality: Left;    There were no vitals filed for this visit.  Subjective Assessment - 02/09/20 0850    Subjective  Pt reports the Open book exercise has been helpful.  She was able to hook sports bra in back for first time.    Pertinent History  OA in left hand, back surgeries (3), left THA, lung surgery, HTN, CAD, anxiety    Patient Stated Goals  get rid of pain and get  shoulder back to normal    Currently in Pain?  Yes    Pain Score  1     Pain Location  Shoulder    Pain Orientation  Left;Anterior    Pain Descriptors / Indicators  Aching    Aggravating Factors   reaching behind her    Pain Relieving Factors  muscle roller analgesic         [facility] PT Assessment - 02/09/20 0001      Assessment   Medical Diagnosis  left shoulder pain    Referring Provider (PT)  Netta Cedars, MD    Onset Date/Surgical Date  11/10/19    Hand Dominance  Right    Next MD Visit  to be scheduled.     Prior Therapy  no      AROM   Right/Left Shoulder  Left    Left Shoulder Extension  58 Degrees    Left Shoulder Flexion  143 Degrees    Left Shoulder ABduction  147 Degrees    Left Shoulder Internal Rotation  47 Degrees    Left Shoulder External  Rotation  80 Degrees      Strength   Left Shoulder Flexion  5/5    Left Shoulder Extension  5/5    Left Shoulder ABduction  5/5    Left Shoulder Internal Rotation  4+/5    Left Shoulder External Rotation  4/5      [facility] Adult PT Treatment/Exercise - 02/09/20 0001      Shoulder Exercises: Supine   Horizontal ABduction  Both;10 reps;Strengthening    Theraband Level (Shoulder Horizontal ABduction)  Level 2 (Red)   2 sets   External Rotation  Both;10 reps;Strengthening    Theraband Level (Shoulder External Rotation)  Level 2 (Red)   2 sets   Flexion  --   verbally reviewed   Other Supine Exercises  verbally reviewed current HEP exercises not covered during session.       Shoulder Exercises: Sidelying   Other Sidelying Exercises  open book x 10 with Lt thoracic rotation      Shoulder Exercises: ROM/Strengthening   UBE (Upper Arm Bike)  L2:  1 min forward, 1 min backward, standing.       Shoulder Exercises: Stretch   Corner Stretch  20 seconds;3 reps   arms at 90 deg   Wall Stretch - Flexion  1 rep;10 seconds    Star Gazer Stretch  2 reps;20 seconds    Other Shoulder Stretches  high level door way stretch  (unilateral) x 2 reps,     Other Shoulder Stretches  sleeper stretch LUE x 3 reps IR      Manual Therapy   Soft tissue mobilization  IASTM to Lt post shoulder to decrease fascial restrictions and improve ROM.              PT Education - 02/09/20 0933    Education Details  HEP updated.    Person(s) Educated  Patient    Methods  Explanation;Demonstration;Verbal cues;Handout    Comprehension  Verbalized understanding;Returned demonstration          PT Long Term Goals - 02/09/20 0908      PT LONG TERM GOAL #1   Title  Ind with HEP for ROM and strengthening    Time  4    Period  Weeks    Status  On-going      PT LONG TERM GOAL #2   Title  Improved left shoulder ROM to [facility] to perform normal ADLS    Time  4    Period  Weeks    Status  Partially Met      PT LONG TERM GOAL #3   Title  Patient able to sleep without waking from pain.    Time  4    Period  Weeks    Status  Achieved      PT LONG TERM GOAL #4   Title  Patient able to bathe and don/doff clothing without pain in left shoulder.    Baseline  able to bathe without pain, has pain with donning/doffing shirt    Time  4    Period  Weeks    Status  Partially Met      PT LONG TERM GOAL #5   Title  Patient to demo 4+/5 strength in the left shoulder to ease ADLS    Time  4    Period  Weeks    Status  Partially Met            Plan - 02/09/20 0911    Clinical Impression Statement  Pt demonstrating improved Lt shoulder strength and ROM.  She no longer wakes due to shoulder pain.  Pt reported improved tolerance for shoulder stretches today.  Progressing well towards remaining goals. Anticipate d/c in the next few visits.    Personal Factors and Comorbidities  Comorbidity 3+    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 hand, back surgeries (3), left THA, lung surgery, HTN, CAD, anxiety    Examination-Activity Limitations  Bathing;Dressing;Sleep;Reach Overhead;Lift    Stability/Clinical Decision Making  Stable/Uncomplicated     Rehab Potential  Excellent    PT Frequency  3x / week    PT Duration  4 weeks    PT Treatment/Interventions  ADLs/Self Care Home Management;Cryotherapy;Electrical Stimulation;Iontophoresis 50m/ml Dexamethasone;Moist Heat;Ultrasound;Therapeutic exercise;Neuromuscular re-education;Patient/family education;Manual techniques;Passive range of motion;Dry needling;Taping;Joint Manipulations;Spinal Manipulations    PT Next Visit Plan  continue progressive shoulder strengthening and shoulder stretches.  Progress HEP as tolerated.    PT Home Exercise Plan  [email or web address]    Consulted and Agree with Plan of Care  Patient       Patient will benefit from skilled therapeutic intervention in order to improve the following deficits and impairments:  Decreased range of motion, Decreased strength, Impaired flexibility, Impaired UE functional use, Pain  Visit Diagnosis: Acute pain of left shoulder  Stiffness of left shoulder, not elsewhere classified  Muscle weakness (generalized)     Problem List Patient Active Problem List   Diagnosis Date Noted  . Unilateral primary osteoarthritis, left hip 03/30/2017  . Status post total replacement of left hip 03/30/2017  . S/P lumbar spinal fusion 10/11/2015  . Sinus tachycardia 09/04/2014  . Hyperlipidemia    JKerin Perna PTA 02/09/20 12:11 PM  [address] [address]Phone: [phone]  Fax:  [phone] Name: Brandy DICKMANMRN: [id number] of Birth: 4Sep 10, 1957

## 2020-02-13 ENCOUNTER — Other Ambulatory Visit: Payer: Self-pay

## 2020-02-13 ENCOUNTER — Ambulatory Visit (INDEPENDENT_AMBULATORY_CARE_PROVIDER_SITE_OTHER): Payer: BC Managed Care – PPO | Admitting: Physical Therapy

## 2020-02-13 DIAGNOSIS — M6281 Muscle weakness (generalized): Secondary | ICD-10-CM | POA: Diagnosis not present

## 2020-02-13 DIAGNOSIS — R29898 Other symptoms and signs involving the musculoskeletal system: Secondary | ICD-10-CM | POA: Diagnosis not present

## 2020-02-13 DIAGNOSIS — M25512 Pain in left shoulder: Secondary | ICD-10-CM | POA: Diagnosis not present

## 2020-02-13 DIAGNOSIS — M25612 Stiffness of left shoulder, not elsewhere classified: Secondary | ICD-10-CM | POA: Diagnosis not present

## 2020-02-13 NOTE — Patient Instructions (Signed)
Access Code: [email or web address]: [email or web address]: 03/16/2021Prepared by: [facility] Kern[place]villeExercises  Seated Scapular Retraction - 3 x daily - 7 x weekly - 5 reps - 1 sets - 5 hold  Standing Row with Resistance - 1 x daily - 3 x weekly - 10 reps - 2 sets  Supine Shoulder Horizontal Abduction with Resistance - 1 x daily - 3 x weekly - 10 reps - 2 sets  Supine Bilateral Shoulder External Rotation with Resistance - 1 x daily - 3 x weekly - 10 reps - 2 sets  Supine PNF D2 Flexion with Resistance - 1 x daily - 3 x weekly - 2 sets - 10 reps  Sidelying Thoracic Rotation with Open Book - 1 x daily - 3 x weekly - 5-10 reps - 1 sets  Supine Chest Stretch with Elbows Bent - 2 x daily - 7 x weekly - 2 reps - 1 sets - 15 seconds hold  Sleeper Stretch - 1 x daily - 7 x weekly - 2 reps - 1 sets - 30 hold  Standing Shoulder Internal Rotation Stretch with Hands Behind Back - 1 x daily - 7 x weekly - 5 reps - 1 sets - 5-10 hold  Doorway Pec Stretch at 90 Degrees Abduction - 2 x daily - 7 x weekly - 3 reps - 1 sets - 20 seconds hold  Single Arm Doorway Pec Stretch at 120 Degrees Abduction - 2 x daily - 7 x weekly - 2 reps - 1 sets - 20 hold

## 2020-02-13 NOTE — Therapy (Signed)
[facility]  [place], [place], [address] Phone: [phone]   Fax:  [phone]  Physical Therapy Treatment  Patient Details  Name: Brandy Meyer MRN: [id number] Date of Birth: 21-Jul-1956 Referring Provider (PT): Netta Cedars, MD   Encounter Date: 02/13/2020  PT End of Session - 02/13/20 0932    Visit Number  7    Number of Visits  12    Date for PT Re-Evaluation  02/13/20    PT Start Time  0848    PT Stop Time  0928    PT Time Calculation (min)  40 min    Activity Tolerance  Patient tolerated treatment well;No increased pain    Behavior During Therapy  WFL for tasks assessed/performed       Past Medical History:  Diagnosis Date  . Anxiety   . Arthritis   . CAD in native artery    a. by coronary CT - medical therapy.  Marland Kitchen History of bronchitis    15 yrs ago  . Hyperlipidemia    takes Atorvastatin daily  . Hypertension    takes Metoprolol daily  . Joint pain   . Nocturia   . Osteopenia    by D[facility]A scan at [facility] on January 30, 2009  . PFO (patent foramen ovale)    a. small PFO by coronary CT (possible).  . Pneumonia    hx of-20 yrs ago  . Sinus tachycardia     Past Surgical History:  Procedure Laterality Date  . BACK SURGERY     x  2  . CESAREAN SECTION    . COLONOSCOPY    . LUNG SURGERY Left 2003   saw a spot on her lung , removed it, benign  . TOTAL HIP ARTHROPLASTY Left 03/30/2017   Procedure: LEFT TOTAL HIP ARTHROPLASTY ANTERIOR APPROACH;  Surgeon: Mcarthur Rossetti, MD;  Location: [place];  Service: Orthopedics;  Laterality: Left;    There were no vitals filed for this visit.  Subjective Assessment - 02/13/20 0900    Subjective  Pt reports she was able to complete yard work with lifting wood and rolling wheelbarrow without difficulty. Had soreness afterward, but "it was probably normal".    Pertinent History  OA in left hand, back surgeries (3), left THA, lung surgery, HTN, CAD,  anxiety    Patient Stated Goals  get rid of pain and get shoulder back to normal    Currently in Pain?  Yes    Pain Score  1     Pain Location  Shoulder    Pain Orientation  Left;Anterior    Pain Descriptors / Indicators  Tightness    Aggravating Factors   reaching overhead    Pain Relieving Factors  muscle roller analgesic         [facility] PT Assessment - 02/13/20 0001      Assessment   Medical Diagnosis  left shoulder pain    Referring Provider (PT)  Netta Cedars, MD    Onset Date/Surgical Date  11/10/19    Hand Dominance  Right    Next MD Visit  to be scheduled.     Prior Therapy  no      AROM   Right/Left Shoulder  Left    Left Shoulder Extension  64 Degrees    Left Shoulder Flexion  150 Degrees    Left Shoulder ABduction  150 Degrees    Left Shoulder Internal Rotation  --   thumb to T9  Left Shoulder External Rotation  82 Degrees      Strength   Left Shoulder Flexion  5/5    Left Shoulder Extension  5/5    Left Shoulder ABduction  5/5    Left Shoulder Internal Rotation  5/5    Left Shoulder External Rotation  4+/5       [facility] Adult PT Treatment/Exercise - 02/13/20 0001      Shoulder Exercises: Supine   Horizontal ABduction  Both;10 reps;Strengthening    Theraband Level (Shoulder Horizontal ABduction)  Level 2 (Red)    External Rotation  Both;10 reps;Strengthening    Theraband Level (Shoulder External Rotation)  Level 2 (Red)    Diagonals  Left;Strengthening;10 reps    Theraband Level (Shoulder Diagonals)  Level 1 (Yellow)    Diagonals Limitations  tactile cues for form      Shoulder Exercises: Standing   Shoulder Flexion Weight (lbs)  1, 2    Flexion Limitations  Lt shoulder lifting/lowering weight to shelf above eye height x 10 each wt.       Shoulder Exercises: ROM/Strengthening   UBE (Upper Arm Bike)  L2:  1 min forward, 1 min backward, standing.       Shoulder Exercises: Stretch   Corner Stretch  20 seconds;3 reps   arms at 90 deg   Internal  Rotation Stretch  4 reps   10 sec hold, UE assist bhind back   Wall Stretch - Flexion  1 rep;20 seconds    Star Gazer Stretch  1 rep;30 seconds    Other Shoulder Stretches  bilat should ext stretch x 20 sec x 2 reps              PT Education - 02/13/20 1149    Education Details  HEP updated (includes D2 flexion now)    Person(s) Educated  Patient    Methods  Explanation;Demonstration;Tactile cues;Verbal cues;Handout    Comprehension  Returned demonstration;Verbalized understanding          PT Long Term Goals - 02/13/20 0910      PT LONG TERM GOAL #1   Title  Ind with HEP for ROM and strengthening    Time  4    Period  Weeks    Status  Partially Met      PT LONG TERM GOAL #2   Title  Improved left shoulder ROM to [facility] to perform normal ADLS    Time  4    Period  Weeks    Status  Achieved      PT LONG TERM GOAL #3   Title  Patient able to sleep without waking from pain.    Time  4    Period  Weeks    Status  Achieved      PT LONG TERM GOAL #4   Title  Patient able to bathe and don/doff clothing without pain in left shoulder.    Time  4    Period  Weeks    Status  Achieved      PT LONG TERM GOAL #5   Title  Patient to demo 4+/5 strength in the left shoulder to ease ADLS    Time  4    Period  Weeks    Status  Achieved            Plan - 02/13/20 1151    Clinical Impression Statement  Pt demonstrating further improvement in Lt shoulder strength and ROM.  Pt has met a majority of goals  and is tolerating HEP exercises well.  Pt requests to hold therapy while she continues working on HEP.    Personal Factors and Comorbidities  Comorbidity 3+    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 hand, back surgeries (3), left THA, lung surgery, HTN, CAD, anxiety    Examination-Activity Limitations  Bathing;Dressing;Sleep;Reach Overhead;Lift    Stability/Clinical Decision Making  Stable/Uncomplicated    Rehab Potential  Excellent    PT Frequency  3x / week    PT Duration  4 weeks     PT Treatment/Interventions  ADLs/Self Care Home Management;Cryotherapy;Electrical Stimulation;Iontophoresis 5m/ml Dexamethasone;Moist Heat;Ultrasound;Therapeutic exercise;Neuromuscular re-education;Patient/family education;Manual techniques;Passive range of motion;Dry needling;Taping;Joint Manipulations;Spinal Manipulations    PT Next Visit Plan  will hold therapy until 03/14/20; pt to return if needing advancement of HEP or if flare up.    PT Home Exercise Plan  [email or web address]    Consulted and Agree with Plan of Care  Patient       Patient will benefit from skilled therapeutic intervention in order to improve the following deficits and impairments:  Decreased range of motion, Decreased strength, Impaired flexibility, Impaired UE functional use, Pain  Visit Diagnosis: Acute pain of left shoulder  Stiffness of left shoulder, not elsewhere classified  Muscle weakness (generalized)  Other symptoms and signs involving the musculoskeletal system     Problem List Patient Active Problem List   Diagnosis Date Noted  . Unilateral primary osteoarthritis, left hip 03/30/2017  . Status post total replacement of left hip 03/30/2017  . S/P lumbar spinal fusion 10/11/2015  . Sinus tachycardia 09/04/2014  . Hyperlipidemia    JKerin Perna PTA 02/13/20 11:56 AM  [facility] [address]Phone: [phone]  Fax:  [phone] Name: Brandy SCHELLINGMRN: [id number] of Birth: 4Aug 27, 1957

## 2020-02-15 ENCOUNTER — Other Ambulatory Visit: Payer: Self-pay

## 2020-02-15 ENCOUNTER — Other Ambulatory Visit: Payer: BC Managed Care – PPO | Admitting: *Deleted

## 2020-02-15 DIAGNOSIS — I10 Essential (primary) hypertension: Secondary | ICD-10-CM

## 2020-02-15 DIAGNOSIS — I2510 Atherosclerotic heart disease of native coronary artery without angina pectoris: Secondary | ICD-10-CM | POA: Diagnosis not present

## 2020-02-15 DIAGNOSIS — E782 Mixed hyperlipidemia: Secondary | ICD-10-CM | POA: Diagnosis not present

## 2020-02-15 LAB — LIPID PANEL
Chol/HDL Ratio: 2.8 ratio (ref 0.0–4.4)
Cholesterol, Total: 133 mg/dL (ref 100–199)
HDL: 47 mg/dL (ref >39)
LDL Chol Calc (NIH): 59 mg/dL (ref 0–99)
Triglycerides: 157 mg/dL — ABNORMAL HIGH (ref 0–149)
VLDL Cholesterol Cal: 27 mg/dL (ref 5–40)

## 2020-02-16 ENCOUNTER — Encounter: Payer: BC Managed Care – PPO | Admitting: Physical Therapy

## 2020-02-17 DIAGNOSIS — Z23 Encounter for immunization: Secondary | ICD-10-CM | POA: Diagnosis not present

## 2020-02-27 ENCOUNTER — Other Ambulatory Visit: Payer: Self-pay | Admitting: Student

## 2020-02-27 ENCOUNTER — Telehealth: Payer: Self-pay | Admitting: Interventional Cardiology

## 2020-02-27 DIAGNOSIS — M545 Low back pain, unspecified: Secondary | ICD-10-CM

## 2020-02-27 DIAGNOSIS — E782 Mixed hyperlipidemia: Secondary | ICD-10-CM

## 2020-02-27 NOTE — Telephone Encounter (Signed)
Daleen Bo I, RN  02/27/2020 3:56 PM EDT    The patient has been notified of the result and recommendations. Patient verbalized understanding. LIPIDS scheduled for 9/29. All questions (if any) were answered. Lattie Haw, RN 02/27/2020 3:55 PM    Daleen Bo I, RN  02/26/2020 12:08 PM EDT    Left message for patient to call back.   Daleen Bo I, RN  02/19/2020 9:47 AM EDT    Left message for patient to call back.   Dyann Kief, PA-C  02/16/2020 8:16 AM EDT    Triglicerides have come down to 157. Normal is under 149. Continue to limit sugars in diet and we can avoid adding any more meds. Repeat FLP in 6 months. thanks

## 2020-02-27 NOTE — Telephone Encounter (Signed)
Follow Up:     Returning your call from today.l

## 2020-03-14 ENCOUNTER — Other Ambulatory Visit: Payer: Self-pay

## 2020-03-14 ENCOUNTER — Ambulatory Visit
Admission: RE | Admit: 2020-03-14 | Discharge: 2020-03-14 | Disposition: A | Discharge status: Home / Self Care | Payer: BC Managed Care – PPO | Source: Ambulatory Visit | Attending: Student | Admitting: Student

## 2020-03-14 DIAGNOSIS — M545 Low back pain, unspecified: Secondary | ICD-10-CM

## 2020-03-16 DIAGNOSIS — Z23 Encounter for immunization: Secondary | ICD-10-CM | POA: Diagnosis not present

## 2020-03-28 DIAGNOSIS — M4316 Spondylolisthesis, lumbar region: Secondary | ICD-10-CM | POA: Diagnosis not present

## 2020-03-28 DIAGNOSIS — M48061 Spinal stenosis, lumbar region without neurogenic claudication: Secondary | ICD-10-CM | POA: Diagnosis not present

## 2020-04-04 DIAGNOSIS — M13842 Other specified arthritis, left hand: Secondary | ICD-10-CM | POA: Diagnosis not present

## 2020-04-04 DIAGNOSIS — M25532 Pain in left wrist: Secondary | ICD-10-CM | POA: Diagnosis not present

## 2020-04-04 DIAGNOSIS — M19032 Primary osteoarthritis, left wrist: Secondary | ICD-10-CM | POA: Diagnosis not present

## 2020-04-05 ENCOUNTER — Encounter: Payer: Self-pay | Admitting: Rehabilitative and Restorative Service Providers"

## 2020-04-05 ENCOUNTER — Other Ambulatory Visit: Payer: Self-pay

## 2020-04-05 ENCOUNTER — Ambulatory Visit (INDEPENDENT_AMBULATORY_CARE_PROVIDER_SITE_OTHER): Payer: BC Managed Care – PPO | Admitting: Rehabilitative and Restorative Service Providers"

## 2020-04-05 DIAGNOSIS — M545 Low back pain, unspecified: Secondary | ICD-10-CM

## 2020-04-05 DIAGNOSIS — R531 Weakness: Secondary | ICD-10-CM | POA: Diagnosis not present

## 2020-04-05 DIAGNOSIS — M2560 Stiffness of unspecified joint, not elsewhere classified: Secondary | ICD-10-CM | POA: Diagnosis not present

## 2020-04-05 DIAGNOSIS — R29898 Other symptoms and signs involving the musculoskeletal system: Secondary | ICD-10-CM

## 2020-04-05 NOTE — Patient Instructions (Signed)
Access Code: [email or web address]: [email or web address]: 05/07/2021Prepared by: Karmina Zufall HoltExercises  Hooklying Hamstring Stretch with Strap - 2 x daily - 7 x weekly - 3 reps - 1 sets - 30 sec hold  Supine Piriformis Stretch with Leg Straight - 2 x daily - 7 x weekly - 3 reps - 1 sets - 30 sec hold  Hook[place] Single Knee to Chest Stretch - 2 x daily - 7 x weekly - 3 reps - 1 sets - 30 sec hold Patient Education  TENS Unit
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
## 2020-04-10 ENCOUNTER — Other Ambulatory Visit: Payer: Self-pay

## 2020-04-10 ENCOUNTER — Ambulatory Visit: Payer: BC Managed Care – PPO | Admitting: Physical Therapy

## 2020-04-10 DIAGNOSIS — M545 Low back pain, unspecified: Secondary | ICD-10-CM

## 2020-04-10 DIAGNOSIS — R29898 Other symptoms and signs involving the musculoskeletal system: Secondary | ICD-10-CM

## 2020-04-10 DIAGNOSIS — R531 Weakness: Secondary | ICD-10-CM

## 2020-04-10 DIAGNOSIS — M2560 Stiffness of unspecified joint, not elsewhere classified: Secondary | ICD-10-CM

## 2020-04-10 NOTE — Therapy (Signed)
[facility] [place] [address] [place] [address] [place], [place], [address] Phone: [phone]   Fax:  [phone]  Physical Therapy Treatment  Patient Details  Name: Brandy Meyer MRN: [id number] Date of Birth: 11-Dec-1955 Referring Provider (PT): Dr Marikay Alar    Encounter Date: 04/10/2020  PT End of Session - 04/10/20 1645    Visit Number  2    Number of Visits  12    Date for PT Re-Evaluation  05/17/20    PT Start Time  1601    PT Stop Time  1650    PT Time Calculation (min)  49 min    Activity Tolerance  Patient tolerated treatment well;No increased pain       Past Medical History:  Diagnosis Date  . Anxiety   . Arthritis   . CAD in native artery    a. by coronary CT - medical therapy.  Marland Kitchen History of bronchitis    15 yrs ago  . Hyperlipidemia    takes Atorvastatin daily  . Hypertension    takes Metoprolol daily  . Joint pain   . Nocturia   . Osteopenia    by D[facility] scan at [facility] on January 30, 2009  . PFO (patent foramen ovale)    a. small PFO by coronary CT (possible).  . Pneumonia    hx of-20 yrs ago  . Sinus tachycardia     Past Surgical History:  Procedure Laterality Date  . BACK SURGERY     x  2  . CESAREAN SECTION    . COLONOSCOPY    . LUNG SURGERY Left 2003   saw a spot on her lung , removed it, benign  . TOTAL HIP ARTHROPLASTY Left 03/30/2017   Procedure: LEFT TOTAL HIP ARTHROPLASTY ANTERIOR APPROACH;  Surgeon: Kathryne Hitch, MD;  [facility]: [facility];  Service: Orthopedics;  Laterality: Left;    There were no vitals filed for this visit.  Subjective Assessment - 04/10/20 1614    Subjective  Pt reports she bought TENS unit; would like to learn how to use it.  Otherwise no changes.    Pertinent History  OA in left hand, back surgeries (3), left THA, lung surgery, HTN, CAD, anxiety    Patient Stated Goals  get rid of back pain    Currently in Pain?  Yes    Pain Score  3     Pain Location  Shoulder    Pain  Orientation  Left;Right;Lower    Pain Descriptors / Indicators  Aching;Dull    Aggravating Factors   bending over, Lifting    Pain Relieving Factors  muscle relaxer, heat         [facility] PT Assessment - 04/10/20 0001      Assessment   Medical Diagnosis  LBP     Referring Provider (PT)  Dr Marikay Alar     Onset Date/Surgical Date  11/10/19    Hand Dominance  Right    Next MD Visit  11/21    Prior Therapy  no      Flexibility   Quadriceps  RLE: 116 deg; LLE: 97 deg        [facility] Adult PT Treatment/Exercise - 04/10/20 0001      Self-Care   Other Self-Care Comments   pt educated on TENS application, safety, and parameters; pt verbalized understanding.       Lumbar Exercises: Stretches   Passive Hamstring Stretch  Right;Left;3 reps;20 seconds    Single Knee  to Chest Stretch  Right;Left;2 reps;20 seconds    Prone on Elbows Stretch  1 rep;10 seconds    Prone on Elbows Stretch Limitations  discomfort in low back    Radiation protection practitioner Limitations  some cramping in hamstrings    Piriformis Stretch  Right;Left;2 reps;20 seconds   supine travell    Piriformis Stretch Limitations  3rd rep to opp shoulder.      Lumbar Exercises: Aerobic   Nustep  L4: 5.5 min, arms/ legs       Lumbar Exercises: Supine   Bridge  10 reps;3 seconds    Bridge Limitations  some cramping in LLE      Lumbar Exercises: Prone   Opposite Arm/Leg Raise  Right arm/Left leg;Left arm/Right leg;5 reps      Moist Heat Therapy   Number Minutes Moist Heat  10 Minutes    Moist Heat Location  Lumbar Spine      Electrical Stimulation   Electrical Stimulation Location  bilat lumbar     Electrical Stimulation Action  TENS    Electrical Stimulation Parameters  to tolerance    Electrical Stimulation Goals  Pain             PT Education - 04/10/20 1655    Education Details  HEP, posture and body mechanics.    Person(s) Educated  Patient    Methods   Explanation;Handout;Demonstration;Verbal cues;Tactile cues    Comprehension  Verbalized understanding;Returned demonstration          PT Long Term Goals - 04/05/20 1431      PT LONG TERM GOAL #1   Title  Decrease LBP by 75-80% allowing patient to return to prior level of function    Time  6    Period  Weeks    Status  New    Target Date  05/17/20      PT LONG TERM GOAL #2   Title  Increase AROM through lumbar spine and bilat hips to WFL's with minimal discomfort and no pain    Time  6    Period  Weeks    Status  New    Target Date  05/17/20      PT LONG TERM GOAL #3   Title  Patient reports ability to walk for 20-30 min without increase in pain    Time  6    Period  Weeks    Status  New    Target Date  05/17/20      PT LONG TERM GOAL #4   Title  Independent in HEP    Baseline  -    Time  6    Period  Weeks    Status  New    Target Date  05/17/20      PT LONG TERM GOAL #5   Title  Improve FOTO to </= 39% limitation    Time  6    Period  Weeks    Status  New    Target Date  05/17/20            Plan - 04/10/20 1645    Clinical Impression Statement  Pt presents with some fascial tightness in Lt quad (compared to RLE) and bilat hips.  she tolerated exercises without increase in LBP.  Goals are ongoing.    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 hand, back surgeries (3), left THA, lung surgery, HTN, CAD, anxiety    Stability/Clinical Decision Making  Stable/Uncomplicated  Rehab Potential  Good    PT Frequency  2x / week    PT Duration  6 weeks    PT Treatment/Interventions  ADLs/Self Care Home Management;Cryotherapy;Electrical Stimulation;Iontophoresis 4mg /ml Dexamethasone;Moist Heat;Ultrasound;Therapeutic exercise;Neuromuscular re-education;Patient/family education;Manual techniques;Passive range of motion;Dry needling;Taping;Joint Manipulations;Spinal Manipulations    PT Next Visit Plan  continue to progress HEP with stretching and core stabilization    PT Home Exercise  Plan  [email or web address]    Consulted and Agree with Plan of Care  Patient       Patient will benefit from skilled therapeutic intervention in order to improve the following deficits and impairments:  Decreased range of motion, Decreased strength, Impaired flexibility, Impaired UE functional use, Pain  Visit Diagnosis: Acute bilateral low back pain without sciatica  Weakness generalized  Joint stiffness of spine  Other symptoms and signs involving the musculoskeletal system     Problem List Patient Active Problem List   Diagnosis Date Noted  . Unilateral primary osteoarthritis, left hip 03/30/2017  . Status post total replacement of left hip 03/30/2017  . S/P lumbar spinal fusion 10/11/2015  . Sinus tachycardia 09/04/2014  . Hyperlipidemia    11/04/2014, PTA 04/10/20 5:00 PM  [facility] [place] [address] [place] [address] [place], [place], [place] Phone: [phone]   Fax:  [phone]  Name: Brandy Meyer MRN: Richardo Hanks Date of Birth: [birth year]

## 2020-04-10 NOTE — Patient Instructions (Signed)

## 2020-04-12 ENCOUNTER — Ambulatory Visit (INDEPENDENT_AMBULATORY_CARE_PROVIDER_SITE_OTHER): Payer: BC Managed Care – PPO | Admitting: Physical Therapy

## 2020-04-12 ENCOUNTER — Other Ambulatory Visit: Payer: Self-pay

## 2020-04-12 ENCOUNTER — Encounter: Payer: Self-pay | Admitting: Physical Therapy

## 2020-04-12 DIAGNOSIS — M2560 Stiffness of unspecified joint, not elsewhere classified: Secondary | ICD-10-CM | POA: Diagnosis not present

## 2020-04-12 DIAGNOSIS — M545 Low back pain, unspecified: Secondary | ICD-10-CM

## 2020-04-12 DIAGNOSIS — R531 Weakness: Secondary | ICD-10-CM | POA: Diagnosis not present

## 2020-04-12 DIAGNOSIS — R29898 Other symptoms and signs involving the musculoskeletal system: Secondary | ICD-10-CM

## 2020-04-12 NOTE — Therapy (Signed)
[place], [place], [address] Phone: [phone]   Fax:  [phone]  Physical Therapy Treatment  Patient Details  Name: Brandy Meyer MRN: [id number] Date of Birth: February 01, 1956 Referring Provider (PT): Dr Sherley Bounds    Encounter Date: 04/12/2020  PT End of Session - 04/12/20 0808    Visit Number  3    Number of Visits  12    Date for PT Re-Evaluation  05/17/20    PT Start Time  0804    PT Stop Time  3474    PT Time Calculation (min)  45 min    Activity Tolerance  Patient tolerated treatment well;No increased pain    Behavior During Therapy  WFL for tasks assessed/performed       Past Medical History:  Diagnosis Date  . Anxiety   . Arthritis   . CAD in native artery    a. by coronary CT - medical therapy.  Marland Kitchen History of bronchitis    15 yrs ago  . Hyperlipidemia    takes Atorvastatin daily  . Hypertension    takes Metoprolol daily  . Joint pain   . Nocturia   . Osteopenia    by DEXA scan at [facility] on January 30, 2009  . PFO (patent foramen ovale)    a. small PFO by coronary CT (possible).  . Pneumonia    hx of-20 yrs ago  . Sinus tachycardia     Past Surgical History:  Procedure Laterality Date  . BACK SURGERY     x  2  . CESAREAN SECTION    . COLONOSCOPY    . LUNG SURGERY Left 2003   saw a spot on her lung , removed it, benign  . TOTAL HIP ARTHROPLASTY Left 03/30/2017   Procedure: LEFT TOTAL HIP ARTHROPLASTY ANTERIOR APPROACH;  Surgeon: Mcarthur Rossetti, MD;  Location: [facility];  Service: Orthopedics;  Laterality: Left;    There were no vitals filed for this visit.  Subjective Assessment - 04/12/20 2595    Subjective  Work has been busy; only able to complete 2 exercises yesterday.  No new changes.    Currently in Pain?  Yes    Pain Score  2     Pain Location  Back    Pain Orientation  Right;Left;Lower    Pain Descriptors / Indicators  Simonne Martinet PT  Assessment - 04/12/20 0001      Assessment   Medical Diagnosis  LBP     Referring Provider (PT)  Dr Sherley Bounds     Onset Date/Surgical Date  11/10/19    Hand Dominance  Right    Next MD Visit  11/21    Prior Therapy  no       [facility] Adult PT Treatment/Exercise - 04/12/20 0001      Lumbar Exercises: Stretches   Passive Hamstring Stretch  Right;Left;2 reps;20 seconds    Hip Flexor Stretch  Right;Left;2 reps;20 seconds   seated    Quad Stretch  Right;Left;2 reps;20 seconds    Piriformis Stretch  Right;Left;2 reps;20 seconds   seated      Lumbar Exercises: Aerobic   Nustep  L4: (seat at #4) 4 min, arms/ legs       Lumbar Exercises: Seated   Sit to Stand  5 reps   with ab set     Lumbar Exercises: Supine   Ab Set  5 reps;5 seconds  Bridge  10 reps;3 seconds      Lumbar Exercises: Prone   Opposite Arm/Leg Raise  Right arm/Left leg;Left arm/Right leg;10 reps      Shoulder Exercises: Stretch   Other Shoulder Stretches  mid-level doorway stretch x 20 sec       Moist Heat Therapy   Number Minutes Moist Heat  10 Minutes    Moist Heat Location  Lumbar Spine      Electrical Stimulation   Electrical Stimulation Location  bilat lumbar musculature    Electrical Stimulation Action  IFC    Electrical Stimulation Parameters  intensity to tolerance x 10 min     Electrical Stimulation Goals  Pain             PT Education - 04/12/20 0818    Education Details  HEP - added seated hip flexor/ piriformis stretch    Person(s) Educated  Patient    Methods  Explanation;Demonstration;Verbal cues;Handout    Comprehension  Returned demonstration;Verbalized understanding          PT Long Term Goals - 04/05/20 1431      PT LONG TERM GOAL #1   Title  Decrease LBP by 75-80% allowing patient to return to prior level of function    Time  6    Period  Weeks    Status  New    Target Date  05/17/20      PT LONG TERM GOAL #2   Title  Increase AROM through lumbar spine and bilat  hips to WFL's with minimal discomfort and no pain    Time  6    Period  Weeks    Status  New    Target Date  05/17/20      PT LONG TERM GOAL #3   Title  Patient reports ability to walk for 20-30 min without increase in pain    Time  6    Period  Weeks    Status  New    Target Date  05/17/20      PT LONG TERM GOAL #4   Title  Independent in HEP    Baseline  -    Time  6    Period  Weeks    Status  New    Target Date  05/17/20      PT LONG TERM GOAL #5   Title  Improve FOTO to </= 39% limitation    Time  6    Period  Weeks    Status  New    Target Date  05/17/20            Plan - 04/12/20 0833    Clinical Impression Statement  Pt tolerated exercises well, without any cramping in LE this date.  Able to contract to TA with minimal cues.  Progressing well towards goals.    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 hand, back surgeries (3), left THA, lung surgery, HTN, CAD, anxiety    Stability/Clinical Decision Making  Stable/Uncomplicated    Rehab Potential  Good    PT Frequency  2x / week    PT Duration  6 weeks    PT Treatment/Interventions  ADLs/Self Care Home Management;Cryotherapy;Electrical Stimulation;Iontophoresis 4mg /ml Dexamethasone;Moist Heat;Ultrasound;Therapeutic exercise;Neuromuscular re-education;Patient/family education;Manual techniques;Passive range of motion;Dry needling;Taping;Joint Manipulations;Spinal Manipulations    PT Next Visit Plan  continue to progress HEP with stretching and core stabilization    PT Home Exercise Plan  [email or web address]    Consulted and Agree with Plan of Care  Patient  Patient will benefit from skilled therapeutic intervention in order to improve the following deficits and impairments:  Decreased range of motion, Decreased strength, Impaired flexibility, Impaired UE functional use, Pain  Visit Diagnosis: Acute bilateral low back pain without sciatica  Weakness generalized  Joint stiffness of spine  Other symptoms and signs involving the  musculoskeletal system     Problem List Patient Active Problem List   Diagnosis Date Noted  . Unilateral primary osteoarthritis, left hip 03/30/2017  . Status post total replacement of left hip 03/30/2017  . S/P lumbar spinal fusion 10/11/2015  . Sinus tachycardia 09/04/2014  . Hyperlipidemia    Mayer Camel, PTA 04/12/20 8:46 AM  [facility] [address] [place] [address] [place], [place], [address] Phone: [phone]   Fax:  [phone]  Name: VANNIE HILGERT MRN: [id number] Date of Birth: [birth year]

## 2020-04-17 ENCOUNTER — Encounter: Payer: Self-pay | Admitting: Physical Therapy

## 2020-04-17 ENCOUNTER — Ambulatory Visit (INDEPENDENT_AMBULATORY_CARE_PROVIDER_SITE_OTHER): Payer: BC Managed Care – PPO | Admitting: Physical Therapy

## 2020-04-17 ENCOUNTER — Other Ambulatory Visit: Payer: Self-pay

## 2020-04-17 DIAGNOSIS — M2560 Stiffness of unspecified joint, not elsewhere classified: Secondary | ICD-10-CM | POA: Diagnosis not present

## 2020-04-17 DIAGNOSIS — R29898 Other symptoms and signs involving the musculoskeletal system: Secondary | ICD-10-CM

## 2020-04-17 DIAGNOSIS — M545 Low back pain, unspecified: Secondary | ICD-10-CM

## 2020-04-17 DIAGNOSIS — R531 Weakness: Secondary | ICD-10-CM

## 2020-04-17 NOTE — Patient Instructions (Signed)
Access Code: [email or web address]: [email or web address]: 05/12/2021Prepared by: [facility] [place]Exercises  Prone Quadriceps Stretch with Strap - 1 x daily - 7 x weekly - 1 sets - 2-3 reps - 30 hold  Prone Alternating Arm and Leg Lifts - 1 x daily - 3 x weekly - 2 sets - 5 reps  Supine Double Knee to Chest - 1 x daily - 7 x weekly - 1 sets - 2 reps - 20 hold  Supine Bridge - 1 x daily - 7 x weekly - 1 sets - 10 reps  Seated Piriformis Stretch - 2 x daily - 7 x weekly - 1 sets - 2 reps - 20 hold  Seated Hamstring Stretch - 2 x daily - 7 x weekly - 1 sets - 2-3 reps - 20 hold  Seated Table Piriformis Stretch - 1 x daily - 7 x weekly - 1 sets - 2 reps - 20 hold  Seated Sidebending - 2 x daily - 7 x weekly - 1 sets - 2 reps - 15 hold  Seated Transversus Abdominis Bracing - 1 x daily - 7 x weekly - 1 sets - 5 reps - 5-10 hold

## 2020-04-17 NOTE — Therapy (Addendum)
[facility] [place] [address] [place], [place], [address] Phone: [phone]   Fax:  [phone]  Physical Therapy Treatment  Patient Details  Name: Brandy Meyer MRN: [id number] Date of Birth: [birth year] Referring Provider (PT): Dr Marikay Alar    Encounter Date: 04/17/2020  PT End of Session - 04/17/20 0808    Visit Number  4    Number of Visits  12    Date for PT Re-Evaluation  05/17/20    PT Start Time  0803    PT Stop Time  0850    PT Time Calculation (min)  47 min    Activity Tolerance  Patient tolerated treatment well    Behavior During Therapy  [facility] for tasks assessed/performed       Past Medical History:  Diagnosis Date  . Anxiety   . Arthritis   . CAD in native artery    a. by coronary CT - medical therapy.  Marland Kitchen History of bronchitis    15 yrs ago  . Hyperlipidemia    takes Atorvastatin daily  . Hypertension    takes Metoprolol daily  . Joint pain   . Nocturia   . Osteopenia    by D[facility]A scan at [facility] on January 30, 2009  . PFO (patent foramen ovale)    a. small PFO by coronary CT (possible).  . Pneumonia    hx of-20 yrs ago  . Sinus tachycardia     Past Surgical History:  Procedure Laterality Date  . BACK SURGERY     x  2  . CESAREAN SECTION    . COLONOSCOPY    . LUNG SURGERY Left 2003   saw a spot on her lung , removed it, benign  . TOTAL HIP ARTHROPLASTY Left 03/30/2017   Procedure: LEFT TOTAL HIP ARTHROPLASTY ANTERIOR APPROACH;  Surgeon: Kathryne Hitch, MD;  Location: [facility] OR;  Service: Orthopedics;  Laterality: Left;    There were no vitals filed for this visit.  Subjective Assessment - 04/17/20 0811    Subjective  Last couple of days, she has had episodes of "catching" in her Rt lower back, if she moves too quick.  She is experiencing less aching across lower back.  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  Yes    Pain Score  2     Pain Location  Back    Pain Orientation  Right;Lower    Pain Descriptors / Indicators  Dull    Aggravating Factors   bending over    Pain Relieving Factors  heat         [facility] PT Assessment - 04/17/20 0001      Assessment   Medical Diagnosis  LBP     Referring Provider (PT)  Dr Marikay Alar     Onset Date/Surgical Date  11/10/19    Hand Dominance  Right    Next MD Visit  11/21    Prior Therapy  no      Flexibility   Quadriceps  RLE: 120, LLE 105       [facility] Adult PT Treatment/Exercise - 04/17/20 0001      Lumbar Exercises: Stretches   Passive Hamstring Stretch  Right;Left;2 reps;20 seconds   seated   Double  Knee to Chest Stretch  --   verbally reviewed   Hip Flexor Stretch  Right;Left;2 reps;30 seconds   seated    Standing Side Bend  Left;1 rep;10 seconds    Quad Stretch  Right;Left;2 reps;20 seconds   prone with strap   Piriformis Stretch  Right;Left;2 reps;20 seconds   seated, then mod pigeon pose   Other Lumbar Stretch Exercise  seated Lt sidebend x 10 sec       Lumbar Exercises: Aerobic   Nustep  L4: (seat at #4) 6 min, arms/ legs       Moist Heat Therapy   Number Minutes Moist Heat  10 Minutes    Moist Heat Location  Lumbar Spine      Electrical Stimulation   Electrical Stimulation Location  Rt QL area    Electrical Stimulation Action  IFC    Electrical Stimulation Parameters  10 min, intensity to tolerance     Electrical Stimulation Goals  Pain;Tone      Manual Therapy   Manual therapy comments  pt in prone and supine; hypersensitive to touch over Rt QL today.     Soft tissue mobilization  TPR (gentle) to Rt QL              PT Education - 04/17/20 0923    Education Details  HEP    Person(s) Educated  Patient    Methods  Handout;Explanation    Comprehension  Verbalized understanding          PT Long Term Goals -  04/05/20 1431      PT LONG TERM GOAL #1   Title  Decrease LBP by 75-80% allowing patient to return to prior level of function    Time  6    Period  Weeks    Status  New    Target Date  05/17/20      PT LONG TERM GOAL #2   Title  Increase AROM through lumbar spine and bilat hips to WFL's with minimal discomfort and no pain    Time  6    Period  Weeks    Status  New    Target Date  05/17/20      PT LONG TERM GOAL #3   Title  Patient reports ability to walk for 20-30 min without increase in pain    Time  6    Period  Weeks    Status  New    Target Date  05/17/20      PT LONG TERM GOAL #4   Title  Independent in HEP    Baseline  -    Time  6    Period  Weeks    Status  New    Target Date  05/17/20      PT LONG TERM GOAL #5   Title  Improve FOTO to </= 39% limitation    Time  6    Period  Weeks    Status  New    Target Date  05/17/20            Plan - 04/17/20 0909    Clinical Impression Statement  Pt had flare up of pain in Rt low back; 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  Pt very tight and tender in Rt lumbar musculature; limited tolerance for STM to this area.  Reviewed HEP and modified. Lt hip flexibility improving. Goals are ongoing.    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 hand, back surgeries (3), left  THA, lung surgery, HTN, CAD, anxiety    Stability/Clinical Decision Making  Stable/Uncomplicated    Rehab Potential  Good    PT Frequency  2x / week    PT Duration  6 weeks    PT Treatment/Interventions  ADLs/Self Care Home Management;Cryotherapy;Electrical Stimulation;Iontophoresis 4mg /ml Dexamethasone;Moist Heat;Ultrasound;Therapeutic exercise;Neuromuscular re-education;Patient/family education;Manual techniques;Passive range of motion;Dry needling;Taping;Joint Manipulations;Spinal Manipulations    PT Next Visit Plan  continue to progress HEP with stretching and core stabilization    PT Home Exercise Plan  [id number]    Consulted and Agree with Plan of Care  Patient        Patient will benefit from skilled therapeutic intervention in order to improve the following deficits and impairments:  Decreased range of motion, Decreased strength, Impaired flexibility, Impaired UE functional use, Pain  Visit Diagnosis: Acute bilateral low back pain without sciatica  Weakness generalized  Joint stiffness of spine  Other symptoms and signs involving the musculoskeletal system     Problem List Patient Active Problem List   Diagnosis Date Noted  . Unilateral primary osteoarthritis, left hip 03/30/2017  . Status post total replacement of left hip 03/30/2017  . S/P lumbar spinal fusion 10/11/2015  . Sinus tachycardia 09/04/2014  . Hyperlipidemia    11/04/2014, PTA 04/17/20 9:27 AM  [facility] [address] [place] [address] [place], [place], [place] Phone: [phone]   Fax:  [phone]  Name: Brandy Meyer MRN: Richardo Hanks Date of Birth: 1956/05/13

## 2020-04-19 ENCOUNTER — Other Ambulatory Visit: Payer: Self-pay

## 2020-04-19 ENCOUNTER — Ambulatory Visit (INDEPENDENT_AMBULATORY_CARE_PROVIDER_SITE_OTHER): Payer: BC Managed Care – PPO | Admitting: Physical Therapy

## 2020-04-19 DIAGNOSIS — M2560 Stiffness of unspecified joint, not elsewhere classified: Secondary | ICD-10-CM

## 2020-04-19 DIAGNOSIS — M545 Low back pain, unspecified: Secondary | ICD-10-CM

## 2020-04-19 DIAGNOSIS — R29898 Other symptoms and signs involving the musculoskeletal system: Secondary | ICD-10-CM | POA: Diagnosis not present

## 2020-04-19 DIAGNOSIS — R531 Weakness: Secondary | ICD-10-CM

## 2020-04-19 NOTE — Patient Instructions (Signed)

## 2020-04-19 NOTE — Therapy (Signed)
[facility] [place] [address] [place] [address] [place], [place], [address] Phone: [phone]   Fax:  [phone]  Physical Therapy Treatment  Patient Details  Name: Brandy Meyer MRN: [id number] Date of Birth: September 27, 1956 Referring Provider (PT): Dr Marikay Alar    Encounter Date: 04/19/2020  PT End of Session - 04/19/20 0908    Visit Number  5    Number of Visits  12    Date for PT Re-Evaluation  05/17/20    PT Start Time  0804    PT Stop Time  0850    PT Time Calculation (min)  46 min    Activity Tolerance  Patient tolerated treatment well    Behavior During Therapy  [facility] for tasks assessed/performed       Past Medical History:  Diagnosis Date  . Anxiety   . Arthritis   . CAD in native artery    a. by coronary CT - medical therapy.  Marland Kitchen History of bronchitis    15 yrs ago  . Hyperlipidemia    takes Atorvastatin daily  . Hypertension    takes Metoprolol daily  . Joint pain   . Nocturia   . Osteopenia    by DEXA scan at [facility] on January 30, 2009  . PFO (patent foramen ovale)    a. small PFO by coronary CT (possible).  . Pneumonia    hx of-20 yrs ago  . Sinus tachycardia     Past Surgical History:  Procedure Laterality Date  . BACK SURGERY     x  2  . CESAREAN SECTION    . COLONOSCOPY    . LUNG SURGERY Left 2003   saw a spot on her lung , removed it, benign  . TOTAL HIP ARTHROPLASTY Left 03/30/2017   Procedure: LEFT TOTAL HIP ARTHROPLASTY ANTERIOR APPROACH;  Surgeon: Kathryne Hitch, MD;  Location: [facility] OR;  Service: Orthopedics;  Laterality: Left;    There were no vitals filed for this visit.  Subjective Assessment - 04/19/20 0805    Subjective  Pt reports she used TENS at work and at home with some relief.  She reports difficulty with one of the exercises (sidebending). She has been completing HEP 2x/day.    Currently in Pain?  Yes    Pain Score  3     Pain Location  Back    Pain Orientation  Right;Lower    Pain  Descriptors / Indicators  Aching   catching   Aggravating Factors   bending over, twisting    Pain Relieving Factors  heat         [facility] PT Assessment - 04/19/20 0001      Assessment   Medical Diagnosis  LBP     Referring Provider (PT)  Dr Marikay Alar     Onset Date/Surgical Date  11/10/19    Hand Dominance  Right    Next MD Visit  11/21    Prior Therapy  no        [facility] Adult PT Treatment/Exercise - 04/19/20 0001      Lumbar Exercises: Stretches   Standing Side Bend Limitations  switched HEP to have modified childs pose with Lt lateral flexion    Quad Stretch  Left;3 reps;Right;2 reps;30 seconds   prone with strap   Piriformis Stretch  --   verbally reviewed.    Other Lumbar Stretch Exercise  forward flexion in seated x 15 sec x 2 reps; seated modified childs pose with arms  rolling forward on red pball x 10 sec, 2 reps; repeated with Lt lumbar flexion x 2 reps     Other Lumbar Stretch Exercise  Open book, 6 reps (4 without resistance, 2 with yellow band) each side.       Lumbar Exercises: Aerobic   Nustep  L4: (seat at #4) 5 min, legs       Lumbar Exercises: Supine   Bridge  10 reps;2 seconds   TA engaged. Pain free     Moist Heat Therapy   Number Minutes Moist Heat  10 Minutes    Moist Heat Location  Lumbar Spine      Electrical Stimulation   Electrical Stimulation Location  Rt lumbar paraspinals    Electrical Stimulation Action  IFC    Electrical Stimulation Parameters  10 min, intensity to tolerance    Electrical Stimulation Goals  Pain;Tone      Manual Therapy   Manual therapy comments  I strip of sensitive skin Rock tap applied to Rt thoracic paraspinals (around T10) with 50% stretch to decompress tissue and increase proprioception     Soft tissue mobilization  IASTM to Rt lumbar and lower thoracic paraspinals/ QL              PT Education - 04/19/20 0845    Education Details  DN info    Person(s) Educated  Patient    Methods  Explanation;Handout     Comprehension  Verbalized understanding          PT Long Term Goals - 04/05/20 1431      PT LONG TERM GOAL #1   Title  Decrease LBP by 75-80% allowing patient to return to prior level of function    Time  6    Period  Weeks    Status  New    Target Date  05/17/20      PT LONG TERM GOAL #2   Title  Increase AROM through lumbar spine and bilat hips to WFL's with minimal discomfort and no pain    Time  6    Period  Weeks    Status  New    Target Date  05/17/20      PT LONG TERM GOAL #3   Title  Patient reports ability to walk for 20-30 min without increase in pain    Time  6    Period  Weeks    Status  New    Target Date  05/17/20      PT LONG TERM GOAL #4   Title  Independent in HEP    Baseline  -    Time  6    Period  Weeks    Status  New    Target Date  05/17/20      PT LONG TERM GOAL #5   Title  Improve FOTO to </= 39% limitation    Time  6    Period  Weeks    Status  New    Target Date  05/17/20            Plan - 04/19/20 0904    Clinical Impression Statement  Continued point tenderness and tightness in pt's Rt thoracic and lumbar paraspinals; pt tolerated IASTM better than gentle STM with hands.  Pt reported reduction of Rt lumbar pain after open book and modified childs pose stretches. Pt progressing gradually towards LTGs.    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 hand, back surgeries (3), left THA, lung surgery, HTN, CAD,  anxiety    Stability/Clinical Decision Making  Stable/Uncomplicated    Rehab Potential  Good    PT Frequency  2x / week    PT Duration  6 weeks    PT Treatment/Interventions  ADLs/Self Care Home Management;Cryotherapy;Electrical Stimulation;Iontophoresis 4mg /ml Dexamethasone;Moist Heat;Ultrasound;Therapeutic exercise;Neuromuscular re-education;Patient/family education;Manual techniques;Passive range of motion;Dry needling;Taping;Joint Manipulations;Spinal Manipulations    PT Next Visit Plan  assess response to tape, IASTM, continue to progress  HEP with stretching and core stabilization    PT Home Exercise Plan  [id number]    Consulted and Agree with Plan of Care  Patient       Patient will benefit from skilled therapeutic intervention in order to improve the following deficits and impairments:  Decreased range of motion, Decreased strength, Impaired flexibility, Impaired UE functional use, Pain  Visit Diagnosis: Acute bilateral low back pain without sciatica  Weakness generalized  Joint stiffness of spine  Other symptoms and signs involving the musculoskeletal system     Problem List Patient Active Problem List   Diagnosis Date Noted  . Unilateral primary osteoarthritis, left hip 03/30/2017  . Status post total replacement of left hip 03/30/2017  . S/P lumbar spinal fusion 10/11/2015  . Sinus tachycardia 09/04/2014  . Hyperlipidemia    11/04/2014, PTA 04/19/20 9:11 AM  [facility] [address] [place] [address] [place], [place], [place] Phone: [phone]   Fax:  [phone]  Name: Brandy Meyer MRN: Richardo Hanks Date of Birth: 06-19-1956

## 2020-04-22 ENCOUNTER — Other Ambulatory Visit: Payer: Self-pay

## 2020-04-22 ENCOUNTER — Encounter: Payer: Self-pay | Admitting: Physical Therapy

## 2020-04-22 ENCOUNTER — Ambulatory Visit (INDEPENDENT_AMBULATORY_CARE_PROVIDER_SITE_OTHER): Payer: BC Managed Care – PPO | Admitting: Physical Therapy

## 2020-04-22 DIAGNOSIS — M545 Low back pain, unspecified: Secondary | ICD-10-CM

## 2020-04-22 DIAGNOSIS — R531 Weakness: Secondary | ICD-10-CM

## 2020-04-22 DIAGNOSIS — R29898 Other symptoms and signs involving the musculoskeletal system: Secondary | ICD-10-CM | POA: Diagnosis not present

## 2020-04-22 DIAGNOSIS — M2560 Stiffness of unspecified joint, not elsewhere classified: Secondary | ICD-10-CM

## 2020-04-22 NOTE — Therapy (Signed)
[facility] [place] [address] [place] [address] [place], [place], [address] Phone: [phone]   Fax:  [phone]  Physical Therapy Treatment  Patient Details  Name: Brandy Meyer MRN: [id number] Date of Birth: Mar 01, 1956 Referring Provider (PT): Dr Marikay Alar    Encounter Date: 04/22/2020  PT End of Session - 04/22/20 0817    Visit Number  6    Number of Visits  12    Date for PT Re-Evaluation  05/17/20    PT Start Time  0809    PT Stop Time  0855    PT Time Calculation (min)  46 min    Activity Tolerance  Patient tolerated treatment well    Behavior During Therapy  [facility] for tasks assessed/performed       Past Medical History:  Diagnosis Date  . Anxiety   . Arthritis   . CAD in native artery    a. by coronary CT - medical therapy.  Marland Kitchen History of bronchitis    15 yrs ago  . Hyperlipidemia    takes Atorvastatin daily  . Hypertension    takes Metoprolol daily  . Joint pain   . Nocturia   . Osteopenia    by [facility] scan [facility] on [facility] 30, 2009  . PFO (patent foramen ovale)    a. small PFO by coronary CT (possible).  . Pneumonia    hx of-20 yrs ago  . Sinus tachycardia     Past Surgical History:  Procedure Laterality Date  . BACK SURGERY     x  2  . CESAREAN SECTION    . COLONOSCOPY    . LUNG SURGERY Left 2003   saw a spot on her lung , removed it, benign  . TOTAL HIP ARTHROPLASTY Left 03/30/2017   Procedure: LEFT TOTAL HIP ARTHROPLASTY ANTERIOR APPROACH;  Surgeon: Kathryne Hitch, MD;  Location: [facility] OR;  Service: Orthopedics;  Laterality: Left;    There were no vitals filed for this visit.  Subjective Assessment - 04/22/20 0817    Subjective  Pt used TENS 6x over weekend; this helped. Her back is "catching" less and pain is more localized.  The ache is not all the way across her back today.    Currently in Pain?  Yes    Pain Score  1     Pain Location  Back    Pain Orientation  Right;Lower    Pain Descriptors /  Indicators  Aching    Aggravating Factors   twisting, bending over    Pain Relieving Factors  heat, TENS         [facility] PT Assessment - 04/22/20 0001      Assessment   Medical Diagnosis  LBP     Referring Provider (PT)  Dr Marikay Alar     Onset Date/Surgical Date  11/10/19    Hand Dominance  Right    Next MD Visit  11/21    Prior Therapy  no       [facility] Adult PT Treatment/Exercise - 04/22/20 0001      Lumbar Exercises: Stretches   Passive Hamstring Stretch  Right;3 reps;Left;2 reps;30 seconds    Hip Flexor Stretch  Right;Left;2 reps;20 seconds   seated    Piriformis Stretch  Right;Left;20 seconds;2 reps    Other Lumbar Stretch Exercise   seated modified childs pose with arms rolling forward on red pball x 10 sec, 2 reps; repeated with Lt lumbar flexion x 2 reps  Other Lumbar Stretch Exercise  open book x 5 reps with yellow band x 3 sec hold each side.       Lumbar Exercises: Aerobic   Tread Mill  1.4 mph x 4.5 min for warm up.       Lumbar Exercises: Standing   Row  Strengthening;Both;10 reps;Theraband    Theraband Level (Row)  Level 2 (Red)    Shoulder Extension  Both;10 reps;Theraband    Theraband Level (Shoulder Extension)  Level 1 (Yellow)    Other Standing Lumbar Exercises  forward step ups on 6" step (slow and controlled) x 10 reps each leg with TA engaged.       Lumbar Exercises: Supine   Bridge  3 seconds;5 reps   TA engaged. Pain free   Bridge with [employer]  5 reps;3 seconds   with TA engaged.      Iontophoresis   Type of Iontophoresis  Dexamethasone    Location  Rt SI joint     Dose  1.0 cc    Time  80 mA stat patch (4 hr wear time)      Manual Therapy   Soft tissue mobilization  IASTM to Rt lumbar and lower thoracic paraspinals/ QL              PT Education - 04/22/20 0900    Education Details  ionto info    Person(s) Educated  Patient    Methods  Explanation;Handout    Comprehension  Verbalized understanding          PT Long  Term Goals - 04/05/20 1431      PT LONG TERM GOAL #1   Title  Decrease LBP by 75-80% allowing patient to return to prior level of function    Time  6    Period  Weeks    Status  New    Target Date  05/17/20      PT LONG TERM GOAL #2   Title  Increase AROM through lumbar spine and bilat hips to WFL's with minimal discomfort and no pain    Time  6    Period  Weeks    Status  New    Target Date  05/17/20      PT LONG TERM GOAL #3   Title  Patient reports ability to walk for 20-30 min without increase in pain    Time  6    Period  Weeks    Status  New    Target Date  05/17/20      PT LONG TERM GOAL #4   Title  Independent in HEP    Baseline  -    Time  6    Period  Weeks    Status  New    Target Date  05/17/20      PT LONG TERM GOAL #5   Title  Improve FOTO to </= 39% limitation    Time  6    Period  Weeks    Status  New    Target Date  05/17/20            Plan - 04/22/20 0900    Clinical Impression Statement  Persistent tightness/tenderness in Rt lower thoracic and lumbar musculature.  Trial of ionto to Rt SI joint where pain remains constant.  Pt tolerated exercises well without increase in pain.  PRogressing towards goals with localization of pain.    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 hand, back surgeries (3), left THA, lung  surgery, HTN, CAD, anxiety    Stability/Clinical Decision Making  Stable/Uncomplicated    Rehab Potential  Good    PT Frequency  2x / week    PT Duration  6 weeks    PT Treatment/Interventions  ADLs/Self Care Home Management;Cryotherapy;Electrical Stimulation;Iontophoresis 4mg /ml Dexamethasone;Moist Heat;Ultrasound;Therapeutic exercise;Neuromuscular re-education;Patient/family education;Manual techniques;Passive range of motion;Dry needling;Taping;Joint Manipulations;Spinal Manipulations    PT Next Visit Plan  assess response to ionto, IASTM, continue to progress HEP with stretching and core stabilization    PT Home Exercise Plan  [id number]     Consulted and Agree with Plan of Care  Patient       Patient will benefit from skilled therapeutic intervention in order to improve the following deficits and impairments:  Decreased range of motion, Decreased strength, Impaired flexibility, Impaired UE functional use, Pain  Visit Diagnosis: Acute bilateral low back pain without sciatica  Weakness generalized  Joint stiffness of spine  Other symptoms and signs involving the musculoskeletal system     Problem List Patient Active Problem List   Diagnosis Date Noted  . Unilateral primary osteoarthritis, left hip 03/30/2017  . Status post total replacement of left hip 03/30/2017  . S/P lumbar spinal fusion 10/11/2015  . Sinus tachycardia 09/04/2014  . Hyperlipidemia    11/04/2014, PTA 04/22/20 9:09 AM  [facility] [address] [place] [address] [place], [place], [place] Phone: [phone]   Fax:  [phone]  Name: SKYLEY GRANDMAISON MRN: Richardo Hanks Date of Birth: 07-04-1956

## 2020-04-22 NOTE — Patient Instructions (Signed)

## 2020-04-24 ENCOUNTER — Ambulatory Visit (INDEPENDENT_AMBULATORY_CARE_PROVIDER_SITE_OTHER): Payer: BC Managed Care – PPO | Admitting: Physical Therapy

## 2020-04-24 ENCOUNTER — Other Ambulatory Visit: Payer: Self-pay

## 2020-04-24 DIAGNOSIS — R531 Weakness: Secondary | ICD-10-CM | POA: Diagnosis not present

## 2020-04-24 DIAGNOSIS — M545 Low back pain, unspecified: Secondary | ICD-10-CM

## 2020-04-24 DIAGNOSIS — R29898 Other symptoms and signs involving the musculoskeletal system: Secondary | ICD-10-CM | POA: Diagnosis not present

## 2020-04-24 DIAGNOSIS — M2560 Stiffness of unspecified joint, not elsewhere classified: Secondary | ICD-10-CM

## 2020-04-24 NOTE — Therapy (Signed)
[place], [place], [address] Phone: [phone]   Fax:  [phone]  Physical Therapy Treatment  Patient Details  Name: Brandy Meyer MRN: [id number] Date of Birth: March 15, 1956 Referring Provider (PT): Dr Sherley Bounds    Encounter Date: 04/24/2020  PT End of Session - 04/24/20 0909    Visit Number  7    Number of Visits  12    Date for PT Re-Evaluation  05/17/20    PT Start Time  0803    PT Stop Time  0850    PT Time Calculation (min)  47 min    Activity Tolerance  Patient tolerated treatment well    Behavior During Therapy  [facility] for tasks assessed/performed       Past Medical History:  Diagnosis Date  . Anxiety   . Arthritis   . CAD in native artery    a. by coronary CT - medical therapy.  Marland Kitchen History of bronchitis    15 yrs ago  . Hyperlipidemia    takes Atorvastatin daily  . Hypertension    takes Metoprolol daily  . Joint pain   . Nocturia   . Osteopenia    by DEXA scan at [facility] on January 30, 2009  . PFO (patent foramen ovale)    a. small PFO by coronary CT (possible).  . Pneumonia    hx of-20 yrs ago  . Sinus tachycardia     Past Surgical History:  Procedure Laterality Date  . BACK SURGERY     x  2  . CESAREAN SECTION    . COLONOSCOPY    . LUNG SURGERY Left 2003   saw a spot on her lung , removed it, benign  . TOTAL HIP ARTHROPLASTY Left 03/30/2017   Procedure: LEFT TOTAL HIP ARTHROPLASTY ANTERIOR APPROACH;  Surgeon: Mcarthur Rossetti, MD;  Location: [place];  Service: Orthopedics;  Laterality: Left;    There were no vitals filed for this visit.  Subjective Assessment - 04/24/20 0904    Subjective  Pt reports her back hurt worse the rest of the day following last session, "maybe it was just a coincidence".  She reports daily compliance with HEP.    Pertinent History  OA in left hand, back surgeries (3), left THA, lung surgery, HTN, CAD, anxiety    Currently in Pain?  Yes     Pain Score  1     Pain Location  Back    Pain Orientation  Right;Lower    Pain Descriptors / Indicators  Aching;Dull         [facility] PT Assessment - 04/24/20 0001      Assessment   Medical Diagnosis  LBP     Referring Provider (PT)  Dr Sherley Bounds     Onset Date/Surgical Date  11/10/19    Hand Dominance  Right    Next MD Visit  11/21    Prior Therapy  no      Palpation   Palpation comment  Rt ASIS lower than Lt, Rt PSIS higher than Lt, Rt sacral torsion, Rt tight and point tender QL       [facility] Adult PT Treatment/Exercise - 04/24/20 0001      Self-Care   Self-Care  Other Self-Care Comments    Other Self-Care Comments   Pt educated on MFR and STM with ball (psoas in prone, QL in standing).  Pt returned demo with cues.       Lumbar  Exercises: Stretches   Hip Flexor Stretch  Right;1 rep;30 seconds   seated, arm overhead   Prone on Elbows Stretch  1 rep;2 reps   5 sec hold   Quad Stretch  Right;2 reps;30 seconds   prone with strap     Lumbar Exercises: Supine   Bridge with Ball Squeeze  5 reps;3 seconds   with TA engaged.      Moist Heat Therapy   Number Minutes Moist Heat  10 Minutes    Moist Heat Location  Lumbar Spine      Electrical Stimulation   Electrical Stimulation Location  Rt lumbar paraspinals    Electrical Stimulation Action  IFC    Electrical Stimulation Parameters  10 min, intensity to tolerance    Electrical Stimulation Goals  Pain;Tone      Iontophoresis   Type of Iontophoresis  Dexamethasone    Location  Rt SI joint     Dose  1.0 cc    Time  120 mA patch, 12 hr wear time      Manual Therapy   Manual Therapy  Muscle Energy Technique    Muscle Energy Technique  MET to correct shortened Rt QL (Rt sidelying); to correct Rt sacral torsion with pt in prone; to correct ant rotated Rt inominate in hooklying.                   PT Long Term Goals - 04/05/20 1431      PT LONG TERM GOAL #1   Title  Decrease LBP by 75-80% allowing patient  to return to prior level of function    Time  6    Period  Weeks    Status  New    Target Date  05/17/20      PT LONG TERM GOAL #2   Title  Increase AROM through lumbar spine and bilat hips to WFL's with minimal discomfort and no pain    Time  6    Period  Weeks    Status  New    Target Date  05/17/20      PT LONG TERM GOAL #3   Title  Patient reports ability to walk for 20-30 min without increase in pain    Time  6    Period  Weeks    Status  New    Target Date  05/17/20      PT LONG TERM GOAL #4   Title  Independent in HEP    Baseline  -    Time  6    Period  Weeks    Status  New    Target Date  05/17/20      PT LONG TERM GOAL #5   Title  Improve FOTO to </= 39% limitation    Time  6    Period  Weeks    Status  New    Target Date  05/17/20            Plan - 04/24/20 0906    Clinical Impression Statement  Pt presents with pelvis asymmetries; Rt inominate appears ant rotated along with Rt sacral torsion.  MET corrections performed with some improvement of alignment.  continued tightness and tenderness in Rt QL and multifidi; pt also tender in Rt iliopsoas.  Pt responded better with self ball release than hands-on release from PTA; pt given education on home use of ball.  Pt reported reduction of pain by end of session.    Comorbidities  OA  in left hand, back surgeries (3), left THA, lung surgery, HTN, CAD, anxiety    Stability/Clinical Decision Making  Stable/Uncomplicated    Rehab Potential  Good    PT Frequency  2x / week    PT Duration  6 weeks    PT Treatment/Interventions  ADLs/Self Care Home Management;Cryotherapy;Electrical Stimulation;Iontophoresis 61m/ml Dexamethasone;Moist Heat;Ultrasound;Therapeutic exercise;Neuromuscular re-education;Patient/family education;Manual techniques;Passive range of motion;Dry needling;Taping;Joint Manipulations;Spinal Manipulations    PT Next Visit Plan  assess response to ionto, IASTM, continue to progress HEP with stretching  and core stabilization    PT Home Exercise Plan  [id number]    Consulted and Agree with Plan of Care  Patient       Patient will benefit from skilled therapeutic intervention in order to improve the following deficits and impairments:  Decreased range of motion, Decreased strength, Impaired flexibility, Impaired UE functional use, Pain  Visit Diagnosis: Acute bilateral low back pain without sciatica  Weakness generalized  Joint stiffness of spine  Other symptoms and signs involving the musculoskeletal system     Problem List Patient Active Problem List   Diagnosis Date Noted  . Unilateral primary osteoarthritis, left hip 03/30/2017  . Status post total replacement of left hip 03/30/2017  . S/P lumbar spinal fusion 10/11/2015  . Sinus tachycardia 09/04/2014  . Hyperlipidemia    JKerin Perna PTA 04/24/20 9:10 AM  [facility] [address]Phone: [phone]  Fax:  [phone] Name: LNAFISA OLDSMRN: [id number] of Birth: [birth year]

## 2020-04-30 ENCOUNTER — Other Ambulatory Visit: Payer: Self-pay

## 2020-04-30 ENCOUNTER — Ambulatory Visit (INDEPENDENT_AMBULATORY_CARE_PROVIDER_SITE_OTHER): Payer: BC Managed Care – PPO | Admitting: Physical Therapy

## 2020-04-30 DIAGNOSIS — R531 Weakness: Secondary | ICD-10-CM

## 2020-04-30 DIAGNOSIS — M2560 Stiffness of unspecified joint, not elsewhere classified: Secondary | ICD-10-CM

## 2020-04-30 DIAGNOSIS — M545 Low back pain, unspecified: Secondary | ICD-10-CM

## 2020-04-30 DIAGNOSIS — R29898 Other symptoms and signs involving the musculoskeletal system: Secondary | ICD-10-CM

## 2020-04-30 NOTE — Therapy (Signed)
[place], [place], [address] Phone: [phone]   Fax:  [phone]  Physical Therapy Treatment  Patient Details  Name: Brandy Meyer MRN: [id number] Date of Birth: 08-02-1956 Referring Provider (PT): Dr Sherley Bounds    Encounter Date: 04/30/2020  PT End of Session - 04/30/20 0805    Visit Number  8    Number of Visits  12    Date for PT Re-Evaluation  05/17/20    PT Start Time  0803    PT Stop Time  0841    PT Time Calculation (min)  38 min    Activity Tolerance  Patient tolerated treatment well    Behavior During Therapy  [facility] for tasks assessed/performed       Past Medical History:  Diagnosis Date  . Anxiety   . Arthritis   . CAD in native artery    a. by coronary CT - medical therapy.  Marland Kitchen History of bronchitis    15 yrs ago  . Hyperlipidemia    takes Atorvastatin daily  . Hypertension    takes Metoprolol daily  . Joint pain   . Nocturia   . Osteopenia    by D[facility]A scan at [facility] on January 30, 2009  . PFO (patent foramen ovale)    a. small PFO by coronary CT (possible).  . Pneumonia    hx of-20 yrs ago  . Sinus tachycardia     Past Surgical History:  Procedure Laterality Date  . BACK SURGERY     x  2  . CESAREAN SECTION    . COLONOSCOPY    . LUNG SURGERY Left 2003   saw a spot on her lung , removed it, benign  . TOTAL HIP ARTHROPLASTY Left 03/30/2017   Procedure: LEFT TOTAL HIP ARTHROPLASTY ANTERIOR APPROACH;  Surgeon: Mcarthur Rossetti, MD;  Location: [place];  Service: Orthopedics;  Laterality: Left;    There were no vitals filed for this visit.  Subjective Assessment - 04/30/20 0805    Subjective  Pt reports she is feeling pretty good over last few days.  She was on vacation and relaxed for last 4 days.  She reports she has used the ball, massaging her back and Rt hip.    Pertinent History  OA in left hand, back surgeries (3), left THA, lung surgery, HTN, CAD, anxiety     Patient Stated Goals  get rid of back pain    Currently in Pain?  No/denies    Pain Score  0-No pain         [facility] PT Assessment - 04/30/20 0001      Assessment   Medical Diagnosis  LBP     Referring Provider (PT)  Dr Sherley Bounds     Onset Date/Surgical Date  11/10/19    Hand Dominance  Right    Next MD Visit  11/21    Prior Therapy  no       [facility] Adult PT Treatment/Exercise - 04/30/20 0001      Lumbar Exercises: Stretches   Hip Flexor Stretch  Right;Left;2 reps;30 seconds   seated   Prone on Elbows Stretch  1 rep;2 reps   5 sec hold   Quad Stretch  Right;Left;2 reps;30 seconds   prone with strap   Piriformis Stretch  Right;Left;1 rep;30 seconds   supine, knee towards opp shoulder   Other Lumbar Stretch Exercise  lat stretch with arms on counter, walking legs back x  15 sec x 2 reps    Other Lumbar Stretch Exercise  open book x 5 reps with yellow band x 3 sec hold each side.       Lumbar Exercises: Aerobic   Nustep  L4: (seat at #4) 5 min, legs       Lumbar Exercises: Supine   Bridge Limitations  cramping in Lt hamstring with 2 reps; stopped and stretch    Bridge with [employer]  3 seconds;10 reps   with TA engaged.    Bridge with [employer] Limitations  continued cramping; mild and tolerable      Lumbar Exercises: Prone   Opposite Arm/Leg Raise  Right arm/Left leg;Left arm/Right leg;5 reps;2 seconds    Other Prone Lumbar Exercises  review of self release with ball to Rt psoas with pt in prone/ POE x 5 min                   PT Long Term Goals - 04/30/20 4580      PT LONG TERM GOAL #1   Title  Decrease LBP by 75-80% allowing patient to return to prior level of function    Baseline  65% improvement reported on 04/30/20    Time  6    Period  Weeks    Status  On-going      PT LONG TERM GOAL #2   Title  Increase AROM through lumbar spine and bilat hips to WFL's with minimal discomfort and no pain    Time  6    Period  Weeks    Status  On-going       PT LONG TERM GOAL #3   Title  Patient reports ability to walk for 20-30 min without increase in pain    Time  6    Period  Weeks    Status  Partially Met      PT LONG TERM GOAL #4   Title  Independent in HEP    Baseline  -    Time  6    Period  Weeks    Status  On-going      PT LONG TERM GOAL #5   Title  Improve FOTO to </= 39% limitation    Time  6    Period  Weeks    Status  On-going            Plan - 04/30/20 9983    Clinical Impression Statement  Pt reporting 65% improvement in low back pain.  She was able to walk 20-30 min on trip last week without increase in pain.  She tolerated all exercises well, without any production of pain, however she had some cramping in Lt hamstring with bridges.  Pt making good progress towards goals.    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 hand, back surgeries (3), left THA, lung surgery, HTN, CAD, anxiety    Stability/Clinical Decision Making  Stable/Uncomplicated    Rehab Potential  Good    PT Frequency  2x / week    PT Duration  6 weeks    PT Treatment/Interventions  ADLs/Self Care Home Management;Cryotherapy;Electrical Stimulation;Iontophoresis 75m/ml Dexamethasone;Moist Heat;Ultrasound;Therapeutic exercise;Neuromuscular re-education;Patient/family education;Manual techniques;Passive range of motion;Dry needling;Taping;Joint Manipulations;Spinal Manipulations    PT Next Visit Plan  continue to progress HEP with stretching and core stabilization. FOTO.    PT Home Exercise Plan  [id number]    Consulted and Agree with Plan of Care  Patient       Patient will benefit from skilled therapeutic  intervention in order to improve the following deficits and impairments:  Decreased range of motion, Decreased strength, Impaired flexibility, Impaired UE functional use, Pain  Visit Diagnosis: Acute bilateral low back pain without sciatica  Weakness generalized  Joint stiffness of spine  Other symptoms and signs involving the musculoskeletal  system     Problem List Patient Active Problem List   Diagnosis Date Noted  . Unilateral primary osteoarthritis, left hip 03/30/2017  . Status post total replacement of left hip 03/30/2017  . S/P lumbar spinal fusion 10/11/2015  . Sinus tachycardia 09/04/2014  . Hyperlipidemia    Kerin Perna, PTA 04/30/20 8:46 AM  [facility] [place], [place], [address] Phone: [phone]   Fax:  [phone]  Name: Brandy Meyer MRN: [id number] Date of Birth: 08/06/1956

## 2020-05-02 ENCOUNTER — Encounter: Payer: BC Managed Care – PPO | Admitting: Physical Therapy

## 2020-05-08 ENCOUNTER — Encounter: Payer: BC Managed Care – PPO | Admitting: Physical Therapy

## 2020-05-10 ENCOUNTER — Encounter: Payer: Self-pay | Admitting: Physical Therapy

## 2020-05-10 ENCOUNTER — Ambulatory Visit (INDEPENDENT_AMBULATORY_CARE_PROVIDER_SITE_OTHER): Payer: BC Managed Care – PPO | Admitting: Physical Therapy

## 2020-05-10 ENCOUNTER — Other Ambulatory Visit: Payer: Self-pay

## 2020-05-10 DIAGNOSIS — R531 Weakness: Secondary | ICD-10-CM

## 2020-05-10 DIAGNOSIS — M545 Low back pain, unspecified: Secondary | ICD-10-CM

## 2020-05-10 DIAGNOSIS — M2560 Stiffness of unspecified joint, not elsewhere classified: Secondary | ICD-10-CM

## 2020-05-10 DIAGNOSIS — R29898 Other symptoms and signs involving the musculoskeletal system: Secondary | ICD-10-CM | POA: Diagnosis not present

## 2020-05-10 NOTE — Therapy (Addendum)
[place], [place], [address] Phone: [phone]   Fax:  [phone]  Physical Therapy Treatment  Patient Details  Name: Brandy Meyer MRN: [id number] Date of Birth: [birth year] Referring Provider (PT): Dr Sherley Bounds    Encounter Date: 05/10/2020   PT End of Session - 05/10/20 0815    Visit Number 9    Number of Visits 12    Date for PT Re-Evaluation 05/17/20    PT Start Time 0804    PT Stop Time 0850    PT Time Calculation (min) 46 min    Activity Tolerance Patient tolerated treatment well;No increased pain    Behavior During Therapy WFL for tasks assessed/performed           Past Medical History:  Diagnosis Date  . Anxiety   . Arthritis   . CAD in native artery    a. by coronary CT - medical therapy.  Marland Kitchen History of bronchitis    15 yrs ago  . Hyperlipidemia    takes Atorvastatin daily  . Hypertension    takes Metoprolol daily  . Joint pain   . Nocturia   . Osteopenia    by D[facility]A scan at [facility] on January 30, 2009  . PFO (patent foramen ovale)    a. small PFO by coronary CT (possible).  . Pneumonia    hx of-20 yrs ago  . Sinus tachycardia     Past Surgical History:  Procedure Laterality Date  . BACK SURGERY     x  2  . CESAREAN SECTION    . COLONOSCOPY    . LUNG SURGERY Left 2003   saw a spot on her lung , removed it, benign  . TOTAL HIP ARTHROPLASTY Left 03/30/2017   Procedure: LEFT TOTAL HIP ARTHROPLASTY ANTERIOR APPROACH;  Surgeon: Mcarthur Rossetti, MD;  Location: [facility];  Service: Orthopedics;  Laterality: Left;    There were no vitals filed for this visit.   Subjective Assessment - 05/10/20 0856    Subjective Pt reports she has had ups and downs of pain depending on what she's done at work.  She verbalized readiness to hold therapy after today.    Pertinent History OA in left hand, back surgeries (3), left THA, lung surgery, HTN, CAD, anxiety    Patient Stated Goals  get rid of back pain    Currently in Pain? Yes    Pain Location Back    Pain Orientation Right;Left;Lower    Pain Descriptors / Indicators Dull    Aggravating Factors  freq bending    Pain Relieving Factors heat, TENS, aspercream              [facility] PT Assessment - 05/10/20 0001      Assessment   Medical Diagnosis LBP     Referring Provider (PT) Dr Sherley Bounds     Onset Date/Surgical Date 11/10/19    Hand Dominance Right    Next MD Visit 11/21    Prior Therapy no      Observation/Other Assessments   Focus on Therapeutic Outcomes (FOTO)  23% limitation              [facility] Adult PT Treatment/Exercise - 05/10/20 0001      Lumbar Exercises: Stretches   Passive Hamstring Stretch Right;Left;3 reps;20 seconds   seated   Hip Flexor Stretch Right;Left;2 reps;30 seconds   seated   Piriformis Stretch Right;Left;2 reps;20 seconds    Other Lumbar  Stretch Exercise lat stretch with arms on counter, walking legs back x 15 sec x 2 reps      Lumbar Exercises: Standing   Row Strengthening;Both;10 reps    Theraband Level (Row) Level 2 (Red)    Shoulder Extension Both;10 reps      Lumbar Exercises: Supine   Bridge 10 reps;3 seconds      Lumbar Exercises: Sidelying   Other Sidelying Lumbar Exercises Open book: yellow x 5 reps, red x 5 reps each side.       Modalities   Modalities Electrical Stimulation;Moist Heat      Moist Heat Therapy   Number Minutes Moist Heat 10 Minutes    Moist Heat Location Lumbar Spine      Electrical Stimulation   Electrical Stimulation Location bilat lumbar paraspinals    Electrical Stimulation Action IFC    Electrical Stimulation Parameters 10 min, intensity to tolerance    Electrical Stimulation Goals Pain          visually/verbally reviewed HEP.  Pt given updated HEP handout. Pt already has bands at home from previous episode.         PT Long Term Goals - 05/10/20 0816      PT LONG TERM GOAL #1   Title Decrease LBP by 75-80% allowing  patient to return to prior level of function    Baseline 80%    Time 6    Period Weeks    Status Achieved      PT LONG TERM GOAL #2   Title Increase AROM through lumbar spine and bilat hips to WFL's with minimal discomfort and no pain    Time 6    Period Weeks    Status Achieved      PT LONG TERM GOAL #3   Title Patient reports ability to walk for 20-30 min without increase in pain    Time 6    Period Weeks    Status Achieved      PT LONG TERM GOAL #4   Title Independent in HEP    Baseline -    Time 6    Period Weeks    Status Achieved      PT LONG TERM GOAL #5   Title Improve FOTO to </= 39% limitation    Time 6    Period Weeks    Status Achieved                 Plan - 05/10/20 6195    Clinical Impression Statement HEP reviewed and slightly modified.  All exercises completed without increase in LBP.  Pt has met all goals and feels pleased with current level of function.  Will hold therapy while she works on [employer]; pt to return if flare up in next 30 days.    Comorbidities OA in left hand, back surgeries (3), left THA, lung surgery, HTN, CAD, anxiety    Stability/Clinical Decision Making Stable/Uncomplicated    Rehab Potential Good    PT Frequency 2x / week    PT Duration 6 weeks    PT Treatment/Interventions ADLs/Self Care Home Management;Cryotherapy;Electrical Stimulation;Iontophoresis 49m/ml Dexamethasone;Moist Heat;Ultrasound;Therapeutic exercise;Neuromuscular re-education;Patient/family education;Manual techniques;Passive range of motion;Dry needling;Taping;Joint Manipulations;Spinal Manipulations    PT Next Visit Plan hold until 7/11; will d/c at that time if pt doesn't return.    PT Home Exercise Plan [id number]    Consulted and Agree with Plan of Care Patient           Patient will benefit from skilled  therapeutic intervention in order to improve the following deficits and impairments:  Decreased range of motion, Decreased strength, Impaired flexibility,  Impaired UE functional use, Pain  Visit Diagnosis: Acute bilateral low back pain without sciatica  Weakness generalized  Joint stiffness of spine  Other symptoms and signs involving the musculoskeletal system     Problem List Patient Active Problem List   Diagnosis Date Noted  . Unilateral primary osteoarthritis, left hip 03/30/2017  . Status post total replacement of left hip 03/30/2017  . S/P lumbar spinal fusion 10/11/2015  . Sinus tachycardia 09/04/2014  . Hyperlipidemia    Kerin Perna, PTA 05/10/20 12:52 PM  [place], [place], [address] Phone: [phone]   Fax:  [phone]  Name: EMOJEAN GERTZ MRN: [id number] Date of Birth: 23-Oct-1956  PHYSICAL THERAPY DISCHARGE SUMMARY  Visits from Start of Care: 9  Current functional level related to goals / functional outcomes: See last progress note for discharge status.    Remaining deficits: Should continue with independent HEP    Education / Equipment: HEP  Plan: Patient agrees to discharge.  Patient goals were met. Patient is being discharged due to meeting the stated rehab goals.  ?????    Celyn P. Helene Kelp PT, MPH 06/13/20 3:29 PM

## 2020-07-04 DIAGNOSIS — M25532 Pain in left wrist: Secondary | ICD-10-CM | POA: Diagnosis not present

## 2020-08-28 ENCOUNTER — Other Ambulatory Visit: Payer: BC Managed Care – PPO

## 2020-09-04 ENCOUNTER — Other Ambulatory Visit: Payer: BC Managed Care – PPO | Admitting: *Deleted

## 2020-09-04 ENCOUNTER — Other Ambulatory Visit: Payer: Self-pay

## 2020-09-04 DIAGNOSIS — E782 Mixed hyperlipidemia: Secondary | ICD-10-CM

## 2020-09-04 LAB — LIPID PANEL
Chol/HDL Ratio: 2.8 ratio (ref 0.0–4.4)
Cholesterol, Total: 122 mg/dL (ref 100–199)
HDL: 44 mg/dL (ref >39)
LDL Chol Calc (NIH): 57 mg/dL (ref 0–99)
Triglycerides: 117 mg/dL (ref 0–149)
VLDL Cholesterol Cal: 21 mg/dL (ref 5–40)

## 2020-09-11 DIAGNOSIS — R944 Abnormal results of kidney function studies: Secondary | ICD-10-CM | POA: Diagnosis not present

## 2020-09-11 DIAGNOSIS — R748 Abnormal levels of other serum enzymes: Secondary | ICD-10-CM | POA: Diagnosis not present

## 2020-09-11 DIAGNOSIS — K219 Gastro-esophageal reflux disease without esophagitis: Secondary | ICD-10-CM | POA: Diagnosis not present

## 2020-09-11 DIAGNOSIS — R1013 Epigastric pain: Secondary | ICD-10-CM | POA: Diagnosis not present

## 2020-09-11 DIAGNOSIS — Z23 Encounter for immunization: Secondary | ICD-10-CM | POA: Diagnosis not present

## 2020-09-13 DIAGNOSIS — N289 Disorder of kidney and ureter, unspecified: Secondary | ICD-10-CM | POA: Diagnosis not present

## 2020-09-26 DIAGNOSIS — I10 Essential (primary) hypertension: Secondary | ICD-10-CM | POA: Diagnosis not present

## 2020-09-26 DIAGNOSIS — Z6825 Body mass index (BMI) 25.0-25.9, adult: Secondary | ICD-10-CM | POA: Diagnosis not present

## 2020-09-26 DIAGNOSIS — G8929 Other chronic pain: Secondary | ICD-10-CM | POA: Diagnosis not present

## 2020-09-26 DIAGNOSIS — M545 Low back pain, unspecified: Secondary | ICD-10-CM | POA: Diagnosis not present

## 2020-09-27 DIAGNOSIS — N289 Disorder of kidney and ureter, unspecified: Secondary | ICD-10-CM | POA: Diagnosis not present

## 2020-09-27 DIAGNOSIS — R748 Abnormal levels of other serum enzymes: Secondary | ICD-10-CM | POA: Diagnosis not present

## 2020-10-07 DIAGNOSIS — M25532 Pain in left wrist: Secondary | ICD-10-CM | POA: Diagnosis not present

## 2020-10-07 DIAGNOSIS — M13832 Other specified arthritis, left wrist: Secondary | ICD-10-CM | POA: Diagnosis not present

## 2020-12-09 ENCOUNTER — Other Ambulatory Visit: Payer: Self-pay | Admitting: Physician Assistant

## 2021-01-06 ENCOUNTER — Other Ambulatory Visit: Payer: Self-pay | Admitting: Physician Assistant

## 2021-01-08 DIAGNOSIS — M13842 Other specified arthritis, left hand: Secondary | ICD-10-CM | POA: Diagnosis not present

## 2021-01-08 DIAGNOSIS — M13832 Other specified arthritis, left wrist: Secondary | ICD-10-CM | POA: Diagnosis not present

## 2021-01-08 DIAGNOSIS — M19032 Primary osteoarthritis, left wrist: Secondary | ICD-10-CM | POA: Diagnosis not present

## 2021-01-17 ENCOUNTER — Ambulatory Visit: Payer: BC Managed Care – PPO | Admitting: Interventional Cardiology

## 2021-01-20 DIAGNOSIS — N1832 Chronic kidney disease, stage 3b: Secondary | ICD-10-CM | POA: Diagnosis not present

## 2021-01-20 DIAGNOSIS — E782 Mixed hyperlipidemia: Secondary | ICD-10-CM | POA: Diagnosis not present

## 2021-01-20 DIAGNOSIS — R7303 Prediabetes: Secondary | ICD-10-CM | POA: Diagnosis not present

## 2021-01-20 DIAGNOSIS — I2510 Atherosclerotic heart disease of native coronary artery without angina pectoris: Secondary | ICD-10-CM | POA: Diagnosis not present

## 2021-01-20 DIAGNOSIS — R252 Cramp and spasm: Secondary | ICD-10-CM | POA: Diagnosis not present

## 2021-01-20 DIAGNOSIS — E559 Vitamin D deficiency, unspecified: Secondary | ICD-10-CM | POA: Diagnosis not present

## 2021-01-20 DIAGNOSIS — Z1211 Encounter for screening for malignant neoplasm of colon: Secondary | ICD-10-CM | POA: Diagnosis not present

## 2021-01-21 ENCOUNTER — Other Ambulatory Visit: Payer: Self-pay | Admitting: Physician Assistant

## 2021-02-03 DIAGNOSIS — E782 Mixed hyperlipidemia: Secondary | ICD-10-CM | POA: Diagnosis not present

## 2021-02-03 DIAGNOSIS — R7303 Prediabetes: Secondary | ICD-10-CM | POA: Diagnosis not present

## 2021-02-03 DIAGNOSIS — N1832 Chronic kidney disease, stage 3b: Secondary | ICD-10-CM | POA: Diagnosis not present

## 2021-02-05 DIAGNOSIS — Z1231 Encounter for screening mammogram for malignant neoplasm of breast: Secondary | ICD-10-CM | POA: Diagnosis not present

## 2021-02-06 ENCOUNTER — Other Ambulatory Visit: Payer: Self-pay

## 2021-02-06 MED ORDER — ROSUVASTATIN CALCIUM 40 MG PO TABS: 40.0000 mg | ORAL_TABLET | Freq: Every day | ORAL | 0 refills | Status: DC

## 2021-02-09 NOTE — Progress Notes (Unsigned)
Cardiology Office Note   Date:  02/10/2021   ID:  Brandy Meyer, DOB 09/02/1956, MRN [id number]  PCP:  Brandy Rainier, MD    No chief complaint on file.  Coronary artery calcification  Wt Readings from Last 3 Encounters:  02/10/21 131 lb (59.4 kg)  11/08/19 132 lb 8 oz (60.1 kg)  01/30/19 134 lb 6.4 oz (61 kg)       History of Present Illness: Brandy Meyer is a 65 y.o. female  with history of CAD coronary CTA 12/2018 with calcium score of 1315 and extensive plaque with nonobstructive by FFR.  Borderline significant FFR of the circumflex 0.87 mid circumflex and 0.80 distal circumflex 0.91 RCA nonsignificant in the LAD.  Aggressive medical management recommended.  Also has hyperlipidemia.  In 01/30/2019, she was doing well without chest pain.  Atorvastatin was increased to 80 mg and zetia added and most recent LDL was at goal. Walks 1 mile/day. Has a fitbit that reminds her to walk.   She has done well since the last visit.  Denies : Chest pain. Dizziness. Leg edema. Nitroglycerin use. Orthopnea. Palpitations. Paroxysmal nocturnal dyspnea. Shortness of breath. Syncope.     She has not been walking as much in the winter.  Attempting a 5K in late 3/22.    No problems with her meds.   She plans to retire in 9/22.     Past Medical History:  Diagnosis Date  . Anxiety   . Arthritis   . CAD in native artery    a. by coronary CT - medical therapy.  Marland Kitchen History of bronchitis    15 yrs ago  . Hyperlipidemia    takes Atorvastatin daily  . Hypertension    takes Metoprolol daily  . Joint pain   . Nocturia   . Osteopenia    by DEXA scan at [facility] on January 30, 2009  . PFO (patent foramen ovale)    a. small PFO by coronary CT (possible).  . Pneumonia    hx of-20 yrs ago  . Sinus tachycardia     Past Surgical History:  Procedure Laterality Date  . BACK SURGERY     x  2  . CESAREAN SECTION    . COLONOSCOPY    . LUNG SURGERY Left 2003   saw a spot on her lung ,  removed it, benign  . TOTAL HIP ARTHROPLASTY Left 03/30/2017   Procedure: LEFT TOTAL HIP ARTHROPLASTY ANTERIOR APPROACH;  Surgeon: Kathryne Hitch, MD;  Location: [facility] OR;  Service: Orthopedics;  Laterality: Left;     Current Outpatient Medications  Medication Sig Dispense Refill  . ezetimibe (ZETIA) 10 MG tablet TAKE ONE TABLET BY MOUTH DAILY 30 tablet 0  . fluticasone (FLONASE) 50 MCG/ACT nasal spray Place 2 sprays into both nostrils every evening.    . metoprolol succinate (TOPROL-XL) 25 MG 24 hr tablet Take 1 tablet (25 mg total) by mouth daily. PLEASE KEEP UPCOMING APPT FOR FURTHER REFILLS. 90 tablet 0  . nitroGLYCERIN (NITROSTAT) 0.4 MG SL tablet Place 1 tablet (0.4 mg total) under the tongue every 5 (five) minutes as needed for chest pain. 25 tablet 3  . rosuvastatin (CRESTOR) 40 MG tablet Take 1 tablet (40 mg total) by mouth daily. Please keep upcoming appt in March 2022 before anymore refills. Thank you Final Attempt 30 tablet 0  . aspirin EC 81 MG tablet Take 1 tablet (81 mg total) by mouth daily. 90 tablet 3   No current  facility-administered medications for this visit.    Allergies:   Shellfish allergy, Effexor [venlafaxine], Other, Shellfish-derived products, Venlafaxine hcl, and Penicillins    Social History:  The patient  reports that she has quit smoking. She has never used smokeless tobacco. She reports current alcohol use. She reports that she does not use drugs.   Family History:  The patient's family history includes Alcoholism in her father; Heart attack in her mother; Lung cancer in her father.    ROS:  Please see the history of present illness.   Otherwise, review of systems are positive for decreased exercise in the winter.   All other systems are reviewed and negative.    PHYSICAL EXAM: VS:  BP 136/88   Pulse 98   Ht 5\' 1"  (1.549 m)   Wt 131 lb (59.4 kg)   SpO2 96%   BMI 24.75 kg/m  , BMI Body mass index is 24.75 kg/m. GEN: Well nourished, well  developed, in no acute distress  HEENT: normal  Neck: no JVD, carotid bruits, or masses Cardiac: RRR; no murmurs, rubs, or gallops,no edema  Respiratory:  clear to auscultation bilaterally, normal work of breathing GI: soft, nontender, nondistended, + BS MS: no deformity or atrophy  Skin: warm and dry, no rash Neuro:  Strength and sensation are intact Psych: euthymic mood, full affect   EKG:   The ekg ordered today demonstrates NSR, no ST changes   Recent Labs: No results found for requested labs within last 8760 hours.   Lipid Panel    Component Value Date/Time   CHOL 122 09/04/2020 0838   TRIG 117 09/04/2020 0838   HDL 44 09/04/2020 0838   CHOLHDL 2.8 09/04/2020 0838   CHOLHDL 4.3 04/15/2016 0841   VLDL 39 (H) 04/15/2016 0841   LDLCALC 57 09/04/2020 0838   LDLDIRECT 127.0 09/05/2014 0851     Other studies Reviewed: Additional studies/ records that were reviewed today with results demonstrating: labs reviewed.   ASSESSMENT AND PLAN:  1. CAD: High calcium score.  No angina.  Whole food, plant based diet. 2. Hyperlipidemia: The current medical regimen is effective;  continue present plan and medications.  High fiber diet.  90 day refills. 3. HTN: The current medical regimen is effective;  continue present plan and medications.   4. Drink plenty of water to help reduce leg cramps.    Current medicines are reviewed at length with the patient today.  The patient concerns regarding her medicines were addressed.  The following changes have been made:  No change  Labs/ tests ordered today include:  No orders of the defined types were placed in this encounter.   Recommend 150 minutes/week of aerobic exercise Low fat, low carb, high fiber diet recommended  Disposition:   FU in 1 year   Signed, 11/05/2014, MD  02/10/2021 9:07 AM    [facility] [address] [place], [place], [place]  [place] Phone: [phone]; Fax: [phone]

## 2021-02-10 ENCOUNTER — Ambulatory Visit: Payer: BC Managed Care – PPO | Admitting: Interventional Cardiology

## 2021-02-10 ENCOUNTER — Other Ambulatory Visit: Payer: Self-pay

## 2021-02-10 ENCOUNTER — Encounter: Payer: Self-pay | Admitting: Interventional Cardiology

## 2021-02-10 VITALS — BP 136/88 | HR 98 | Ht 61.0 in | Wt 131.0 lb

## 2021-02-10 DIAGNOSIS — E782 Mixed hyperlipidemia: Secondary | ICD-10-CM | POA: Diagnosis not present

## 2021-02-10 DIAGNOSIS — I10 Essential (primary) hypertension: Secondary | ICD-10-CM | POA: Diagnosis not present

## 2021-02-10 DIAGNOSIS — I2510 Atherosclerotic heart disease of native coronary artery without angina pectoris: Secondary | ICD-10-CM | POA: Diagnosis not present

## 2021-02-10 MED ORDER — EZETIMIBE 10 MG PO TABS: 10.0000 mg | ORAL_TABLET | Freq: Every day | ORAL | 3 refills | Status: DC

## 2021-02-10 MED ORDER — NITROGLYCERIN 0.4 MG SL SUBL: 0.4000 mg | SUBLINGUAL_TABLET | SUBLINGUAL | 6 refills | Status: DC | PRN

## 2021-02-10 MED ORDER — METOPROLOL SUCCINATE ER 25 MG PO TB24: 25.0000 mg | ORAL_TABLET | Freq: Every day | ORAL | 3 refills | Status: DC

## 2021-02-10 MED ORDER — ROSUVASTATIN CALCIUM 40 MG PO TABS: 40.0000 mg | ORAL_TABLET | Freq: Every day | ORAL | 3 refills | Status: DC

## 2021-02-10 NOTE — Addendum Note (Signed)
Addended by: Dossie Arbour on: 02/10/2021 09:34 AM   Modules accepted: Orders

## 2021-02-10 NOTE — Patient Instructions (Signed)
Medication Instructions:  Your physician recommends that you continue on your current medications as directed. Please refer to the Current Medication list given to you today.  *If you need a refill on your cardiac medications before your next appointment, please call your pharmacy*   Lab Work: none If you have labs (blood work) drawn today and your tests are completely normal, you will receive your results only by: . MyChart Message (if you have MyChart) OR . A paper copy in the mail If you have any lab test that is abnormal or we need to change your treatment, we will call you to review the results.   Testing/Procedures: none   Follow-Up: At [facility], you and your health needs are our priority.  As part of our continuing mission to provide you with exceptional heart care, we have created designated Provider Care Teams.  These Care Teams include your primary Cardiologist (physician) and Advanced Practice Providers (APPs -  Physician Assistants and Nurse Practitioners) who all work together to provide you with the care you need, when you need it.  We recommend signing up for the patient portal called "MyChart".  Sign up information is provided on this After Visit Summary.  MyChart is used to connect with patients for Virtual Visits (Telemedicine).  Patients are able to view lab/test results, encounter notes, upcoming appointments, etc.  Non-urgent messages can be sent to your provider as well.   To learn more about what you can do with MyChart, go to [email or web address].    Your next appointment:   12 month(s)  The format for your next appointment:   In Person  Provider:   You may see Jayadeep Varanasi, MD or one of the following Advanced Practice Providers on your designated Care Team:    Dayna Dunn, PA-C  Michele Lenze, PA-C    Other Instructions  High-Fiber Eating Plan Fiber, also called dietary fiber, is a type of carbohydrate. It is found foods such as fruits,  vegetables, whole grains, and beans. A high-fiber diet can have many health benefits. Your health care provider may recommend a high-fiber diet to help:  Prevent constipation. Fiber can make your bowel movements more regular.  Lower your cholesterol.  Relieve the following conditions: ? Inflammation of veins in the anus (hemorrhoids). ? Inflammation of specific areas of the digestive tract (uncomplicated diverticulosis). ? A problem of the large intestine, also called the colon, that sometimes causes pain and diarrhea (irritable bowel syndrome, or IBS).  Prevent overeating as part of a weight-loss plan.  Prevent heart disease, type 2 diabetes, and certain cancers. What are tips for following this plan? Reading food labels  Check the nutrition facts label on food products for the amount of dietary fiber. Choose foods that have 5 grams of fiber or more per serving.  The goals for recommended daily fiber intake include: ? Men (age 50 or younger): 34-38 g. ? Men (over age 50): 28-34 g. ? Women (age 50 or younger): 25-28 g. ? Women (over age 50): 22-25 g. Your daily fiber goal is _____________ g.   Shopping  Choose whole fruits and vegetables instead of processed forms, such as apple juice or applesauce.  Choose a wide variety of high-fiber foods such as avocados, lentils, oats, and kidney beans.  Read the nutrition facts label of the foods you choose. Be aware of foods with added fiber. These foods often have high sugar and sodium amounts per serving. Cooking  Use whole-grain flour for baking and cooking.    Cook with brown rice instead of white rice. Meal planning  Start the day with a breakfast that is high in fiber, such as a cereal that contains 5 g of fiber or more per serving.  Eat breads and cereals that are made with whole-grain flour instead of refined flour or white flour.  Eat brown rice, bulgur wheat, or millet instead of white rice.  Use beans in place of meat in  soups, salads, and pasta dishes.  Be sure that half of the grains you eat each day are whole grains. General information  You can get the recommended daily intake of dietary fiber by: ? Eating a variety of fruits, vegetables, grains, nuts, and beans. ? Taking a fiber supplement if you are not able to take in enough fiber in your diet. It is better to get fiber through food than from a supplement.  Gradually increase how much fiber you consume. If you increase your intake of dietary fiber too quickly, you may have bloating, cramping, or gas.  Drink plenty of water to help you digest fiber.  Choose high-fiber snacks, such as berries, raw vegetables, nuts, and popcorn. What foods should I eat? Fruits Berries. Pears. Apples. Oranges. Avocado. Prunes and raisins. Dried figs. Vegetables Sweet potatoes. Spinach. Kale. Artichokes. Cabbage. Broccoli. Cauliflower. Green peas. Carrots. Squash. Grains Whole-grain breads. Multigrain cereal. Oats and oatmeal. Brown rice. Barley. Bulgur wheat. Millet. Quinoa. Bran muffins. Popcorn. Rye wafer crackers. Meats and other proteins Navy beans, kidney beans, and pinto beans. Soybeans. Split peas. Lentils. Nuts and seeds. Dairy Fiber-fortified yogurt. Beverages Fiber-fortified soy milk. Fiber-fortified orange juice. Other foods Fiber bars. The items listed above may not be a complete list of recommended foods and beverages. Contact a dietitian for more information. What foods should I avoid? Fruits Fruit juice. Cooked, strained fruit. Vegetables Fried potatoes. Canned vegetables. Well-cooked vegetables. Grains White bread. Pasta made with refined flour. White rice. Meats and other proteins Fatty cuts of meat. Fried chicken or fried fish. Dairy Milk. Yogurt. Cream cheese. Sour cream. Fats and oils Butters. Beverages Soft drinks. Other foods Cakes and pastries. The items listed above may not be a complete list of foods and beverages to avoid.  Talk with your dietitian about what choices are best for you. Summary  Fiber is a type of carbohydrate. It is found in foods such as fruits, vegetables, whole grains, and beans.  A high-fiber diet has many benefits. It can help to prevent constipation, lower blood cholesterol, aid weight loss, and reduce your risk of heart disease, diabetes, and certain cancers.  Increase your intake of fiber gradually. Increasing fiber too quickly may cause cramping, bloating, and gas. Drink plenty of water while you increase the amount of fiber you consume.  The best sources of fiber include whole fruits and vegetables, whole grains, nuts, seeds, and beans. This information is not intended to replace advice given to you by your health care provider. Make sure you discuss any questions you have with your health care provider. Document Revised: 03/21/2020 Document Reviewed: 03/21/2020 Elsevier Patient Education  2021 Elsevier Inc.   

## 2021-02-24 ENCOUNTER — Telehealth: Payer: Self-pay | Admitting: *Deleted

## 2021-02-24 DIAGNOSIS — Z1211 Encounter for screening for malignant neoplasm of colon: Secondary | ICD-10-CM | POA: Diagnosis not present

## 2021-02-24 DIAGNOSIS — K219 Gastro-esophageal reflux disease without esophagitis: Secondary | ICD-10-CM | POA: Diagnosis not present

## 2021-02-24 NOTE — Telephone Encounter (Signed)
   [facility] Pre-operative Risk Assessment    HEARTCARE STAFF: - Please ensure there is not already an duplicate clearance open for this procedure. - Under Visit Info/Reason for Call, type in Other and utilize the format Clearance MM/DD/YY or Clearance TBD. Do not use dashes or single digits. - If request is for dental extraction, please clarify the # of teeth to be extracted.  Request for surgical clearance:  1. What type of surgery is being performed? COLONOSCOPY   2. When is this surgery scheduled? 04/30/21   3. What type of clearance is required (medical clearance vs. Pharmacy clearance to hold med vs. Both)? MEDICAL  4. Are there any medications that need to be held prior to surgery and how long? PT IS ON ASA; THOUGH NOT LISTED AS THE NEED TO BE HELD   5. Practice name and name of physician performing surgery? [facility]; DR. Cristina Gong   6. What is the office phone number? [phone]   7.   What is the office fax number? [phone]  8.   Anesthesia type (None, local, MAC, general) ? NOT LISTED   Julaine Hua 02/24/2021, 5:38 PM  _________________________________________________________________   (provider comments below)

## 2021-02-25 NOTE — Telephone Encounter (Signed)
   Primary Cardiologist: Lance Muss, MD  Chart reviewed as part of pre-operative protocol coverage. Given past medical history and time since last visit, based on ACC/AHA guidelines, Brandy Meyer would be at acceptable risk for the planned procedure without further cardiovascular testing.   She was recently seen by Dr. Eldridge Dace 02/10/21 and was doing very well from a CV standpoint. She is on ASA however has no prior hx of PCI/DES therefore if needed, she may hold ASA 3-5 days prior to procedure and resume as soon as possible thereafter.   I will route this recommendation to the requesting party via Epic fax function and remove from pre-op pool.  Please call with questions.  Georgie Chard, NP 02/25/2021, 8:11 AM

## 2021-03-11 DIAGNOSIS — N1832 Chronic kidney disease, stage 3b: Secondary | ICD-10-CM | POA: Diagnosis not present

## 2021-04-09 DIAGNOSIS — M13842 Other specified arthritis, left hand: Secondary | ICD-10-CM | POA: Diagnosis not present

## 2021-04-09 DIAGNOSIS — M13832 Other specified arthritis, left wrist: Secondary | ICD-10-CM | POA: Diagnosis not present

## 2021-04-09 DIAGNOSIS — M19032 Primary osteoarthritis, left wrist: Secondary | ICD-10-CM | POA: Diagnosis not present

## 2021-04-30 DIAGNOSIS — Z1211 Encounter for screening for malignant neoplasm of colon: Secondary | ICD-10-CM | POA: Diagnosis not present

## 2021-04-30 DIAGNOSIS — D122 Benign neoplasm of ascending colon: Secondary | ICD-10-CM | POA: Diagnosis not present

## 2021-07-15 DIAGNOSIS — L570 Actinic keratosis: Secondary | ICD-10-CM | POA: Diagnosis not present

## 2021-07-16 DIAGNOSIS — M13842 Other specified arthritis, left hand: Secondary | ICD-10-CM | POA: Diagnosis not present

## 2021-07-16 DIAGNOSIS — M13832 Other specified arthritis, left wrist: Secondary | ICD-10-CM | POA: Diagnosis not present

## 2021-07-16 DIAGNOSIS — M25532 Pain in left wrist: Secondary | ICD-10-CM | POA: Diagnosis not present

## 2021-07-16 DIAGNOSIS — M19032 Primary osteoarthritis, left wrist: Secondary | ICD-10-CM | POA: Diagnosis not present
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
## 2022-02-23 ENCOUNTER — Telehealth: Payer: Self-pay | Admitting: *Deleted

## 2022-02-23 ENCOUNTER — Other Ambulatory Visit: Payer: Self-pay

## 2022-02-23 ENCOUNTER — Encounter: Payer: Self-pay | Admitting: Interventional Cardiology

## 2022-02-23 ENCOUNTER — Ambulatory Visit (INDEPENDENT_AMBULATORY_CARE_PROVIDER_SITE_OTHER): Payer: Medicare Other | Admitting: Interventional Cardiology

## 2022-02-23 VITALS — BP 106/62 | HR 97 | Ht 61.0 in | Wt 127.0 lb

## 2022-02-23 DIAGNOSIS — E782 Mixed hyperlipidemia: Secondary | ICD-10-CM | POA: Diagnosis not present

## 2022-02-23 DIAGNOSIS — I2510 Atherosclerotic heart disease of native coronary artery without angina pectoris: Secondary | ICD-10-CM | POA: Diagnosis not present

## 2022-02-23 DIAGNOSIS — I10 Essential (primary) hypertension: Secondary | ICD-10-CM | POA: Diagnosis not present

## 2022-02-23 MED ORDER — NITROGLYCERIN 0.4 MG SL SUBL: 0.4000 mg | SUBLINGUAL_TABLET | SUBLINGUAL | 6 refills | Status: DC | PRN

## 2022-02-23 NOTE — Telephone Encounter (Signed)
Received message from Dr Eldridge Dace that patient is having medicare wellness visit soon and lab work will be done at that visit. Does not need to be ordered now.  ?

## 2022-02-23 NOTE — Telephone Encounter (Signed)
Dr Eldridge Dace would like patient to have fasting lipids, TSH and CMET.  ?I placed call to patient but mailbox was full.  ?

## 2022-02-23 NOTE — Patient Instructions (Signed)

## 2022-03-03 DIAGNOSIS — Z1231 Encounter for screening mammogram for malignant neoplasm of breast: Secondary | ICD-10-CM | POA: Diagnosis not present

## 2022-03-06 ENCOUNTER — Encounter: Payer: Self-pay | Admitting: Interventional Cardiology

## 2022-03-09 ENCOUNTER — Encounter: Payer: Self-pay | Admitting: Interventional Cardiology

## 2022-03-10 ENCOUNTER — Other Ambulatory Visit: Payer: Self-pay | Admitting: *Deleted

## 2022-03-10 MED ORDER — ROSUVASTATIN CALCIUM 40 MG PO TABS: 40.0000 mg | ORAL_TABLET | Freq: Every day | ORAL | 3 refills | Status: DC

## 2022-03-10 MED ORDER — EZETIMIBE 10 MG PO TABS: 10.0000 mg | ORAL_TABLET | Freq: Every day | ORAL | 3 refills | Status: DC

## 2022-03-11 DIAGNOSIS — M13832 Other specified arthritis, left wrist: Secondary | ICD-10-CM | POA: Diagnosis not present

## 2022-03-11 DIAGNOSIS — M25532 Pain in left wrist: Secondary | ICD-10-CM | POA: Diagnosis not present

## 2022-03-11 DIAGNOSIS — M13842 Other specified arthritis, left hand: Secondary | ICD-10-CM | POA: Diagnosis not present

## 2022-03-12 ENCOUNTER — Other Ambulatory Visit: Payer: Self-pay | Admitting: Interventional Cardiology
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
## 2022-03-19 DIAGNOSIS — Z23 Encounter for immunization: Secondary | ICD-10-CM | POA: Diagnosis not present

## 2022-03-19 DIAGNOSIS — R7303 Prediabetes: Secondary | ICD-10-CM | POA: Diagnosis not present

## 2022-03-19 DIAGNOSIS — L659 Nonscarring hair loss, unspecified: Secondary | ICD-10-CM | POA: Diagnosis not present

## 2022-03-19 DIAGNOSIS — I2510 Atherosclerotic heart disease of native coronary artery without angina pectoris: Secondary | ICD-10-CM | POA: Diagnosis not present

## 2022-03-19 DIAGNOSIS — R350 Frequency of micturition: Secondary | ICD-10-CM | POA: Diagnosis not present

## 2022-03-19 DIAGNOSIS — N1832 Chronic kidney disease, stage 3b: Secondary | ICD-10-CM | POA: Diagnosis not present

## 2022-03-19 DIAGNOSIS — Z0000 Encounter for general adult medical examination without abnormal findings: Secondary | ICD-10-CM | POA: Diagnosis not present

## 2022-03-19 DIAGNOSIS — M8588 Other specified disorders of bone density and structure, other site: Secondary | ICD-10-CM | POA: Diagnosis not present

## 2022-03-19 DIAGNOSIS — R809 Proteinuria, unspecified: Secondary | ICD-10-CM | POA: Diagnosis not present

## 2022-03-19 DIAGNOSIS — E559 Vitamin D deficiency, unspecified: Secondary | ICD-10-CM | POA: Diagnosis not present

## 2022-03-19 DIAGNOSIS — E782 Mixed hyperlipidemia: Secondary | ICD-10-CM | POA: Diagnosis not present

## 2022-03-19 DIAGNOSIS — I10 Essential (primary) hypertension: Secondary | ICD-10-CM | POA: Diagnosis not present

## 2022-03-19 DIAGNOSIS — R634 Abnormal weight loss: Secondary | ICD-10-CM | POA: Diagnosis not present

## 2022-03-31 ENCOUNTER — Encounter: Payer: Self-pay | Admitting: Interventional Cardiology

## 2022-03-31 DIAGNOSIS — E782 Mixed hyperlipidemia: Secondary | ICD-10-CM

## 2022-03-31 DIAGNOSIS — E039 Hypothyroidism, unspecified: Secondary | ICD-10-CM

## 2022-04-01 ENCOUNTER — Ambulatory Visit: Payer: BC Managed Care – PPO | Admitting: Interventional Cardiology

## 2022-04-07 DIAGNOSIS — I10 Essential (primary) hypertension: Secondary | ICD-10-CM | POA: Diagnosis not present

## 2022-04-07 DIAGNOSIS — E039 Hypothyroidism, unspecified: Secondary | ICD-10-CM | POA: Diagnosis not present

## 2022-04-23 ENCOUNTER — Other Ambulatory Visit: Payer: Self-pay | Admitting: Interventional Cardiology

## 2022-05-12 ENCOUNTER — Other Ambulatory Visit: Payer: Self-pay

## 2022-05-12 ENCOUNTER — Inpatient Hospital Stay (HOSPITAL_COMMUNITY)
Admission: EM | Admit: 2022-05-12 | Discharge: 2022-05-16 | DRG: 558 | Disposition: A | Discharge status: Home / Self Care | Payer: Medicare Other | Attending: Internal Medicine | Admitting: Internal Medicine

## 2022-05-12 ENCOUNTER — Encounter (HOSPITAL_COMMUNITY): Payer: Self-pay | Admitting: Emergency Medicine

## 2022-05-12 DIAGNOSIS — M6282 Rhabdomyolysis: Principal | ICD-10-CM | POA: Diagnosis present

## 2022-05-12 DIAGNOSIS — Z811 Family history of alcohol abuse and dependence: Secondary | ICD-10-CM

## 2022-05-12 DIAGNOSIS — I10 Essential (primary) hypertension: Secondary | ICD-10-CM | POA: Diagnosis not present

## 2022-05-12 DIAGNOSIS — E785 Hyperlipidemia, unspecified: Secondary | ICD-10-CM | POA: Diagnosis not present

## 2022-05-12 DIAGNOSIS — M8580 Other specified disorders of bone density and structure, unspecified site: Secondary | ICD-10-CM | POA: Diagnosis present

## 2022-05-12 DIAGNOSIS — E8770 Fluid overload, unspecified: Secondary | ICD-10-CM | POA: Diagnosis not present

## 2022-05-12 DIAGNOSIS — E782 Mixed hyperlipidemia: Secondary | ICD-10-CM | POA: Diagnosis not present

## 2022-05-12 DIAGNOSIS — R7401 Elevation of levels of liver transaminase levels: Secondary | ICD-10-CM

## 2022-05-12 DIAGNOSIS — N179 Acute kidney failure, unspecified: Secondary | ICD-10-CM | POA: Diagnosis not present

## 2022-05-12 DIAGNOSIS — I440 Atrioventricular block, first degree: Secondary | ICD-10-CM | POA: Diagnosis not present

## 2022-05-12 DIAGNOSIS — E876 Hypokalemia: Secondary | ICD-10-CM

## 2022-05-12 DIAGNOSIS — I2510 Atherosclerotic heart disease of native coronary artery without angina pectoris: Secondary | ICD-10-CM | POA: Diagnosis not present

## 2022-05-12 DIAGNOSIS — Z79899 Other long term (current) drug therapy: Secondary | ICD-10-CM

## 2022-05-12 DIAGNOSIS — Z91013 Allergy to seafood: Secondary | ICD-10-CM

## 2022-05-12 DIAGNOSIS — Z6823 Body mass index (BMI) 23.0-23.9, adult: Secondary | ICD-10-CM

## 2022-05-12 DIAGNOSIS — K219 Gastro-esophageal reflux disease without esophagitis: Secondary | ICD-10-CM | POA: Diagnosis not present

## 2022-05-12 DIAGNOSIS — Z6372 Alcoholism and drug addiction in family: Secondary | ICD-10-CM

## 2022-05-12 DIAGNOSIS — E039 Hypothyroidism, unspecified: Secondary | ICD-10-CM

## 2022-05-12 DIAGNOSIS — R9431 Abnormal electrocardiogram [ECG] [EKG]: Secondary | ICD-10-CM | POA: Diagnosis not present

## 2022-05-12 DIAGNOSIS — Z96642 Presence of left artificial hip joint: Secondary | ICD-10-CM | POA: Diagnosis present

## 2022-05-12 DIAGNOSIS — R634 Abnormal weight loss: Secondary | ICD-10-CM | POA: Diagnosis not present

## 2022-05-12 DIAGNOSIS — Z801 Family history of malignant neoplasm of trachea, bronchus and lung: Secondary | ICD-10-CM

## 2022-05-12 DIAGNOSIS — Q2112 Patent foramen ovale: Secondary | ICD-10-CM | POA: Diagnosis not present

## 2022-05-12 DIAGNOSIS — M1990 Unspecified osteoarthritis, unspecified site: Secondary | ICD-10-CM | POA: Diagnosis present

## 2022-05-12 DIAGNOSIS — Z8249 Family history of ischemic heart disease and other diseases of the circulatory system: Secondary | ICD-10-CM

## 2022-05-12 DIAGNOSIS — R748 Abnormal levels of other serum enzymes: Secondary | ICD-10-CM | POA: Diagnosis not present

## 2022-05-12 DIAGNOSIS — Z888 Allergy status to other drugs, medicaments and biological substances status: Secondary | ICD-10-CM | POA: Diagnosis not present

## 2022-05-12 DIAGNOSIS — Z7982 Long term (current) use of aspirin: Secondary | ICD-10-CM | POA: Diagnosis not present

## 2022-05-12 DIAGNOSIS — M791 Myalgia, unspecified site: Secondary | ICD-10-CM | POA: Diagnosis not present

## 2022-05-12 DIAGNOSIS — D72829 Elevated white blood cell count, unspecified: Secondary | ICD-10-CM | POA: Diagnosis not present

## 2022-05-12 DIAGNOSIS — M8588 Other specified disorders of bone density and structure, other site: Secondary | ICD-10-CM | POA: Diagnosis not present

## 2022-05-12 DIAGNOSIS — Z880 Allergy status to penicillin: Secondary | ICD-10-CM

## 2022-05-12 DIAGNOSIS — R630 Anorexia: Secondary | ICD-10-CM | POA: Diagnosis present

## 2022-05-12 DIAGNOSIS — R7303 Prediabetes: Secondary | ICD-10-CM | POA: Diagnosis not present

## 2022-05-12 LAB — DIC (DISSEMINATED INTRAVASCULAR COAGULATION)PANEL
D-Dimer, Quant: 0.86 ug/mL-FEU — ABNORMAL HIGH (ref 0.00–0.50)
Fibrinogen: 384 mg/dL (ref 210–475)
INR: 1.1 (ref 0.8–1.2)
Platelets: 267 10*3/uL (ref 150–400)
Prothrombin Time: 14.2 seconds (ref 11.4–15.2)
Smear Review: NONE SEEN
aPTT: 27 seconds (ref 24–36)

## 2022-05-12 LAB — CBC WITH DIFFERENTIAL/PLATELET
Abs Immature Granulocytes: 0.03 10*3/uL (ref 0.00–0.07)
Basophils Absolute: 0.1 10*3/uL (ref 0.0–0.1)
Basophils Relative: 1 %
Eosinophils Absolute: 0.1 10*3/uL (ref 0.0–0.5)
Eosinophils Relative: 1 %
HCT: 30 % — ABNORMAL LOW (ref 36.0–46.0)
Hemoglobin: 10.3 g/dL — ABNORMAL LOW (ref 12.0–15.0)
Immature Granulocytes: 0 %
Lymphocytes Relative: 58 %
Lymphs Abs: 9 10*3/uL — ABNORMAL HIGH (ref 0.7–4.0)
MCH: 31.3 pg (ref 26.0–34.0)
MCHC: 34.3 g/dL (ref 30.0–36.0)
MCV: 91.2 fL (ref 80.0–100.0)
Monocytes Absolute: 0.8 10*3/uL (ref 0.1–1.0)
Monocytes Relative: 5 %
Neutro Abs: 5.5 10*3/uL (ref 1.7–7.7)
Neutrophils Relative %: 35 %
Platelets: 279 10*3/uL (ref 150–400)
RBC: 3.29 MIL/uL — ABNORMAL LOW (ref 3.87–5.11)
RDW: 13 % (ref 11.5–15.5)
WBC: 15.6 10*3/uL — ABNORMAL HIGH (ref 4.0–10.5)
nRBC: 0 % (ref 0.0–0.2)

## 2022-05-12 LAB — COMPREHENSIVE METABOLIC PANEL
ALT: 227 U/L — ABNORMAL HIGH (ref 0–44)
AST: 180 U/L — ABNORMAL HIGH (ref 15–41)
Albumin: 3.5 g/dL (ref 3.5–5.0)
Alkaline Phosphatase: 51 U/L (ref 38–126)
Anion gap: 11 (ref 5–15)
BUN: 6 mg/dL — ABNORMAL LOW (ref 8–23)
CO2: 27 mmol/L (ref 22–32)
Calcium: 9.2 mg/dL (ref 8.9–10.3)
Chloride: 99 mmol/L (ref 98–111)
Creatinine, Ser: 1.21 mg/dL — ABNORMAL HIGH (ref 0.44–1.00)
GFR, Estimated: 49 mL/min — ABNORMAL LOW (ref >60)
Glucose, Bld: 95 mg/dL (ref 70–99)
Potassium: 3 mmol/L — ABNORMAL LOW (ref 3.5–5.1)
Sodium: 137 mmol/L (ref 135–145)
Total Bilirubin: 1.3 mg/dL — ABNORMAL HIGH (ref 0.3–1.2)
Total Protein: 6.1 g/dL — ABNORMAL LOW (ref 6.5–8.1)

## 2022-05-12 LAB — CK TOTAL AND CKMB (NOT AT ARMC)
CK, MB: 64.7 ng/mL — ABNORMAL HIGH (ref 0.5–5.0)
Relative Index: 1.3 (ref 0.0–2.5)
Total CK: 5101 U/L — ABNORMAL HIGH (ref 38–234)

## 2022-05-12 LAB — URINALYSIS, ROUTINE W REFLEX MICROSCOPIC
Bacteria, UA: NONE SEEN
Bilirubin Urine: NEGATIVE
Glucose, UA: NEGATIVE mg/dL
Ketones, ur: NEGATIVE mg/dL
Leukocytes,Ua: NEGATIVE
Nitrite: NEGATIVE
Protein, ur: NEGATIVE mg/dL
Specific Gravity, Urine: 1.002 — ABNORMAL LOW (ref 1.005–1.030)
pH: 6 (ref 5.0–8.0)

## 2022-05-12 LAB — I-STAT VENOUS BLOOD GAS, ED
Acid-Base Excess: 6 mmol/L — ABNORMAL HIGH (ref 0.0–2.0)
Bicarbonate: 29.1 mmol/L — ABNORMAL HIGH (ref 20.0–28.0)
Calcium, Ion: 1.05 mmol/L — ABNORMAL LOW (ref 1.15–1.40)
HCT: 29 % — ABNORMAL LOW (ref 36.0–46.0)
Hemoglobin: 9.9 g/dL — ABNORMAL LOW (ref 12.0–15.0)
O2 Saturation: 100 %
Potassium: 3 mmol/L — ABNORMAL LOW (ref 3.5–5.1)
Sodium: 135 mmol/L (ref 135–145)
TCO2: 30 mmol/L (ref 22–32)
pCO2, Ven: 37 mmHg — ABNORMAL LOW (ref 44–60)
pH, Ven: 7.504 — ABNORMAL HIGH (ref 7.25–7.43)
pO2, Ven: 203 mmHg — ABNORMAL HIGH (ref 32–45)

## 2022-05-12 LAB — URIC ACID: Uric Acid, Serum: 3.9 mg/dL (ref 2.5–7.1)

## 2022-05-12 LAB — PHOSPHORUS: Phosphorus: 3.2 mg/dL (ref 2.5–4.6)

## 2022-05-12 LAB — MAGNESIUM: Magnesium: 2 mg/dL (ref 1.7–2.4)

## 2022-05-12 NOTE — ED Triage Notes (Signed)
Per patient recently started on synthroid, however, was told to stop her cholesterol medication due to the lab work.

## 2022-05-12 NOTE — ED Provider Triage Note (Signed)
Emergency Medicine Provider Triage Evaluation Note  Brandy Meyer , a 66 y.o. female  was evaluated in triage.  Pt complains of elevated CK.  Reports that 1 to 2 weeks ago she started taking Synthroid.  Around then she started to have some muscle aches.  She then saw her primary about this and he drew a CK.  Today it came back in the 5000s.  He told her to stop her statin although she has been on this for 2 to 3 years.  She is also stopped her Synthroid and presented directly to the emergency department.  Review of Systems  Positive: Body aches Negative: Specific chest pain  Physical Exam  BP (!) 146/73 (BP Location: Right Arm)   Pulse (!) 109   Temp 98.8 F (37.1 C) (Oral)   Resp (!) 22   Ht 5\' 1"  (1.549 m)   Wt 57 kg   SpO2 97%   BMI 23.74 kg/m  Gen:   Awake, no distress   Resp:  Normal effort  MSK:   Moves extremities without difficulty  Other:  Well-appearing, very anxious  Medical Decision Making  Medically screening exam initiated at 6:25 PM.  Appropriate orders placed.  ANGELLE ISAIS was informed that the remainder of the evaluation will be completed by another provider, this initial triage assessment does not replace that evaluation, and the importance of remaining in the ED until their evaluation is complete.    Tachycardia likely secondary to anxiety and tears   Kourtni Stineman A, PA-C 05/12/22 1826

## 2022-05-12 NOTE — ED Triage Notes (Signed)
Patient sent by PCP at [facility] for elevated CK of 5499. Patient complaining sore thighs, back, shoulders bilaterally for about 2 weeks since starting a new thyroid medicine. Pt denies any issues with urination, notes normal colors. Denies chest pain, SHOB, nvd. Aox4.

## 2022-05-13 DIAGNOSIS — I10 Essential (primary) hypertension: Secondary | ICD-10-CM | POA: Diagnosis not present

## 2022-05-13 DIAGNOSIS — E8770 Fluid overload, unspecified: Secondary | ICD-10-CM | POA: Diagnosis not present

## 2022-05-13 DIAGNOSIS — Z7982 Long term (current) use of aspirin: Secondary | ICD-10-CM | POA: Diagnosis not present

## 2022-05-13 DIAGNOSIS — D72829 Elevated white blood cell count, unspecified: Secondary | ICD-10-CM | POA: Diagnosis not present

## 2022-05-13 DIAGNOSIS — Z96642 Presence of left artificial hip joint: Secondary | ICD-10-CM | POA: Diagnosis present

## 2022-05-13 DIAGNOSIS — E785 Hyperlipidemia, unspecified: Secondary | ICD-10-CM | POA: Diagnosis not present

## 2022-05-13 DIAGNOSIS — M8580 Other specified disorders of bone density and structure, unspecified site: Secondary | ICD-10-CM | POA: Diagnosis present

## 2022-05-13 DIAGNOSIS — K219 Gastro-esophageal reflux disease without esophagitis: Secondary | ICD-10-CM | POA: Diagnosis present

## 2022-05-13 DIAGNOSIS — I440 Atrioventricular block, first degree: Secondary | ICD-10-CM

## 2022-05-13 DIAGNOSIS — E039 Hypothyroidism, unspecified: Secondary | ICD-10-CM

## 2022-05-13 DIAGNOSIS — N179 Acute kidney failure, unspecified: Secondary | ICD-10-CM

## 2022-05-13 DIAGNOSIS — R7401 Elevation of levels of liver transaminase levels: Secondary | ICD-10-CM

## 2022-05-13 DIAGNOSIS — Z79899 Other long term (current) drug therapy: Secondary | ICD-10-CM | POA: Diagnosis not present

## 2022-05-13 DIAGNOSIS — M1990 Unspecified osteoarthritis, unspecified site: Secondary | ICD-10-CM | POA: Diagnosis present

## 2022-05-13 DIAGNOSIS — Z880 Allergy status to penicillin: Secondary | ICD-10-CM | POA: Diagnosis not present

## 2022-05-13 DIAGNOSIS — R630 Anorexia: Secondary | ICD-10-CM | POA: Diagnosis present

## 2022-05-13 DIAGNOSIS — Z888 Allergy status to other drugs, medicaments and biological substances status: Secondary | ICD-10-CM | POA: Diagnosis not present

## 2022-05-13 DIAGNOSIS — M6282 Rhabdomyolysis: Secondary | ICD-10-CM

## 2022-05-13 DIAGNOSIS — I2510 Atherosclerotic heart disease of native coronary artery without angina pectoris: Secondary | ICD-10-CM | POA: Diagnosis present

## 2022-05-13 DIAGNOSIS — Z6823 Body mass index (BMI) 23.0-23.9, adult: Secondary | ICD-10-CM | POA: Diagnosis not present

## 2022-05-13 DIAGNOSIS — Q2112 Patent foramen ovale: Secondary | ICD-10-CM | POA: Diagnosis not present

## 2022-05-13 DIAGNOSIS — E876 Hypokalemia: Secondary | ICD-10-CM | POA: Diagnosis not present

## 2022-05-13 DIAGNOSIS — R634 Abnormal weight loss: Secondary | ICD-10-CM | POA: Diagnosis present

## 2022-05-13 LAB — PATHOLOGIST SMEAR REVIEW

## 2022-05-13 LAB — COMPREHENSIVE METABOLIC PANEL
ALT: 186 U/L — ABNORMAL HIGH (ref 0–44)
AST: 118 U/L — ABNORMAL HIGH (ref 15–41)
Albumin: 3.3 g/dL — ABNORMAL LOW (ref 3.5–5.0)
Alkaline Phosphatase: 43 U/L (ref 38–126)
Anion gap: 10 (ref 5–15)
BUN: 8 mg/dL (ref 8–23)
CO2: 25 mmol/L (ref 22–32)
Calcium: 8.4 mg/dL — ABNORMAL LOW (ref 8.9–10.3)
Chloride: 104 mmol/L (ref 98–111)
Creatinine, Ser: 1.13 mg/dL — ABNORMAL HIGH (ref 0.44–1.00)
GFR, Estimated: 54 mL/min — ABNORMAL LOW (ref >60)
Glucose, Bld: 96 mg/dL (ref 70–99)
Potassium: 2.9 mmol/L — ABNORMAL LOW (ref 3.5–5.1)
Sodium: 139 mmol/L (ref 135–145)
Total Bilirubin: 1.5 mg/dL — ABNORMAL HIGH (ref 0.3–1.2)
Total Protein: 5.5 g/dL — ABNORMAL LOW (ref 6.5–8.1)

## 2022-05-13 LAB — CBC
HCT: 28.7 % — ABNORMAL LOW (ref 36.0–46.0)
Hemoglobin: 9.5 g/dL — ABNORMAL LOW (ref 12.0–15.0)
MCH: 30.5 pg (ref 26.0–34.0)
MCHC: 33.1 g/dL (ref 30.0–36.0)
MCV: 92.3 fL (ref 80.0–100.0)
Platelets: 252 10*3/uL (ref 150–400)
RBC: 3.11 MIL/uL — ABNORMAL LOW (ref 3.87–5.11)
RDW: 13.2 % (ref 11.5–15.5)
WBC: 13.9 10*3/uL — ABNORMAL HIGH (ref 4.0–10.5)
nRBC: 0 % (ref 0.0–0.2)

## 2022-05-13 LAB — TSH: TSH: 3.398 u[IU]/mL (ref 0.350–4.500)

## 2022-05-13 LAB — CK: Total CK: 3867 U/L — ABNORMAL HIGH (ref 38–234)

## 2022-05-13 LAB — HIV ANTIBODY (ROUTINE TESTING W REFLEX): HIV Screen 4th Generation wRfx: NONREACTIVE

## 2022-05-13 MED ORDER — EZETIMIBE 10 MG PO TABS
10.0000 mg | ORAL_TABLET | Freq: Every evening | ORAL | Status: DC
  Administered 2022-05-13 – 2022-05-15 (×3): 10 mg via ORAL
  Filled 2022-05-13 (×3): qty 1

## 2022-05-13 MED ORDER — POTASSIUM CHLORIDE CRYS ER 20 MEQ PO TBCR
40.0000 meq | EXTENDED_RELEASE_TABLET | Freq: Once | ORAL | Status: AC
Start: 2022-05-13 — End: 2022-05-13
  Administered 2022-05-13: 40 meq via ORAL
  Filled 2022-05-13: qty 2

## 2022-05-13 MED ORDER — SODIUM CHLORIDE 0.9 % IV SOLN: INTRAVENOUS | Status: DC

## 2022-05-13 MED ORDER — LEVOTHYROXINE SODIUM 50 MCG PO TABS
50.0000 ug | ORAL_TABLET | Freq: Every day | ORAL | Status: DC
  Administered 2022-05-13 – 2022-05-16 (×4): 50 ug via ORAL
  Filled 2022-05-13 (×3): qty 1
  Filled 2022-05-13: qty 2

## 2022-05-13 MED ORDER — HYDROMORPHONE HCL 1 MG/ML IJ SOLN: 0.5000 mg | INTRAMUSCULAR | Status: DC | PRN

## 2022-05-13 MED ORDER — MELATONIN 3 MG PO TABS: 3.0000 mg | ORAL_TABLET | Freq: Every evening | ORAL | Status: DC | PRN

## 2022-05-13 MED ORDER — SODIUM CHLORIDE 0.9 % IV BOLUS
2000.0000 mL | Freq: Once | INTRAVENOUS | Status: AC
  Administered 2022-05-13: 2000 mL via INTRAVENOUS

## 2022-05-13 MED ORDER — SODIUM CHLORIDE 0.9 % IV BOLUS
1000.0000 mL | Freq: Once | INTRAVENOUS | Status: AC
  Administered 2022-05-13: 1000 mL via INTRAVENOUS

## 2022-05-13 MED ORDER — ENOXAPARIN SODIUM 40 MG/0.4ML IJ SOSY
40.0000 mg | PREFILLED_SYRINGE | INTRAMUSCULAR | Status: DC
  Administered 2022-05-13 – 2022-05-16 (×4): 40 mg via SUBCUTANEOUS
  Filled 2022-05-13 (×4): qty 0.4

## 2022-05-13 MED ORDER — NALOXONE HCL 0.4 MG/ML IJ SOLN: 0.4000 mg | INTRAMUSCULAR | Status: DC | PRN

## 2022-05-13 MED ORDER — POTASSIUM CHLORIDE 20 MEQ PO PACK
40.0000 meq | PACK | ORAL | Status: AC
  Administered 2022-05-13 (×2): 40 meq via ORAL
  Filled 2022-05-13 (×2): qty 2

## 2022-05-13 MED ORDER — PANTOPRAZOLE SODIUM 40 MG PO TBEC
40.0000 mg | DELAYED_RELEASE_TABLET | Freq: Every evening | ORAL | Status: DC
  Administered 2022-05-13 – 2022-05-15 (×3): 40 mg via ORAL
  Filled 2022-05-13 (×3): qty 1

## 2022-05-13 MED ORDER — POTASSIUM CHLORIDE CRYS ER 10 MEQ PO TBCR
40.0000 meq | EXTENDED_RELEASE_TABLET | ORAL | Status: DC
  Filled 2022-05-13: qty 4

## 2022-05-13 NOTE — ED Notes (Signed)
Admitting Dr. Janee Morn at [facility]. Pt speaking with PCP on phone. Alert, NAD, calm, interactive.

## 2022-05-13 NOTE — ED Notes (Signed)
Lunch order placed

## 2022-05-13 NOTE — H&P (Signed)
History and Physical    Brandy Meyer FWY:[id number] DOB: 08-24-1956 DOA: 05/12/2022  PCP: Kristen Loader, FNP  Patient coming from: Home  Chief Complaint: Muscle aches  HPI: Brandy Meyer is a 66 y.o. female with medical history significant of CAD, hypertension, hyperlipidemia, hypothyroidism presented to the ED with a chief complaint of generalized muscle aches.  Seen by PCP yesterday and labs revealed elevated CK of 5499 concerning for rhabdomyolysis and she was sent to the ED for further evaluation.  Slightly tachycardic on arrival to the ED, afebrile.  Labs showing WBC 15.6, hemoglobin 10.3 (stable), platelet count 279.  Sodium 137, potassium 3.0, chloride 99, bicarb 27, BUN 6, creatinine 1.2 (baseline 0.6), glucose 95.  AST 180, ALT 227, alk phos 51, T. bili 1.3.  CK 5101.  Large amount of hemoglobin seen on urine dipstick, microscopy not done.  Magnesium and phosphorus normal. Patient was given 1 L normal saline bolus.  Patient states she was diagnosed with hypothyroidism 2 weeks ago and started on Synthroid.  Since then she has been experiencing soreness of her thighs, shoulders, and upper back.  Her urine looks clear.  She went to see her PCP yesterday and was told to come into the ED as her lab was abnormal.  No other change in her medications, she has been taking Crestor for several years. Also reports loss of appetite and weight loss of 10 to 12 pounds over the past year for which she is currently being evaluated by her primary care physician.  Patient states she has never had an endoscopy done but had a colonoscopy done last year and was told that her next one will be in 7 years.  She reports chills but no fevers.  Denies cough, shortness of breath, chest pain, nausea, vomiting, abdominal pain, diarrhea, constipation.  Denies history of cigarette smoking.  Review of Systems:  Review of Systems  All other systems reviewed and are negative.   Past Medical History:  Diagnosis Date    Anxiety    Arthritis    CAD in native artery    a. by coronary CT - medical therapy.   History of bronchitis    15 yrs ago   Hyperlipidemia    takes Atorvastatin daily   Hypertension    takes Metoprolol daily   Joint pain    Nocturia    Osteopenia    by DEXA scan at [facility] on January 30, 2009   PFO (patent foramen ovale)    a. small PFO by coronary CT (possible).   Pneumonia    hx of-20 yrs ago   Sinus tachycardia     Past Surgical History:  Procedure Laterality Date   BACK SURGERY     x  2   CESAREAN SECTION     COLONOSCOPY     LUNG SURGERY Left 2003   saw a spot on her lung , removed it, benign   TOTAL HIP ARTHROPLASTY Left 03/30/2017   Procedure: LEFT TOTAL HIP ARTHROPLASTY ANTERIOR APPROACH;  Surgeon: Mcarthur Rossetti, MD;  Location: [place];  Service: Orthopedics;  Laterality: Left;     reports that she has quit smoking. She has never used smokeless tobacco. She reports current alcohol use. She reports that she does not use drugs.  Allergies  Allergen Reactions   Shellfish Allergy Hives, Shortness Of Breath and Swelling    Crab meat and lobster.  Can tolerate small doses of shrimp.    Effexor [Venlafaxine] Other (See Comments)  Sleepy/hyper   Other Other (See Comments)   Penicillins Other (See Comments)    As a child. But recently tolerated Augmentin. Has patient had a PCN reaction causing immediate rash, facial/tongue/throat swelling, SOB or lightheadedness with hypotension: Unknown Has patient had a PCN reaction causing severe rash involving mucus membranes or skin necrosis: Unknown Has patient had a PCN reaction that required hospitalization: Yes Has patient had a PCN reaction occurring within the last 10 years: No If all of the above answers are "NO", then may proceed with Cephalosporin use.    Shellfish-Derived Products Other (See Comments)   Venlafaxine Hcl Other (See Comments)    Family History  Problem Relation Age of Onset   Heart attack Mother     Lung cancer Father    Alcoholism Father     Prior to Admission medications   Medication Sig Start Date End Date Taking? Authorizing Provider  acetaminophen (TYLENOL) 160 MG/5ML elixir Take 500 mg by mouth every 4 (four) hours as needed for fever or pain.   Yes [provider]  aspirin EC 81 MG tablet Take 1 tablet (81 mg total) by mouth daily. Patient taking differently: Take 81 mg by mouth every evening. 01/26/19  Yes Rosita Fire, Brittainy M, PA-C  Carboxymethylcellulose Sodium (REFRESH TEARS OP) Place 1 drop into both eyes 2 (two) times daily as needed (dry eyes).   Yes [provider]  ezetimibe (ZETIA) 10 MG tablet Take 1 tablet (10 mg total) by mouth daily. Patient taking differently: Take 10 mg by mouth every evening. 03/10/22  Yes Jettie Booze, MD  fluticasone [facility]) 50 MCG/ACT nasal spray Place 2 sprays into both nostrils daily as needed for allergies.   Yes [provider]  ibuprofen (ADVIL) 200 MG tablet Take 600 mg by mouth every 6 (six) hours as needed for headache or moderate pain.   Yes [provider]  levothyroxine (SYNTHROID) 50 MCG tablet Take 50 mcg by mouth daily. 04/17/22  Yes [provider]  metoprolol succinate (TOPROL-XL) 25 MG 24 hr tablet TAKE 1 TABLET BY MOUTH DAILY Patient taking differently: Take 25 mg by mouth every evening. 04/23/22  Yes Jettie Booze, MD  nitroGLYCERIN (NITROSTAT) 0.4 MG SL tablet Place 1 tablet (0.4 mg total) under the tongue every 5 (five) minutes as needed for chest pain. 02/23/22  Yes Jettie Booze, MD  pantoprazole (PROTONIX) 40 MG tablet Take 40 mg by mouth every evening. 02/27/22  Yes [provider]  rosuvastatin (CRESTOR) 40 MG tablet TAKE 1 TABLET BY MOUTH DAILY Patient taking differently: Take 40 mg by mouth every evening. 03/12/22   Jettie Booze, MD    Physical Exam: Vitals:   05/12/22 1803 05/12/22 1949 05/12/22 2148 05/12/22 2348  BP:  117/74 116/71  120/80  Pulse:  90 90 85  Resp:  _0 Temp:      TempSrc:      SpO2:  99% 99% 98%  Weight: 57 kg     Height: _1  (1.549 m)       Physical Exam Vitals reviewed.  Constitutional:      General: She is not in acute distress. HENT:     Head: Normocephalic and atraumatic.  Eyes:     Extraocular Movements: Extraocular movements intact.  Cardiovascular:     Rate and Rhythm: Normal rate and regular rhythm.     Pulses: Normal pulses.  Pulmonary:     Effort: Pulmonary effort is normal. No respiratory distress.  Breath sounds: Normal breath sounds. No wheezing or rales.  Abdominal:     General: Bowel sounds are normal. There is no distension.     Palpations: Abdomen is soft.     Tenderness: There is no abdominal tenderness.  Musculoskeletal:        General: No swelling or tenderness.     Cervical back: Normal range of motion.  Skin:    General: Skin is warm and dry.  Neurological:     General: No focal deficit present.     Mental Status: She is alert and oriented to person, place, and time.      Labs on Admission: I have personally reviewed following labs and imaging studies  CBC: Recent Labs  Lab 05/12/22 1823 05/12/22 1856  WBC 15.6*  --   NEUTROABS 5.5  --   HGB 10.3* 9.9*  HCT 30.0* 29.0*  MCV 91.2  --   PLT 267  279  --    Basic Metabolic Panel: Recent Labs  Lab 05/12/22 1823 05/12/22 1856  NA 137 135  K 3.0* 3.0*  CL 99  --   CO2 27  --   GLUCOSE 95  --   BUN 6*  --   CREATININE 1.21*  --   CALCIUM 9.2  --   MG 2.0  --   PHOS 3.2  --    GFR: Estimated Creatinine Clearance: 34.5 mL/min (A) (by C-G formula based on SCr of 1.21 mg/dL (H)). Liver Function Tests: Recent Labs  Lab 05/12/22 1823  AST 180*  ALT 227*  ALKPHOS 51  BILITOT 1.3*  PROT 6.1*  ALBUMIN 3.5   No results for input(s): "LIPASE", "AMYLASE" in the last 168 hours. No results for input(s): "AMMONIA" in the last 168 hours. Coagulation Profile: Recent Labs  Lab  05/12/22 1823  INR 1.1   Cardiac Enzymes: Recent Labs  Lab 05/12/22 1823  CKTOTAL 5,101*  CKMB 64.7*   BNP (last 3 results) No results for input(s): "PROBNP" in the last 8760 hours. HbA1C: No results for input(s): "HGBA1C" in the last 72 hours. CBG: No results for input(s): "GLUCAP" in the last 168 hours. Lipid Profile: No results for input(s): "CHOL", "HDL", "LDLCALC", "TRIG", "CHOLHDL", "LDLDIRECT" in the last 72 hours. Thyroid Function Tests: No results for input(s): "TSH", "T4TOTAL", "FREET4", "T3FREE", "THYROIDAB" in the last 72 hours. Anemia Panel: No results for input(s): "VITAMINB12", "FOLATE", "FERRITIN", "TIBC", "IRON", "RETICCTPCT" in the last 72 hours. Urine analysis:    Component Value Date/Time   COLORURINE STRAW (A) 05/12/2022 1823   APPEARANCEUR CLEAR 05/12/2022 1823   LABSPEC 1.002 (L) 05/12/2022 1823   PHURINE 6.0 05/12/2022 1823   GLUCOSEU NEGATIVE 05/12/2022 1823   HGBUR LARGE (A) 05/12/2022 1823   BILIRUBINUR NEGATIVE 05/12/2022 1823   KETONESUR NEGATIVE 05/12/2022 1823   PROTEINUR NEGATIVE 05/12/2022 1823   NITRITE NEGATIVE 05/12/2022 1823   LEUKOCYTESUR NEGATIVE 05/12/2022 1823    Radiological Exams on Admission: I have personally reviewed images No results found.  EKG: Independently reviewed.  Sinus rhythm, new first-degree AV block.  Assessment and Plan  Acute nontraumatic rhabdomyolysis Unknown exact etiology.  Reportedly taking rosuvastatin and ezetimibe for several years. ?Severe hypothyroidism as an etiology as she was recently started on Synthroid.  TSH unknown.  CK 5101. -Continue IV fluid hydration and trend CK. -Hold Crestor -Check TSH  AKI in the setting of acute rhabdomyolysis Creatinine 1.2, baseline 0.6. -Continue IV fluid hydration and monitor renal function closely.  Avoid nephrotoxic agents.  Acute transaminitis  in the setting of acute rhabdomyolysis -Continue to monitor -Avoid hepatotoxic agents  Leukocytosis Likely  reactive.  No signs of infection. -Continue to monitor  Mild hypokalemia -Replace potassium and continue to monitor closely.  Magnesium is normal.  Hypertension Stable. Hold metoprolol at this time given new first-degree AV block.  Hyperlipidemia -Hold Crestor at this time.  Continue ezetimibe.  Hypothyroidism -Continue Synthroid and check TSH level.  New first-degree AV block on EKG Likely due to hypothyroidism. -Hold home beta-blocker at this time and check TSH level  GERD -Continue Protonix  Poor appetite/unintentional weight loss Currently being evaluated by PCP.  Reportedly last colonoscopy a year ago but never had endoscopy done. -Please ensure close follow-up with PCP and GI.  DVT prophylaxis: Lovenox Code Status: Full Code (discussed with the patient) Family Communication: No family available at this time. Level of care: Telemetry bed Admission status: It is my clinical opinion that admission to INPATIENT is reasonable and necessary because of the expectation that this patient will require hospital care that crosses at least 2 midnights to treat this condition based on the medical complexity of the problems presented.  Given the aforementioned information, the predictability of an adverse outcome is felt to be significant.   Shela Leff MD [facility]  If 7PM-7AM, please contact night-coverage [email or web address]  05/13/2022, 2:55 AM

## 2022-05-13 NOTE — ED Notes (Signed)
PT at [facility]. Ambulatory with steady gait.

## 2022-05-13 NOTE — Evaluation (Signed)
Physical Therapy Evaluation Patient Details Name: Brandy Meyer MRN: [id number] DOB: May 08, 1956 Today's Date: 05/13/2022  History of Present Illness  Pt is a 66 y/o female admitted secondary to muscle aches and weakness. Found to have rhabdomyolysis. Workup pending. PMH includes CAD, HTN, PFO and CAD.  Clinical Impression  Pt admitted secondary to problem above with deficits below. Pt reporting some pain in bilateral thighs and mild weakness. Requiring min guard for safety throughout. Educated about walking program and monitoring signs of muscle fatigue. Anticipate pt will progress well and will not require follow up PT. Will continue to follow acutely.        Recommendations for follow up therapy are one component of a multi-disciplinary discharge planning process, led by the attending physician.  Recommendations may be updated based on patient status, additional functional criteria and insurance authorization.  Follow Up Recommendations No PT follow up    Assistance Recommended at Discharge Intermittent Supervision/Assistance  Patient can return home with the following  Assistance with cooking/housework    Equipment Recommendations None recommended by PT  Recommendations for Other Services       Functional Status Assessment Patient has had a recent decline in their functional status and demonstrates the ability to make significant improvements in function in a reasonable and predictable amount of time.     Precautions / Restrictions Precautions Precautions: Fall Restrictions Weight Bearing Restrictions: No      Mobility  Bed Mobility Overal bed mobility: Modified Independent                  Transfers Overall transfer level: Needs assistance Equipment used: None Transfers: Sit to/from Stand Sit to Stand: Min guard           General transfer comment: Min guard to stand from higher stretcher height    Ambulation/Gait Ambulation/Gait assistance: Min  guard Gait Distance (Feet): 70 Feet Assistive device: None Gait Pattern/deviations: Step-through pattern, Decreased stride length Gait velocity: Decreased     General Gait Details: Reports increased weakness in RLE and "giving way" sensation. Educated about walking program at home and to monitor for signs of fatigue.  Stairs            Wheelchair Mobility    Modified Rankin (Stroke Patients Only)       Balance Overall balance assessment: Mild deficits observed, not formally tested                                           Pertinent Vitals/Pain Pain Assessment Pain Assessment: Faces Faces Pain Scale: Hurts even more Pain Location: bilateral thighs and back Pain Descriptors / Indicators: Guarding, Grimacing Pain Intervention(s): Monitored during session, Limited activity within patient's tolerance, Repositioned    Home Living Family/patient expects to be discharged to:: Private residence Living Arrangements: Spouse/significant other Available Help at Discharge: Family Type of Home: House Home Access: Level entry       Home Layout: One level Home Equipment: BSC/3in1;Shower seat;Cane - single point      Prior Function Prior Level of Function : Independent/Modified Independent                     Hand Dominance        Extremity/Trunk Assessment   Upper Extremity Assessment Upper Extremity Assessment: Generalized weakness    Lower Extremity Assessment Lower Extremity Assessment: Generalized weakness    Cervical /  Trunk Assessment Cervical / Trunk Assessment: Kyphotic  Communication   Communication: No difficulties  Cognition Arousal/Alertness: Awake/alert Behavior During Therapy: WFL for tasks assessed/performed Overall Cognitive Status: Within Functional Limits for tasks assessed                                          General Comments General comments (skin integrity, edema, etc.): Pt's husband  present    Exercises     Assessment/Plan    PT Assessment Patient needs continued PT services  PT Problem List Decreased strength;Decreased mobility;Decreased activity tolerance;Pain       PT Treatment Interventions Gait training;Functional mobility training;Therapeutic activities;Therapeutic exercise;Patient/family education    PT Goals (Current goals can be found in the Care Plan section)  Acute Rehab PT Goals Patient Stated Goal: to go home PT Goal Formulation: With patient Time For Goal Achievement: 05/27/22 Potential to Achieve Goals: Good    Frequency Min 2X/week     Co-evaluation               AM-PAC PT "6 Clicks" Mobility  Outcome Measure Help needed turning from your back to your side while in a flat bed without using bedrails?: None Help needed moving from lying on your back to sitting on the side of a flat bed without using bedrails?: None Help needed moving to and from a bed to a chair (including a wheelchair)?: A Little Help needed standing up from a chair using your arms (e.g., wheelchair or bedside chair)?: A Little Help needed to walk in hospital room?: A Little Help needed climbing 3-5 steps with a railing? : A Lot 6 Click Score: 19    End of Session   Activity Tolerance: Patient tolerated treatment well Patient left: in chair;with call bell/phone within reach;with family/visitor present (in recliner in ED) Nurse Communication: Mobility status PT Visit Diagnosis: Other abnormalities of gait and mobility (R26.89);Muscle weakness (generalized) (M62.81)    Time: 8295-6213 PT Time Calculation (min) (ACUTE ONLY): 24 min   Charges:   PT Evaluation $PT Eval Low Complexity: 1 Low PT Treatments $Gait Training: 8-22 mins        Brandy Meyer, DPT  Acute Rehabilitation Services  Office: [phone]   Brandy Meyer 05/13/2022, 4:28 PM

## 2022-05-13 NOTE — ED Provider Notes (Signed)
[facility] EMERGENCY DEPARTMENT Provider Note   CSN: [id number] Arrival date & time: 05/12/22  1747     History  Chief Complaint  Patient presents with   Abnormal Labs    Brandy Meyer is a 66 y.o. female.  The history is provided by the patient and medical records.   66 year old female with history of hyperlipidemia, anxiety, hypertension, new hypothyroidism, presenting to the ED with generalized muscle aching.  States this has been ongoing for the past 2 weeks since starting Synthroid.  Mostly has soreness in her thighs, back, and upper arms.  States it is gotten to the point where when squatting down to clean up litter box or something similar, she has difficulty standing back up.  She describes it as an intense, unrelenting ache.  She was seen by PCP today and had CK of 5499 and sent to the ED for further evaluation.  She is also on a statin, has been taking this for the past several years without any noted issues.  Her doctor did tell her to stop this along with the Synthroid so has not taken either of them today.  She denies any chest pain or shortness of breath.  She has not noticed any dark or bloody urine.  Denies any significant exertional activity or new exercise program.  Denies any abdominal pain, nausea, or vomiting.  Home Medications Prior to Admission medications   Medication Sig Start Date End Date Taking? Authorizing Provider  aspirin EC 81 MG tablet Take 1 tablet (81 mg total) by mouth daily. 01/26/19   Lyda Jester M, PA-C  ezetimibe (ZETIA) 10 MG tablet Take 1 tablet (10 mg total) by mouth daily. 03/10/22   Jettie Booze, MD  fluticasone [facility]) 50 MCG/ACT nasal spray Place 2 sprays into both nostrils every evening.    [provider]  metoprolol succinate (TOPROL-XL) 25 MG 24 hr tablet TAKE 1 TABLET BY MOUTH DAILY 04/23/22   Jettie Booze, MD  nitroGLYCERIN (NITROSTAT) 0.4 MG SL tablet Place 1 tablet (0.4 mg total)  under the tongue every 5 (five) minutes as needed for chest pain. 02/23/22   Jettie Booze, MD  rosuvastatin (CRESTOR) 40 MG tablet TAKE 1 TABLET BY MOUTH DAILY 03/12/22   Jettie Booze, MD      Allergies    Shellfish allergy, Effexor [venlafaxine], Other, Penicillins, Shellfish-derived products, and Venlafaxine hcl    Review of Systems   Review of Systems  Musculoskeletal:  Positive for myalgias.  All other systems reviewed and are negative.   Physical Exam Updated Vital Signs BP 120/80   Pulse 85   Temp 98.8 F (37.1 C) (Oral)   Resp 17   Ht _0  (1.549 m)   Wt 57 kg   SpO2 98%   BMI 23.74 kg/m   Physical Exam Vitals and nursing note reviewed.  Constitutional:      Appearance: She is well-developed.  HENT:     Head: Normocephalic and atraumatic.  Eyes:     Conjunctiva/sclera: Conjunctivae normal.     Pupils: Pupils are equal, round, and reactive to light.  Cardiovascular:     Rate and Rhythm: Normal rate and regular rhythm.     Heart sounds: Normal heart sounds.  Pulmonary:     Effort: Pulmonary effort is normal.     Breath sounds: Normal breath sounds.  Abdominal:     General: Bowel sounds are normal.     Palpations: Abdomen is soft.  Musculoskeletal:  General: Normal range of motion.     Cervical back: Normal range of motion.     Comments: No significant swelling noted to the extremities, some tenderness noted to the thighs and upper arms bilaterally, pulses intact x4  Skin:    General: Skin is warm and dry.  Neurological:     Mental Status: She is alert and oriented to person, place, and time.     ED Results / Procedures / Treatments   Labs (all labs ordered are listed, but only abnormal results are displayed) Labs Reviewed  COMPREHENSIVE METABOLIC PANEL - Abnormal; Notable for the following components:      Result Value   Potassium 3.0 (*)    BUN 6 (*)    Creatinine, Ser 1.21 (*)    Total Protein 6.1 (*)    AST 180 (*)    ALT  227 (*)    Total Bilirubin 1.3 (*)    GFR, Estimated 49 (*)    All other components within normal limits  CBC WITH DIFFERENTIAL/PLATELET - Abnormal; Notable for the following components:   WBC 15.6 (*)    RBC 3.29 (*)    Hemoglobin 10.3 (*)    HCT 30.0 (*)    Lymphs Abs 9.0 (*)    All other components within normal limits  CK TOTAL AND CKMB (NOT AT [facility]) - Abnormal; Notable for the following components:   Total CK 5,101 (*)    CK, MB 64.7 (*)    All other components within normal limits  URINALYSIS, ROUTINE W REFLEX MICROSCOPIC - Abnormal; Notable for the following components:   Color, Urine STRAW (*)    Specific Gravity, Urine 1.002 (*)    Hgb urine dipstick LARGE (*)    All other components within normal limits  DIC (DISSEMINATED INTRAVASCULAR COAGULATION)PANEL - Abnormal; Notable for the following components:   D-Dimer, Quant 0.86 (*)    All other components within normal limits  I-STAT VENOUS BLOOD GAS, ED - Abnormal; Notable for the following components:   pH, Ven 7.504 (*)    pCO2, Ven 37.0 (*)    pO2, Ven 203 (*)    Bicarbonate 29.1 (*)    Acid-Base Excess 6.0 (*)    Potassium 3.0 (*)    Calcium, Ion 1.05 (*)    HCT 29.0 (*)    Hemoglobin 9.9 (*)    All other components within normal limits  MAGNESIUM  URIC ACID  PHOSPHORUS  PATHOLOGIST SMEAR REVIEW  HIV ANTIBODY (ROUTINE TESTING W REFLEX)  TSH  CK  COMPREHENSIVE METABOLIC PANEL  CBC    EKG None  Radiology No results found.  Procedures Procedures    Medications Ordered in ED Medications  0.9 %  sodium chloride infusion (has no administration in time range)  levothyroxine (SYNTHROID) tablet 50 mcg (has no administration in time range)  pantoprazole (PROTONIX) EC tablet 40 mg (has no administration in time range)  enoxaparin (LOVENOX) injection 40 mg (has no administration in time range)  potassium chloride SA (KLOR-CON M) CR tablet 40 mEq (has no administration in time range)  sodium chloride 0.9 %  bolus 1,000 mL (0 mLs Intravenous Stopped 05/13/22 0231)    ED Course/ Medical Decision Making/ A&P                          Medical Decision Making Risk Prescription drug management. Decision regarding hospitalization.   66 y.o. F here with abnormal labs-- elevated CK.  Does report  muscle aches in thighs and upper arms bilaterally for the past 2 weeks.  Denies hematuria or dark urine.  No significant abnormal findings on physical exam.  VSS.  Labs as above-- leukocytosis 15.6.  SrCr 1.21.  CK 5101. Clinical nontraumatic rhabdomyolysis.  Also has transaminitis which is likely related, AST 180, ALT 227, bili 1.3, normal alk phos.  She is not clinically jaundiced.  No abdominal pain.  Does have blood noted in the urine, suspect this is myoglobin.  Has been on statin without issues of the past several years but recently added Synthroid.  Both of those currently being held by PCP instruction. We will start IV fluids.  She will require admission.  Discussed with hospitalist, Dr. Marlowe Sax-- will admit for ongoing care.   Diagnoses Final diagnoses:  Non-traumatic rhabdomyolysis  Transaminitis    Rx / DC Orders ED Discharge Orders     None         Jenay, [place], PA-C 05/13/22 [place], MD 05/13/22 [phone]

## 2022-05-13 NOTE — Progress Notes (Signed)
PROGRESS NOTE    Brandy Meyer  ZOX:[id number] DOB: 1956/08/25 DOA: 05/12/2022 PCP: Soundra Pilon, FNP    Chief Complaint  Patient presents with   Abnormal Labs    Brief Narrative:  Patient 66 year old female history of CAD, hypertension, hyperlipidemia, hypothyroidism recently started on Synthroid in May 2023 presented to the ED with chief complaint of generalized muscle aches.  Patient seen by PCP 1 day prior to admission noted to have elevated CK level of 5499 concerning for rhabdomyolysis patient sent to the ED.  Patient seen in the ED noted to have a leukocytosis, transaminitis, elevated CK level, large amount of hemoglobin noted on urine dipstick.  Patient given a 1 L bolus of IV fluids.  Patient placed on aggressive fluid resuscitation.  Patient admitted for nontraumatic rhabdomyolysis.   Assessment & Plan:   Principal Problem:   Rhabdomyolysis Active Problems:   Hyperlipidemia   AKI (acute kidney injury) (HCC)   Transaminitis   Leukocytosis   Hypokalemia   Essential hypertension   Hypothyroidism   First degree AV block  #1 acute nontraumatic rhabdomyolysis -Patient noted to have recently been diagnosed with hypothyroidism 2 weeks ago and started on Synthroid.  Patient also with history of hyperlipidemia on Crestor and ezetimibe for several years. -Likely secondary to hypothyroidism in the setting of statin use which increases risk of myopathy. -Patient noted to have a transaminitis. -CK levels trending down. -Statin currently held. -TSH within normal limits. -Continue Synthroid. -Continue aggressive fluid resuscitation. -Increase IV fluids to 200 cc/h.  Given 2 L bolus of normal saline. -Strict I's and O's. -Supportive care.  2.  AKI in the setting of acute rhabdomyolysis -Creatinine noted at 1.2 with a baseline of 0.6. -Urinalysis with large hemoglobin, nitrite negative, leukocytes negative, protein negative. -Renal function improving with  hydration. -Aggressive IV fluids. -Monitor urine output.  3.  Hypokalemia -Magnesium level of 2 -K-Dur 40 mEq p.o. every 4 hours x2 doses. -Repeat labs in the AM.  4.  Transaminitis -Likely secondary to problem #1. -Improving with hydration. -Continue aggressive fluid resuscitation. -Repeat labs in the morning.  5.  Leukocytosis -Likely reactive leukocytosis. -Patient afebrile. -No signs of acute infection. -Leukocytosis trending down. -Follow.  6.  Hypertension -. Stable. -Patient noted with first-degree AV block and as such beta-blocker on hold. -Outpatient follow-up.  7.  Hypothyroidism -TSH within normal limits. -Continue home regimen Synthroid.  8.  Hyperlipidemia -Patient with newly diagnosed hypothyroidism recently started on Synthroid. -On Crestor and Zetia for hyperlipidemia. -Patient now with a rhabdomyolysis in the setting of statin therapy and hypothyroidism. -Discontinue statin. -We will need repeat lipid panel in a few weeks. -Continue Zetia. -Will need to follow-up in the outpatient setting with advanced lipid disorders clinic which [facility] cardiology.  9.  New first-degree AV block on EKG -Likely could be secondary to hypothyroidism. -TSH within normal limits. -Beta-blocker on hold.  10.  Poor appetite/unintentional weight loss -Being followed up in the outpatient setting per PCP. -Noted to have had a colonoscopy done approximately a year ago however not an endoscopy done May need to follow-up with GI and PCP.    DVT prophylaxis: Lovenox Code Status: Full Family Communication: Updated patient.  No family at bedside. Disposition: Home when clinically improved.  Status is: Inpatient Remains inpatient appropriate because: Severity of illness   Consultants:  None  Procedures:  None  Antimicrobials:  None   Subjective: Patient sitting up on gurney.  States overall myalgias improving since admission.  No chest pain.  No shortness of breath.   No abdominal pain.  Objective: Vitals:   05/13/22 0600 05/13/22 0700 05/13/22 1000 05/13/22 1135  BP: 108/64 (!) 107/58 111/61 118/74  Pulse: 91 85 91 90  Resp: (!) 22 (!) 23 19 20   Temp:    98.9 F (37.2 C)  TempSrc:    Oral  SpO2: 96% 97% 97% 99%  Weight:      Height:        Intake/Output Summary (Last 24 hours) at 05/13/2022 1143 Last data filed at 05/13/2022 0231 Gross per 24 hour  Intake 1000 ml  Output --  Net 1000 ml   Filed Weights   05/12/22 1803  Weight: 57 kg    Examination:  General exam: Appears calm and comfortable  Respiratory system: Clear to auscultation. Respiratory effort normal. Cardiovascular system: S1 & S2 heard, RRR. No JVD, murmurs, rubs, gallops or clicks. No pedal edema. Gastrointestinal system: Abdomen is nondistended, soft and nontender. No organomegaly or masses felt. Normal bowel sounds heard. Central nervous system: Alert and oriented. No focal neurological deficits. Extremities: Symmetric 5 x 5 power. Skin: No rashes, lesions or ulcers Psychiatry: Judgement and insight appear normal. Mood & affect appropriate.     Data Reviewed: I have personally reviewed following labs and imaging studies  CBC: Recent Labs  Lab 05/12/22 1823 05/12/22 1856 05/13/22 0428  WBC 15.6*  --  13.9*  NEUTROABS 5.5  --   --   HGB 10.3* 9.9* 9.5*  HCT 30.0* 29.0* 28.7*  MCV 91.2  --  92.3  PLT 267  279  --  252    Basic Metabolic Panel: Recent Labs  Lab 05/12/22 1823 05/12/22 1856 05/13/22 0428  NA 137 135 139  K 3.0* 3.0* 2.9*  CL 99  --  104  CO2 27  --  25  GLUCOSE 95  --  96  BUN 6*  --  8  CREATININE 1.21*  --  1.13*  CALCIUM 9.2  --  8.4*  MG 2.0  --   --   PHOS 3.2  --   --     GFR: Estimated Creatinine Clearance: 37 mL/min (A) (by C-G formula based on SCr of 1.13 mg/dL (H)).  Liver Function Tests: Recent Labs  Lab 05/12/22 1823 05/13/22 0428  AST 180* 118*  ALT 227* 186*  ALKPHOS 51 43  BILITOT 1.3* 1.5*  PROT 6.1*  5.5*  ALBUMIN 3.5 3.3*    CBG: No results for input(s): "GLUCAP" in the last 168 hours.   No results found for this or any previous visit (from the past 240 hour(s)).       Radiology Studies: No results found.      Scheduled Meds:  enoxaparin (LOVENOX) injection  40 mg Subcutaneous Q24H   ezetimibe  10 mg Oral QPM   levothyroxine  50 mcg Oral Daily   pantoprazole  40 mg Oral QPM   potassium chloride  40 mEq Oral Q4H   Continuous Infusions:  sodium chloride 150 mL/hr at 05/13/22 0418     LOS: 0 days    Time spent: 40 minutes  No charge    05/15/22, MD [facility]   To contact the attending provider between 7A-7P or the covering provider during after hours 7P-7A, please log into the web site [email or web address] and access using universal [place] password for that web site. If you do not have the password, please call the hospital operator.  05/13/2022, 11:43 AM

## 2022-05-14 LAB — CBC WITH DIFFERENTIAL/PLATELET
Abs Immature Granulocytes: 0.03 10*3/uL (ref 0.00–0.07)
Basophils Absolute: 0.1 10*3/uL (ref 0.0–0.1)
Basophils Relative: 0 %
Eosinophils Absolute: 0.2 10*3/uL (ref 0.0–0.5)
Eosinophils Relative: 2 %
HCT: 25.3 % — ABNORMAL LOW (ref 36.0–46.0)
Hemoglobin: 8.2 g/dL — ABNORMAL LOW (ref 12.0–15.0)
Immature Granulocytes: 0 %
Lymphocytes Relative: 55 %
Lymphs Abs: 6.6 10*3/uL — ABNORMAL HIGH (ref 0.7–4.0)
MCH: 30.3 pg (ref 26.0–34.0)
MCHC: 32.4 g/dL (ref 30.0–36.0)
MCV: 93.4 fL (ref 80.0–100.0)
Monocytes Absolute: 0.7 10*3/uL (ref 0.1–1.0)
Monocytes Relative: 6 %
Neutro Abs: 4.4 10*3/uL (ref 1.7–7.7)
Neutrophils Relative %: 37 %
Platelets: 194 10*3/uL (ref 150–400)
RBC: 2.71 MIL/uL — ABNORMAL LOW (ref 3.87–5.11)
RDW: 13.8 % (ref 11.5–15.5)
WBC: 11.9 10*3/uL — ABNORMAL HIGH (ref 4.0–10.5)
nRBC: 0 % (ref 0.0–0.2)

## 2022-05-14 LAB — COMPREHENSIVE METABOLIC PANEL
ALT: 112 U/L — ABNORMAL HIGH (ref 0–44)
AST: 47 U/L — ABNORMAL HIGH (ref 15–41)
Albumin: 2.7 g/dL — ABNORMAL LOW (ref 3.5–5.0)
Alkaline Phosphatase: 36 U/L — ABNORMAL LOW (ref 38–126)
Anion gap: 8 (ref 5–15)
BUN: 7 mg/dL — ABNORMAL LOW (ref 8–23)
CO2: 19 mmol/L — ABNORMAL LOW (ref 22–32)
Calcium: 7.9 mg/dL — ABNORMAL LOW (ref 8.9–10.3)
Chloride: 114 mmol/L — ABNORMAL HIGH (ref 98–111)
Creatinine, Ser: 1 mg/dL (ref 0.44–1.00)
GFR, Estimated: >60 mL/min (ref >60)
Glucose, Bld: 96 mg/dL (ref 70–99)
Potassium: 3.5 mmol/L (ref 3.5–5.1)
Sodium: 141 mmol/L (ref 135–145)
Total Bilirubin: 1.3 mg/dL — ABNORMAL HIGH (ref 0.3–1.2)
Total Protein: 4.7 g/dL — ABNORMAL LOW (ref 6.5–8.1)

## 2022-05-14 LAB — HEMOGLOBIN AND HEMATOCRIT, BLOOD
HCT: 28.8 % — ABNORMAL LOW (ref 36.0–46.0)
Hemoglobin: 9.4 g/dL — ABNORMAL LOW (ref 12.0–15.0)

## 2022-05-14 LAB — FOLATE: Folate: 7.2 ng/mL (ref >5.9)

## 2022-05-14 LAB — IRON AND TIBC
Iron: 70 ug/dL (ref 28–170)
Saturation Ratios: 23 % (ref 10.4–31.8)
TIBC: 300 ug/dL (ref 250–450)
UIBC: 230 ug/dL

## 2022-05-14 LAB — VITAMIN B12: Vitamin B-12: 284 pg/mL (ref 180–914)

## 2022-05-14 LAB — OCCULT BLOOD X 1 CARD TO LAB, STOOL: Fecal Occult Bld: NEGATIVE

## 2022-05-14 LAB — CK: Total CK: 1864 U/L — ABNORMAL HIGH (ref 38–234)

## 2022-05-14 LAB — FERRITIN: Ferritin: 129 ng/mL (ref 11–307)

## 2022-05-14 LAB — MAGNESIUM: Magnesium: 1.5 mg/dL — ABNORMAL LOW (ref 1.7–2.4)

## 2022-05-14 MED ORDER — MAGNESIUM SULFATE 4 GM/100ML IV SOLN
4.0000 g | Freq: Once | INTRAVENOUS | Status: AC
  Administered 2022-05-14: 4 g via INTRAVENOUS
  Filled 2022-05-14: qty 100

## 2022-05-14 MED ORDER — ADULT MULTIVITAMIN W/MINERALS CH
1.0000 | ORAL_TABLET | Freq: Every day | ORAL | Status: DC
  Administered 2022-05-14 – 2022-05-15 (×2): 1 via ORAL
  Filled 2022-05-14 (×3): qty 1

## 2022-05-14 MED ORDER — POLYVINYL ALCOHOL 1.4 % OP SOLN: 1.0000 [drp] | Freq: Two times a day (BID) | OPHTHALMIC | Status: DC | PRN

## 2022-05-14 MED ORDER — OXYCODONE HCL 5 MG PO TABS: 5.0000 mg | ORAL_TABLET | Freq: Four times a day (QID) | ORAL | Status: DC | PRN

## 2022-05-14 MED ORDER — POTASSIUM CHLORIDE 20 MEQ PO PACK
40.0000 meq | PACK | Freq: Once | ORAL | Status: AC
  Administered 2022-05-14: 40 meq via ORAL
  Filled 2022-05-14: qty 2

## 2022-05-14 MED ORDER — POTASSIUM CHLORIDE CRYS ER 10 MEQ PO TBCR
40.0000 meq | EXTENDED_RELEASE_TABLET | Freq: Once | ORAL | Status: DC
  Filled 2022-05-14: qty 4

## 2022-05-14 MED ORDER — ENSURE ENLIVE PO LIQD
237.0000 mL | Freq: Two times a day (BID) | ORAL | Status: DC
  Administered 2022-05-14 – 2022-05-15 (×2): 237 mL via ORAL

## 2022-05-14 MED ORDER — ASPIRIN 81 MG PO TBEC
81.0000 mg | DELAYED_RELEASE_TABLET | Freq: Every evening | ORAL | Status: DC
  Administered 2022-05-14 – 2022-05-15 (×2): 81 mg via ORAL
  Filled 2022-05-14 (×2): qty 1

## 2022-05-14 MED ORDER — FLUTICASONE PROPIONATE 50 MCG/ACT NA SUSP: 2.0000 | Freq: Every day | NASAL | Status: DC | PRN

## 2022-05-14 MED ORDER — FUROSEMIDE 10 MG/ML IJ SOLN
40.0000 mg | Freq: Two times a day (BID) | INTRAMUSCULAR | Status: AC
  Administered 2022-05-14 (×2): 40 mg via INTRAVENOUS
  Filled 2022-05-14 (×2): qty 4

## 2022-05-14 MED ORDER — ACETAMINOPHEN 500 MG PO TABS
500.0000 mg | ORAL_TABLET | Freq: Four times a day (QID) | ORAL | Status: DC | PRN
  Administered 2022-05-15: 500 mg via ORAL
  Filled 2022-05-14: qty 1

## 2022-05-14 MED ORDER — METOPROLOL SUCCINATE ER 25 MG PO TB24
12.5000 mg | ORAL_TABLET | Freq: Every day | ORAL | Status: DC
Start: 2022-05-14 — End: 2022-05-16
  Administered 2022-05-14 – 2022-05-16 (×3): 12.5 mg via ORAL
  Filled 2022-05-14 (×3): qty 1

## 2022-05-14 NOTE — Evaluation (Signed)
Occupational Therapy Evaluation Patient Details Name: Brandy Meyer MRN: [id number] DOB: Oct 10, 1956 Today's Date: 05/14/2022   History of Present Illness Pt is a 66 y/o female admitted secondary to muscle aches and weakness. Found to have rhabdomyolysis. Workup pending. PMH includes CAD, HTN, PFO and CAD.   Clinical Impression   Pt is currently supervision to min guard assist overall for selfcare, toileting, and functional mobility without an assistive device.  She still reports increased weakness in her LEs with increased sensory changes in the RLE with slight knee buckling also noted once she fatigued.  Feel she will benefit from acute care OT to help increase strength and overall endurance for completion of selfcare tasks.  Do not anticipate any follow-up post acute OT.      Recommendations for follow up therapy are one component of a multi-disciplinary discharge planning process, led by the attending physician.  Recommendations may be updated based on patient status, additional functional criteria and insurance authorization.   Follow Up Recommendations  No OT follow up    Assistance Recommended at Discharge PRN  Patient can return home with the following Other (comment) (Pt will likely be modified independent)    Functional Status Assessment  Patient has had a recent decline in their functional status and demonstrates the ability to make significant improvements in function in a reasonable and predictable amount of time.  Equipment Recommendations  None recommended by OT       Precautions / Restrictions Precautions Precautions: Fall Restrictions Weight Bearing Restrictions: No      Mobility Bed Mobility                    Transfers Overall transfer level: Needs assistance Equipment used: None Transfers: Sit to/from Stand, Bed to chair/wheelchair/BSC Sit to Stand: Supervision     Step pivot transfers: Min guard            Balance Overall balance  assessment: Needs assistance   Sitting balance-Leahy Scale: Normal     Standing balance support: No upper extremity supported, During functional activity Standing balance-Leahy Scale: Good                             ADL either performed or assessed with clinical judgement   ADL Overall ADL's : Needs assistance/impaired Eating/Feeding: Independent;Sitting   Grooming: Wash/dry hands;Wash/dry face;Min guard;Standing Grooming Details (indicate cue type and reason): no assistive device Upper Body Bathing: Set up;Sitting   Lower Body Bathing: Min guard;Sit to/from stand   Upper Body Dressing : Set up;Sitting   Lower Body Dressing: Min guard;Sit to/from stand   Toilet Transfer: Min guard;Ambulation;Regular Toilet (no assistive device, pt holding IV pole)   Toileting- Clothing Manipulation and Hygiene: Supervision/safety;Sit to/from stand       Functional mobility during ADLs: Min guard General ADL Comments: Pt currently min guard for simulated selfcare tasks overall.  Oxygen sats at 92-95% with activity on room air with HR elevated up to 101 during mobility.  Noted one episode of slight right knee buckling after ambulating over 300' without an assistive device but otherwise OK.  She reports having a walk-in shower with a shower seat as well as 3:1.     Vision Baseline Vision/History: 1 Wears glasses (near vision) Ability to See in Adequate Light: 0 Adequate Patient Visual Report: No change from baseline Vision Assessment?: No apparent visual deficits     Perception Perception Perception: Within Functional Limits   Praxis  Praxis Praxis: Intact    Pertinent Vitals/Pain Pain Assessment Pain Assessment: Faces Faces Pain Scale: Hurts a little bit Pain Location: right thigh when crossing LEs Pain Descriptors / Indicators: Grimacing Pain Intervention(s): Limited activity within patient's tolerance     Hand Dominance     Extremity/Trunk Assessment Upper  Extremity Assessment Upper Extremity Assessment: Generalized weakness (strength 4/5 throughout except grip 3+/5)   Lower Extremity Assessment Lower Extremity Assessment: Defer to PT evaluation   Cervical / Trunk Assessment Cervical / Trunk Assessment: Normal   Communication     Cognition Arousal/Alertness: Awake/alert Behavior During Therapy: WFL for tasks assessed/performed Overall Cognitive Status: Within Functional Limits for tasks assessed                                                             OT Problem List: Decreased strength;Decreased activity tolerance;Impaired balance (sitting and/or standing);Pain      OT Treatment/Interventions: Self-care/ADL training;Therapeutic exercise;Balance training;Therapeutic activities;Energy conservation;DME and/or AE instruction;Patient/family education    OT Goals(Current goals can be found in the care plan section) Acute Rehab OT Goals Patient Stated Goal: Pt requests to get back to her normal activities OT Goal Formulation: With patient Time For Goal Achievement: 05/21/22 Potential to Achieve Goals: Good  OT Frequency: Min 2X/week       AM-PAC OT "6 Clicks" Daily Activity     Outcome Measure Help from another person eating meals?: None Help from another person taking care of personal grooming?: A Little Help from another person toileting, which includes using toliet, bedpan, or urinal?: A Little Help from another person bathing (including washing, rinsing, drying)?: A Little Help from another person to put on and taking off regular upper body clothing?: None Help from another person to put on and taking off regular lower body clothing?: A Little 6 Click Score: 20   End of Session Equipment Utilized During Treatment: Gait belt Nurse Communication: Mobility status  Activity Tolerance: Patient tolerated treatment well Patient left: in chair;with call bell/phone within reach;with family/visitor  present  OT Visit Diagnosis: Unsteadiness on feet (R26.81);Muscle weakness (generalized) (M62.81);Pain Pain - Right/Left: Right Pain - part of body: Leg                Time: 1129-1200 OT Time Calculation (min): 31 min Charges:  OT General Charges $OT Visit: 1 Visit OT Evaluation $OT Eval Moderate Complexity: 1 Mod OT Treatments $Self Care/Home Management : 8-22 mins  Bastien Strawser OTR/L 05/14/2022, 12:36 PM

## 2022-05-14 NOTE — Progress Notes (Signed)
   05/13/22 2304  Pain Assessment  Pain Scale 0-10  Pain Score 5  Pain Type Chronic pain  Pain Location Back  Pain Orientation Lower  Pain Descriptors / Indicators Aching  Pain Frequency Intermittent  Pain Onset On-going  Pain Intervention(s) Repositioned;Emotional support;MD notified (Comment)  Provider Notification  Provider Name/Title Royal Hawthorn MD  Date Provider Notified 05/13/22  Time Provider Notified 2304  Method of Notification Page  Notification Reason Requested by patient/family  Provider response See new orders   Patient is c/o chronic lower back pain and soreness to bilateral thighs.  She is also c/o insomnia.  MD made aware and new orders received.  Upon reassessment, patient wants to hold on the IV Dilaudid and Melatonin.  She is worried that she will be too drowsy to wake to go to bathroom.  Will continue to monitor patient.  Bernie Covey RN

## 2022-05-14 NOTE — Progress Notes (Signed)
PROGRESS NOTE    Brandy Meyer  OHY:[id number] DOB: 1956-11-05 DOA: 05/12/2022 PCP: Soundra Pilon, FNP    Chief Complaint  Patient presents with   Abnormal Labs    Brief Narrative:  Patient 66 year old female history of CAD, hypertension, hyperlipidemia, hypothyroidism recently started on Synthroid in May 2023 presented to the ED with chief complaint of generalized muscle aches.  Patient seen by PCP 1 day prior to admission noted to have elevated CK level of 5499 concerning for rhabdomyolysis patient sent to the ED.  Patient seen in the ED noted to have a leukocytosis, transaminitis, elevated CK level, large amount of hemoglobin noted on urine dipstick.  Patient given a 1 L bolus of IV fluids.  Patient placed on aggressive fluid resuscitation.  Patient admitted for nontraumatic rhabdomyolysis.   Assessment & Plan:   Principal Problem:   Rhabdomyolysis Active Problems:   Hyperlipidemia   AKI (acute kidney injury) (HCC)   Transaminitis   Leukocytosis   Hypokalemia   Essential hypertension   Hypothyroidism   First degree AV block  #1 acute nontraumatic rhabdomyolysis -Patient noted to have recently been diagnosed with hypothyroidism 2 weeks ago and started on Synthroid.  Patient also with history of hyperlipidemia on Crestor and ezetimibe for several years. -Likely secondary to hypothyroidism in the setting of statin use which increases risk of myopathy. -Patient noted to have a transaminitis. -CK levels trending down. -Statin currently held. -TSH within normal limits. -Continue Synthroid. -Patient with concerns of becoming volume overloaded at this time. -Decrease IV fluids to 100 cc an hour. -Lasix 40 mg IV every 12 hours x2 doses. -Strict I's and O's. -Daily weights. -Repeat labs in the AM. -Supportive care.  2.  AKI in the setting of acute rhabdomyolysis -Creatinine noted at 1.2 with a baseline of 0.6. -Urinalysis with large hemoglobin, nitrite negative, leukocytes  negative, protein negative. -Renal function improving with hydration. -Decrease IV fluid rate to 100 cc an hour due to concerns of volume overload at this time. -Lasix 40 mg IV every 12 hours x2 doses -Monitor urine output.  3.  Hypokalemia/hypomagnesemia -Magnesium at 1.5 this morning.   -Potassium at 3.5.   -Magnesium sulfate 4 g IV x1.   -K-Dur 40 mEq p.o. x1.   -Repeat labs in the AM.   4.  Transaminitis -Likely secondary to problem #1. -LFTs trending down with hydration -Decrease IV fluids as patient with concerns for volume overload. -Repeat labs in the morning.  5.  Leukocytosis -Likely reactive leukocytosis. -Patient afebrile. -No signs of acute infection. -Leukocytosis trending down. -Follow.  6.  Hypertension -. Stable. -Patient noted with first-degree AV block and as such beta-blocker on hold. -Outpatient follow-up.  7.  Hypothyroidism -TSH within normal limits. -Continue home regimen Synthroid.  8.  Hyperlipidemia -Patient with newly diagnosed hypothyroidism recently started on Synthroid. -On Crestor and Zetia for hyperlipidemia. -Patient now with a rhabdomyolysis in the setting of statin therapy and hypothyroidism. -DiscontinueD statin. -We will need repeat lipid panel in a few weeks. -Continue Zetia. -Will need to follow-up in the outpatient setting with advanced lipid disorders clinic which [facility] cardiology.  9.  New first-degree AV block on EKG -Likely could be secondary to hypothyroidism. -TSH within normal limits. -Continue to hold beta-blocker.    10.  Poor appetite/unintentional weight loss -Being followed up in the outpatient setting per PCP. -Noted to have had a colonoscopy done approximately a year ago however not an endoscopy done May need to follow-up with GI and PCP. -  Please ensure nutritional supplementation.    DVT prophylaxis: Lovenox Code Status: Full Family Communication: Updated patient.  Updated sister at bedside.   Disposition: Home when clinically improved.  Status is: Inpatient Remains inpatient appropriate because: Severity of illness   Consultants:  None  Procedures:  None  Antimicrobials:  None   Subjective: Patient sitting up in chair.  Complaining of some swelling in the hands and feet.  Denies any shortness of breath however sister at bedside states patient with some shortness of breath on exertion yesterday.  No chest pain.  No abdominal pain.  Good urine output.  Asking whether she will be able to work with therapy again today.   Objective: Vitals:   05/13/22 2150 05/13/22 2210 05/14/22 0203 05/14/22 0914  BP: 132/71 102/75 110/69 129/72  Pulse: 95 99 100 95  Resp: 16 18 18    Temp: 97.8 F (36.6 C) 97.8 F (36.6 C) 98 F (36.7 C)   TempSrc: Oral Oral Oral   SpO2: 97% 99% 98% 98%  Weight:      Height:  5\' 1"  (1.549 m)      Intake/Output Summary (Last 24 hours) at 05/14/2022 1121 Last data filed at 05/14/2022 1056 Gross per 24 hour  Intake 4784.56 ml  Output 2550 ml  Net 2234.56 ml    Filed Weights   05/12/22 1803  Weight: 57 kg    Examination:  General exam: NAD. Respiratory system: CTA B.  No wheezes, no crackles, no rhonchi.  Fair air movement  Cardiovascular system: S1 & S2 heard, RRR. No JVD, murmurs, rubs, gallops or clicks.  1+ bilateral lower extremity edema. Gastrointestinal system: Abdomen is nondistended, soft and nontender. No organomegaly or masses felt. Normal bowel sounds heard. Central nervous system: Alert and oriented. No focal neurological deficits. Extremities: 1+ bilateral lower extremity edema.  Some swelling in the arms. Skin: No rashes, lesions or ulcers Psychiatry: Judgement and insight appear normal. Mood & affect appropriate.     Data Reviewed: I have personally reviewed following labs and imaging studies  CBC: Recent Labs  Lab 05/12/22 1823 05/12/22 1856 05/13/22 0428 05/14/22 0134  WBC 15.6*  --  13.9* 11.9*  NEUTROABS  5.5  --   --  4.4  HGB 10.3* 9.9* 9.5* 8.2*  HCT 30.0* 29.0* 28.7* 25.3*  MCV 91.2  --  92.3 93.4  PLT 267  279  --  252 194     Basic Metabolic Panel: Recent Labs  Lab 05/12/22 1823 05/12/22 1856 05/13/22 0428 05/14/22 0134  NA 137 135 139 141  K 3.0* 3.0* 2.9* 3.5  CL 99  --  104 114*  CO2 27  --  25 19*  GLUCOSE 95  --  96 96  BUN 6*  --  8 7*  CREATININE 1.21*  --  1.13* 1.00  CALCIUM 9.2  --  8.4* 7.9*  MG 2.0  --   --  1.5*  PHOS 3.2  --   --   --      GFR: Estimated Creatinine Clearance: 41.8 mL/min (by C-G formula based on SCr of 1 mg/dL).  Liver Function Tests: Recent Labs  Lab 05/12/22 1823 05/13/22 0428 05/14/22 0134  AST 180* 118* 47*  ALT 227* 186* 112*  ALKPHOS 51 43 36*  BILITOT 1.3* 1.5* 1.3*  PROT 6.1* 5.5* 4.7*  ALBUMIN 3.5 3.3* 2.7*     CBG: No results for input(s): "GLUCAP" in the last 168 hours.   No results found for this or  any previous visit (from the past 240 hour(s)).       Radiology Studies: No results found.      Scheduled Meds:  aspirin EC  81 mg Oral QPM   enoxaparin (LOVENOX) injection  40 mg Subcutaneous Q24H   ezetimibe  10 mg Oral QPM   furosemide  40 mg Intravenous Q12H   levothyroxine  50 mcg Oral Daily   metoprolol succinate  12.5 mg Oral Daily   pantoprazole  40 mg Oral QPM   Continuous Infusions:  sodium chloride 125 mL/hr at 05/14/22 1045   magnesium sulfate bolus IVPB 4 g (05/14/22 0924)     LOS: 1 day    Time spent: 40 minutes    Ramiro Harvest, MD [facility]   To contact the attending provider between 7A-7P or the covering provider during after hours 7P-7A, please log into the web site [email or web address] and access using universal [place] password for that web site. If you do not have the password, please call the hospital operator.  05/14/2022, 11:21 AM

## 2022-05-14 NOTE — Progress Notes (Signed)
Initial Nutrition Assessment  DOCUMENTATION CODES:   Not applicable  INTERVENTION:  Encourage adequate PO intake Ensure Enlive po BID, each supplement provides 350 kcal and 20 grams of protein. MVI with minerals daily Check micronutrient labs: zinc  NUTRITION DIAGNOSIS:   Increased nutrient needs related to acute illness as evidenced by estimated needs.  GOAL:   Patient will meet greater than or equal to 90% of their needs  MONITOR:   PO intake, Supplement acceptance, Labs, Weight trends, I & O's  REASON FOR ASSESSMENT:   Malnutrition Screening Tool    ASSESSMENT:   Pt admitted from home with muscle aches, found to have rhabdomyolysis. PMH significant for CAD, HTN, HLD and hypothyroidism.  Food related allergies: shellfish  Spoke with pt and her sister. Pt was sitting up in chair at time of visit with lunch tray on table consisting of pork with gravy, sweet potatoes, collard greens, ice cream, milk, dinner roll and a banana. Given the smell of the meal, she did not think she was going to be able to eat anything except the banana, ice cream, roll and milk. For breakfast she recalls eating 5 spoon fulls of oatmeal, 1/2 OJ and milk. She endorses having a heightened sensitivity to smells as well as altered taste changes which has affected her appetite and PO intake. Pt had retired in September 2022 and this is when she began to notice her intake and appetite changing which she attributes to a change in routine. At home she recalls her intake having decreased to about 75% less than her usual intake. She rarely eats meat but has been able to eat an ear of corn, a [employer] burger, 1/2 side salad from [employer] PTA.   Meal completions:  100% x 2 recorded meals  Pt states that she was recently told she is Vitamin D deficient but had not been able to start a supplement PTA. Suspect zinc deficiency could be potential cause of altered taste and pt is agreeable to have lab test and  supplement as appropriate.   Although unable to confirm d/t limited wt documentation within the last year, pt endorses ~12 lb wt loss within the last year. Reporting a prior wt of 135 lbs. Per documented wt hx, pt noted to have had a 3% wt loss from 02/10/21-02/23/22 which is not clinically significant for time frame.   Edema: mild pitting BUE, mild pitting RLE  Medications: lasix, synthroid, protonix IV drips: NaCl @100ml /hr  Labs: BUN 7, Mg 1.5 (replacing), alkaline phosphatase 36, AST 47, ALT 112, total bilirubin 1.3, Vit B12 284 (WNL), anemia panel WNL  UOP: x24 hours I/O's: +14101ml since admission  NUTRITION - FOCUSED PHYSICAL EXAM:  Flowsheet Row Most Recent Value  Orbital Region No depletion  Upper Arm Region No depletion  Thoracic and Lumbar Region No depletion  Buccal Region No depletion  Temple Region No depletion  Clavicle Bone Region No depletion  Clavicle and Acromion Bone Region No depletion  Scapular Bone Region No depletion  Dorsal Hand No depletion  Patellar Region No depletion  Anterior Thigh Region No depletion  Posterior Calf Region No depletion  Edema (RD Assessment) Mild  [BUE, BLE]  Hair Reviewed  Eyes Other (Comment)  [watering]  Mouth Reviewed  Skin Reviewed  Nails Reviewed      Diet Order:   Diet Order             Diet Heart Room service appropriate? Yes; Fluid consistency: Thin  Diet effective now  EDUCATION NEEDS:   Education needs have been addressed  Skin:  Skin Assessment: Reviewed RN Assessment  Last BM:  6/14  Height:   Ht Readings from Last 1 Encounters:  05/13/22 5\' 1"  (1.549 m)    Weight:   Wt Readings from Last 1 Encounters:  05/12/22 57 kg   BMI:  Body mass index is 23.74 kg/m.  Estimated Nutritional Needs:   Kcal:  1400-1600  Protein:  70-85g  Fluid:  >/=1.5L  05/14/22, RDN, LDN Clinical Nutrition

## 2022-05-14 NOTE — Progress Notes (Signed)
[facility] night cross cover note:  I was notified by RN regarding patient's request for sleep aid as well as pain medication in the setting of low back pain as well as bilateral thigh soreness.  Per my chart review, including review of most recent rounding hospitalist progress note, this is a 66 year old female who was admitted on 05/13/2022 for acute rhabdomyolysis associated acute kidney injury.  I subsequently placed orders for prn IV Dilaudid as well as prn melatonin.     Newton Pigg, DO Hospitalist

## 2022-05-15 LAB — COMPREHENSIVE METABOLIC PANEL
ALT: 92 U/L — ABNORMAL HIGH (ref 0–44)
AST: 28 U/L (ref 15–41)
Albumin: 3.1 g/dL — ABNORMAL LOW (ref 3.5–5.0)
Alkaline Phosphatase: 39 U/L (ref 38–126)
Anion gap: 11 (ref 5–15)
BUN: 5 mg/dL — ABNORMAL LOW (ref 8–23)
CO2: 24 mmol/L (ref 22–32)
Calcium: 8.4 mg/dL — ABNORMAL LOW (ref 8.9–10.3)
Chloride: 104 mmol/L (ref 98–111)
Creatinine, Ser: 0.97 mg/dL (ref 0.44–1.00)
GFR, Estimated: >60 mL/min (ref >60)
Glucose, Bld: 100 mg/dL — ABNORMAL HIGH (ref 70–99)
Potassium: 2.9 mmol/L — ABNORMAL LOW (ref 3.5–5.1)
Sodium: 139 mmol/L (ref 135–145)
Total Bilirubin: 1.2 mg/dL (ref 0.3–1.2)
Total Protein: 5.4 g/dL — ABNORMAL LOW (ref 6.5–8.1)

## 2022-05-15 LAB — CBC WITH DIFFERENTIAL/PLATELET
Abs Immature Granulocytes: 0.02 10*3/uL (ref 0.00–0.07)
Basophils Absolute: 0.1 10*3/uL (ref 0.0–0.1)
Basophils Relative: 1 %
Eosinophils Absolute: 0.4 10*3/uL (ref 0.0–0.5)
Eosinophils Relative: 3 %
HCT: 26 % — ABNORMAL LOW (ref 36.0–46.0)
Hemoglobin: 8.6 g/dL — ABNORMAL LOW (ref 12.0–15.0)
Immature Granulocytes: 0 %
Lymphocytes Relative: 56 %
Lymphs Abs: 6.9 10*3/uL — ABNORMAL HIGH (ref 0.7–4.0)
MCH: 30.2 pg (ref 26.0–34.0)
MCHC: 33.1 g/dL (ref 30.0–36.0)
MCV: 91.2 fL (ref 80.0–100.0)
Monocytes Absolute: 0.7 10*3/uL (ref 0.1–1.0)
Monocytes Relative: 5 %
Neutro Abs: 4.3 10*3/uL (ref 1.7–7.7)
Neutrophils Relative %: 35 %
Platelets: 243 10*3/uL (ref 150–400)
RBC: 2.85 MIL/uL — ABNORMAL LOW (ref 3.87–5.11)
RDW: 14.3 % (ref 11.5–15.5)
WBC: 12.3 10*3/uL — ABNORMAL HIGH (ref 4.0–10.5)
nRBC: 0 % (ref 0.0–0.2)

## 2022-05-15 LAB — MAGNESIUM: Magnesium: 2.1 mg/dL (ref 1.7–2.4)

## 2022-05-15 LAB — CK: Total CK: 965 U/L — ABNORMAL HIGH (ref 38–234)

## 2022-05-15 LAB — PHOSPHORUS: Phosphorus: 2.3 mg/dL — ABNORMAL LOW (ref 2.5–4.6)

## 2022-05-15 MED ORDER — K PHOS MONO-SOD PHOS DI & MONO 155-852-130 MG PO TABS
500.0000 mg | ORAL_TABLET | Freq: Once | ORAL | Status: AC
  Administered 2022-05-15: 500 mg via ORAL
  Filled 2022-05-15: qty 2

## 2022-05-15 MED ORDER — POTASSIUM CHLORIDE 20 MEQ PO PACK
40.0000 meq | PACK | Freq: Once | ORAL | Status: AC
Start: 2022-05-15 — End: 2022-05-15
  Administered 2022-05-15: 40 meq via ORAL
  Filled 2022-05-15: qty 2

## 2022-05-15 NOTE — Progress Notes (Signed)
PROGRESS NOTE  Brandy Meyer  DOB: 1956/01/03  PCP: Kristen Loader, FNP UKG:[id number]  DOA: 05/12/2022  LOS: 2 days  Hospital Day: 4  Brief narrative: Brandy Meyer is a 66 y.o. female with PMH significant for HTN, HLD, CAD, hypothyroidism who was recently started on Synthroid in May 2023. Patient presented to the ED on 6/13 with complaint of generalized muscle aches. 6/12, she was seen by her PCP for the same complaint.  Labs showed CK of 5499, raising concern of rhabdomyolysis and as she was sent to the ED for further evaluation.  She was diagnosed with hypothyroidism 2 weeks ago and was started on Synthroid by her PCP.  Since then, she was experiencing progressively worsening soreness in her thighs, shoulders and upper back.  Urine looked clear.  She has been on Crestor for several years. Also reports loss of appetite and weight loss of about 10 to 12 pounds over the past year for which she was being evaluated by her PCP.  In the ED, creatinine was at 1.2 against a baseline of 0.6, CK level was elevated to 03/30/2000.  Large amount of hemoglobin was seen in urine dipstick. She was admitted to [facility] and started on IV hydration. See below for details  Subjective: Patient was seen and examined this morning.  Pleasant elderly Caucasian female. Sitting up in bed.  Not in distress.  Muscle soreness improving.  Her sister was on the phone. In last 24 hours, patient was noted to be volume overloaded and hence 2 doses of Lasix 40 mg IV were given.  Her IV fluid rate was also reduced from 200 mL/h to 100 mill per hour. Labs from this morning shows continued improvement in CK level to 965.  Principal Problem:   Rhabdomyolysis Active Problems:   Hyperlipidemia   AKI (acute kidney injury) ([place])   Transaminitis   Leukocytosis   Hypokalemia   Essential hypertension   Hypothyroidism   First degree AV block    Assessment and plan: Acute nontraumatic rhabdomyolysis -Presented  with CK level elevated to more than 5000.  Unclear etiology.  No history of trauma.   -Patient has been on Crestor for several years.  She was recently started on Synthroid for hypothyroidism.   -The working diagnosis of her is that she might have had rhabdomyolysis due to interaction between Synthroid and Crestor.  Crestor has been stopped.  Synthroid continued. -She was adequately hydrated with normal saline.  Because of the concern of volume overload yesterday, she was given 2 doses of IV Lasix.  I would completely stop IV fluid this morning and monitor for next 24 hours. -Muscle soreness gradually improving.  Repeat CK level tomorrow.  If improving without IV fluid, can discharge home tomorrow.  Patient and her sister are both comfortable with the plan. Recent Labs  Lab 05/12/22 1823 05/13/22 0428 05/14/22 0134 05/15/22 0254  CKTOTAL 5,101* 3,867* 1,864* 965*    AKI in the setting of acute rhabdomyolysis -Creatinine noted at 1.2 with a baseline of 0.6.  Urinalysis showed large amount of hemoglobin which probably is myoglobin due to rhabdomyolysis.  Renal function improved with IV fluid. Recent Labs    05/12/22 1823 05/13/22 0428 05/14/22 0134 05/15/22 0254  BUN 6* 8 7* 5*  CREATININE 1.21* 1.13* 1.00 0.97   Hypokalemia/hypomagnesemia/hypophosphatemia -Electrolytes were monitored and replenished.   -Labs this morning showed low potassium at 2.9 and low phosphorus level at 2.3.  Oral potassium chloride and potassium phosphate ordered.  Repeat labs tomorrow. Recent Labs  Lab 05/12/22 1823 05/12/22 1856 05/13/22 0428 05/14/22 0134 05/15/22 0254  K 3.0* 3.0* 2.9* 3.5 2.9*  MG 2.0  --   --  1.5* 2.1  PHOS 3.2  --   --   --  2.3*   Transaminitis -AST and ALT levels were elevated likely because of rhabdomyolysis and not from liver source.  Alk phos normal.  Bilirubin only mildly elevated. -Transaminases trending down steadily Recent Labs  Lab 05/12/22 1823 05/13/22 0428  05/14/22 0134 05/15/22 0254  AST 180* 118* 47* 28  ALT 227* 186* 112* 92*  ALKPHOS 51 43 36* 39  BILITOT 1.3* 1.5* 1.3* 1.2  PROT 6.1* 5.5* 4.7* 5.4*  ALBUMIN 3.5 3.3* 2.7* 3.1*   Leukocytosis -Likely reactive leukocytosis. -No fever, or other signs of infection. -WBC count gradually trending down. Recent Labs  Lab 05/12/22 1823 05/13/22 0428 05/14/22 0134 05/15/22 0254  WBC 15.6* 13.9* 11.9* 12.3*   Hypertension -Blood pressure stable.  Continue metoprolol succinate 12.5 mg daily  Hypothyroidism -TSH within normal limits. -Continue home regimen Synthroid. Recent Labs    05/13/22 0429  TSH 3.398   Hyperlipidemia -PTA, patient was not Crestor and Zetia for hyperlipidemia. -With the concern of rhabdomyolysis from interaction between Crestor and Synthroid, Crestor was stopped. -Continue Zetia.  Poor appetite/unintentional weight loss -Being followed up in the outpatient setting per PCP. -Noted to have had a colonoscopy done approximately a year ago however not an endoscopy done May need to follow-up with GI and PCP. -Please ensure nutritional supplementation  Chronic anemia -Hemoglobin has been between 9 and 10 mostly for at least last 1 year.  No evidence of bleeding.  Colonoscopy about a year ago, with no significant finding per patient.  Slight drop in hemoglobin level this hospitalization because of hemodilution from aggressive IV hydration.  No evidence of active bleeding. -Continue to monitor hemoglobin as an outpatient. Recent Labs    05/12/22 1856 05/13/22 0428 05/13/22 0429 05/13/22 0757 05/14/22 0134 05/14/22 1331 05/15/22 0254  HGB 9.9* 9.5*  --   --  8.2* 9.4* 8.6*  MCV  --  92.3  --   --  93.4  --  91.2  VITAMINB12  --   --  284  --   --   --   --   FOLATE  --   --   --  7.2  --   --   --   FERRITIN  --   --  129  --   --   --   --   TIBC  --   --  300  --   --   --   --   IRON  --   --  70  --   --   --   --     Goals of care   Code Status:  Full Code    Mobility: Encourage ambulation  Skin assessment:     Nutritional status:  Body mass index is 23.74 kg/m.  Nutrition Problem: Increased nutrient needs Etiology: acute illness Signs/Symptoms: estimated needs     Diet:  Diet Order             Diet Heart Room service appropriate? Yes; Fluid consistency: Thin  Diet effective now                   DVT prophylaxis:  enoxaparin (LOVENOX) injection 40 mg Start: 05/13/22 0800   Antimicrobials: None Fluid: None Consultants: None  Family Communication: Sister on the phone  Status is: Inpatient  Continue in-hospital care because: Rhabdomyolysis work-up and management Level of care: Telemetry Medical   Dispo: The patient is from: Home              Anticipated d/c is to: Hopefully home tomorrow              Patient currently is not medically stable to d/c.   Difficult to place patient No     Infusions:    Scheduled Meds:  aspirin EC  81 mg Oral QPM   enoxaparin (LOVENOX) injection  40 mg Subcutaneous Q24H   ezetimibe  10 mg Oral QPM   feeding supplement  237 mL Oral BID BM   levothyroxine  50 mcg Oral Daily   metoprolol succinate  12.5 mg Oral Daily   multivitamin with minerals  1 tablet Oral Daily   pantoprazole  40 mg Oral QPM   phosphorus  500 mg Oral Once    PRN meds: acetaminophen, fluticasone, HYDROmorphone (DILAUDID) injection, melatonin, naLOXone (NARCAN)  injection, oxyCODONE, polyvinyl alcohol   Antimicrobials: Anti-infectives (From admission, onward)    None       Objective: Vitals:   05/15/22 0443 05/15/22 0930  BP: 110/66 114/71  Pulse: 88 89  Resp: 18 17  Temp: 98.4 F (36.9 C) 98.7 F (37.1 C)  SpO2: 98% 97%    Intake/Output Summary (Last 24 hours) at 05/15/2022 1018 Last data filed at 05/15/2022 0553 Gross per 24 hour  Intake 360 ml  Output 5626 ml  Net -5266 ml   Filed Weights   05/12/22 1803  Weight: 57 kg   Weight change:  Body mass index is 23.74  kg/m.   Physical Exam: General exam: Pleasant, elderly Caucasian female.  Not in physical distress Skin: No rashes, lesions or ulcers. HEENT: Atraumatic, normocephalic, no obvious bleeding Lungs: Clear to auscultation bilaterally CVS: Regular rate and rhythm, no murmur GI/Abd soft, nontender, nondistended, bowel sound present CNS: Alert, awake, oriented x3 Psychiatry: Mood appropriate Extremities: No pedal edema, no calf tenderness  Data Review: I have personally reviewed the laboratory data and studies available.  F/u labs ordered Unresulted Labs (From admission, onward)     Start     Ordered   05/16/22 0500  CBC with Differential/Platelet  Tomorrow morning,   R        05/15/22 1011   05/16/22 0500  Comprehensive metabolic panel  Tomorrow morning,   R        05/15/22 1011   05/16/22 0500  CK  Tomorrow morning,   R        05/15/22 1011   05/14/22 1310  Zinc  Once,   AD        05/14/22 1310            Signed, Terrilee Croak, MD [facility] 05/15/2022

## 2022-05-15 NOTE — Progress Notes (Signed)
Physical Therapy Treatment Patient Details Name: Brandy Meyer MRN: [id number] DOB: [birth year] Today's Date: 05/15/2022   History of Present Illness Pt is a 66 y/o female admitted secondary to muscle aches and weakness. Found to have rhabdomyolysis. Workup pending. PMH includes CAD, HTN, PFO and CAD.    PT Comments    Pt progressing well towards goals. Continues to report muscle fatigue and slight pain, but tolerated mobility well. Requiring supervision for mobility with 1 mild LOB noted when turning. Reviewed seated and standing HEP with pt and gave handout. Current recommendations appropriate. Will continue to follow acutely.   Medbridge Access Code: [facility]     Recommendations for follow up therapy are one component of a multi-disciplinary discharge planning process, led by the attending physician.  Recommendations may be updated based on patient status, additional functional criteria and insurance authorization.  Follow Up Recommendations  No PT follow up     Assistance Recommended at Discharge Intermittent Supervision/Assistance  Patient can return home with the following Assistance with cooking/housework   Equipment Recommendations  None recommended by PT    Recommendations for Other Services       Precautions / Restrictions Precautions Precautions: Fall Restrictions Weight Bearing Restrictions: No     Mobility  Bed Mobility               General bed mobility comments: Sitting on EOB    Transfers Overall transfer level: Needs assistance Equipment used: None Transfers: Sit to/from Stand Sit to Stand: Supervision           General transfer comment: supervision for safety    Ambulation/Gait Ambulation/Gait assistance: Supervision Gait Distance (Feet): 200 Feet Assistive device: None Gait Pattern/deviations: Step-through pattern, Decreased stride length Gait velocity: Decreased     General Gait Details: Slow, and cautious throughout. Mild LOB  when turning, however, pt able to self correct. Increased muscle fatigue at end of gait.   Stairs             Wheelchair Mobility    Modified Rankin (Stroke Patients Only)       Balance Overall balance assessment: Mild deficits observed, not formally tested                                          Cognition Arousal/Alertness: Awake/alert Behavior During Therapy: WFL for tasks assessed/performed Overall Cognitive Status: Within Functional Limits for tasks assessed                                          Exercises General Exercises - Lower Extremity Long Arc Quad: AROM, Both, 5 reps, Seated Hip ABduction/ADduction: AROM, Both, 5 reps, Seated (pillow squeezes X5, resisited hip abduction X5) Hip Flexion/Marching: AROM, Both, 5 reps, Standing Mini-Sqauts: AROM, Both, 5 reps, Standing Other Exercises Other Exercises: standing hip extension with counter support X5 Other Exercises: Verbally reviewed knees to chest and trunk rotation in supine with pt.    General Comments        Pertinent Vitals/Pain Pain Assessment Pain Assessment: Faces Faces Pain Scale: Hurts little more Pain Location: bilateral thighs and back Pain Descriptors / Indicators: Guarding, Grimacing Pain Intervention(s): Limited activity within patient's tolerance, Monitored during session, Repositioned    Home Living  Prior Function            PT Goals (current goals can now be found in the care plan section) Acute Rehab PT Goals Patient Stated Goal: to go home PT Goal Formulation: With patient Time For Goal Achievement: 05/27/22 Potential to Achieve Goals: Good Progress towards PT goals: Progressing toward goals    Frequency    Min 2X/week      PT Plan Current plan remains appropriate    Co-evaluation              AM-PAC PT "6 Clicks" Mobility   Outcome Measure  Help needed turning from your back to your  side while in a flat bed without using bedrails?: None Help needed moving from lying on your back to sitting on the side of a flat bed without using bedrails?: None Help needed moving to and from a bed to a chair (including a wheelchair)?: A Little Help needed standing up from a chair using your arms (e.g., wheelchair or bedside chair)?: A Little Help needed to walk in hospital room?: A Little Help needed climbing 3-5 steps with a railing? : A Little 6 Click Score: 20    End of Session   Activity Tolerance: Patient tolerated treatment well Patient left: in chair;with call bell/phone within reach;with family/visitor present (in recliner in ED) Nurse Communication: Mobility status PT Visit Diagnosis: Other abnormalities of gait and mobility (R26.89);Muscle weakness (generalized) (M62.81)     Time: 5852-7782 PT Time Calculation (min) (ACUTE ONLY): 30 min  Charges:  $Gait Training: 8-22 mins $Therapeutic Exercise: 8-22 mins                     Cindee Salt, DPT  Acute Rehabilitation Services  Office: [phone]    Brandy Meyer 05/15/2022, 10:47 AM

## 2022-05-16 LAB — COMPREHENSIVE METABOLIC PANEL
ALT: 69 U/L — ABNORMAL HIGH (ref 0–44)
AST: 19 U/L (ref 15–41)
Albumin: 3 g/dL — ABNORMAL LOW (ref 3.5–5.0)
Alkaline Phosphatase: 38 U/L (ref 38–126)
Anion gap: 8 (ref 5–15)
BUN: 8 mg/dL (ref 8–23)
CO2: 26 mmol/L (ref 22–32)
Calcium: 9.1 mg/dL (ref 8.9–10.3)
Chloride: 105 mmol/L (ref 98–111)
Creatinine, Ser: 0.98 mg/dL (ref 0.44–1.00)
GFR, Estimated: >60 mL/min (ref >60)
Glucose, Bld: 101 mg/dL — ABNORMAL HIGH (ref 70–99)
Potassium: 4.3 mmol/L (ref 3.5–5.1)
Sodium: 139 mmol/L (ref 135–145)
Total Bilirubin: 1 mg/dL (ref 0.3–1.2)
Total Protein: 5.5 g/dL — ABNORMAL LOW (ref 6.5–8.1)

## 2022-05-16 LAB — CBC WITH DIFFERENTIAL/PLATELET
Abs Immature Granulocytes: 0.02 10*3/uL (ref 0.00–0.07)
Basophils Absolute: 0.1 10*3/uL (ref 0.0–0.1)
Basophils Relative: 1 %
Eosinophils Absolute: 0.4 10*3/uL (ref 0.0–0.5)
Eosinophils Relative: 3 %
HCT: 26.5 % — ABNORMAL LOW (ref 36.0–46.0)
Hemoglobin: 8.7 g/dL — ABNORMAL LOW (ref 12.0–15.0)
Immature Granulocytes: 0 %
Lymphocytes Relative: 63 %
Lymphs Abs: 7.7 10*3/uL — ABNORMAL HIGH (ref 0.7–4.0)
MCH: 30.6 pg (ref 26.0–34.0)
MCHC: 32.8 g/dL (ref 30.0–36.0)
MCV: 93.3 fL (ref 80.0–100.0)
Monocytes Absolute: 0.7 10*3/uL (ref 0.1–1.0)
Monocytes Relative: 5 %
Neutro Abs: 3.5 10*3/uL (ref 1.7–7.7)
Neutrophils Relative %: 28 %
Platelets: 239 10*3/uL (ref 150–400)
RBC: 2.84 MIL/uL — ABNORMAL LOW (ref 3.87–5.11)
RDW: 14.7 % (ref 11.5–15.5)
WBC Morphology: ABNORMAL
WBC: 12.4 10*3/uL — ABNORMAL HIGH (ref 4.0–10.5)
nRBC: 0 % (ref 0.0–0.2)

## 2022-05-16 LAB — CK: Total CK: 446 U/L — ABNORMAL HIGH (ref 38–234)

## 2022-05-16 MED ORDER — ENSURE ENLIVE PO LIQD: 237.0000 mL | Freq: Two times a day (BID) | ORAL | 12 refills | Status: DC

## 2022-05-16 NOTE — Progress Notes (Signed)
DISCHARGE NOTE HOME Brandy Meyer to be discharged Home per MD order. Discussed prescriptions and follow up appointments with the patient. Prescriptions given to patient; medication list explained in detail. Patient verbalized understanding.  Skin clean, dry and intact without evidence of skin break down, no evidence of skin tears noted. IV catheter discontinued intact. Site without signs and symptoms of complications. Dressing and pressure applied. Pt denies pain at the site currently. No complaints noted.  Patient free of lines, drains, and wounds.   An After Visit Summary (AVS) was printed and given to the patient. Patient escorted via wheelchair, and discharged home via private auto.  [name], RN

## 2022-05-16 NOTE — Discharge Summary (Signed)
Physician Discharge Summary  Brandy Meyer TTS:[id number] DOB: [birth year] DOA: 05/12/2022  PCP: Kristen Loader, FNP  Admit date: 05/12/2022 Discharge date: 05/16/2022  Admitted From: Home Discharge disposition: Home  Recommendations at discharge:  Adequate to hydrate yourself. Stop Crestor. Follow-up with PCP as an outpatient for repeat blood work in 1 to 2 weeks.  Brief narrative: Brandy Meyer is a 66 y.o. female with PMH significant for HTN, HLD, CAD, hypothyroidism who was recently started on Synthroid in May 2023. Patient presented to the ED on 6/13 with complaint of generalized muscle aches. 6/12, she was seen by her PCP for the same complaint.  Labs showed CK of 5499, raising concern of rhabdomyolysis and as she was sent to the ED for further evaluation.  She was diagnosed with hypothyroidism 2 weeks ago and was started on Synthroid by her PCP.  Since then, she was experiencing progressively worsening soreness in her thighs, shoulders and upper back.  Urine looked clear.  She has been on Crestor for several years. Also reports loss of appetite and weight loss of about 10 to 12 pounds over the past year for which she was being evaluated by her PCP.  In the ED, creatinine was at 1.2 against a baseline of 0.6, CK level was elevated to 03/30/2000.  Large amount of hemoglobin was seen in urine dipstick. She was admitted to [facility] and started on IV hydration. See below for details  Subjective: Patient was seen and examined this morning.  Pleasant elderly Caucasian female. Sitting up in bed.  Not in distress.  Muscle soreness improving.   Labs this morning with improving CK level despite no fluid for last 24 hours.  Principal Problem:   Rhabdomyolysis Active Problems:   Hyperlipidemia   AKI (acute kidney injury) ([place])   Transaminitis   Leukocytosis   Hypokalemia   Essential hypertension   Hypothyroidism   First degree AV block    Hospital course Acute  nontraumatic rhabdomyolysis -Presented with CK level elevated to more than 5000.  Unclear etiology.  No history of trauma.   -Patient has been on Crestor for several years.  She was recently started on Synthroid for hypothyroidism.   -The suspected etiology of her rhabdomyolysis is an interaction between Synthroid and Crestor.  Crestor has been stopped.  Synthroid continued. -She was adequately hydrated with normal saline.  Off IV fluid for last 24 hours.  CK level continues to downtrend. -Muscle soreness gradually improving.  Advised adequate hydration at home.  Tylenol as needed for pain. Recent Labs  Lab 05/12/22 1823 05/13/22 0428 05/14/22 0134 05/15/22 0254 05/16/22 0314  CKTOTAL 5,101* 3,867* 1,864* 965* 446*    AKI in the setting of acute rhabdomyolysis -Creatinine noted at 1.2 with a baseline of 0.6.  Urinalysis showed large amount of hemoglobin which probably is myoglobin due to rhabdomyolysis.  Renal function improved with IV fluid. Recent Labs    05/12/22 1823 05/13/22 0428 05/14/22 0134 05/15/22 0254 05/16/22 0314  BUN 6* 8 7* 5* 8  CREATININE 1.21* 1.13* 1.00 0.97 0.98   Hypokalemia/hypomagnesemia/hypophosphatemia -Electrolytes were monitored and replenished.   -Normal potassium level this morning. Recent Labs  Lab 05/12/22 1823 05/12/22 1856 05/13/22 0428 05/14/22 0134 05/15/22 0254 05/16/22 0314  K 3.0* 3.0* 2.9* 3.5 2.9* 4.3  MG 2.0  --   --  1.5* 2.1  --   PHOS 3.2  --   --   --  2.3*  --    Transaminitis -AST and  ALT levels were elevated likely because of rhabdomyolysis and not from liver source.  Alk phos normal.  Bilirubin only mildly elevated. -Transaminases trending down steadily Recent Labs  Lab 05/12/22 1823 05/13/22 0428 05/14/22 0134 05/15/22 0254 05/16/22 0314  AST 180* 118* 47* 28 19  ALT 227* 186* 112* 92* 69*  ALKPHOS 51 43 36* 39 38  BILITOT 1.3* 1.5* 1.3* 1.2 1.0  PROT 6.1* 5.5* 4.7* 5.4* 5.5*  ALBUMIN 3.5 3.3* 2.7* 3.1* 3.0*    Leukocytosis -Likely reactive leukocytosis. -No fever, or other signs of infection. -WBC count gradually trending down. Recent Labs  Lab 05/12/22 1823 05/13/22 0428 05/14/22 0134 05/15/22 0254 05/16/22 0314  WBC 15.6* 13.9* 11.9* 12.3* 12.4*   Hypertension -Blood pressure stable.  Continue metoprolol succinate daily.  Hypothyroidism -TSH within normal limits. -Continue home regimen Synthroid. Recent Labs    05/13/22 0429  TSH 3.398   Hyperlipidemia -PTA, patient was not on Crestor and Zetia for hyperlipidemia. -With the concern of rhabdomyolysis from interaction between Crestor and Synthroid, Crestor was stopped. -Continue Zetia.  Poor appetite/unintentional weight loss -Being followed up in the outpatient setting per PCP. -Noted to have had a colonoscopy done approximately a year ago however not an endoscopy done May need to follow-up with GI and PCP. -Please ensure nutritional supplementation  Chronic anemia -Hemoglobin has been between 9 and 10 mostly for at least last 1 year.  No evidence of bleeding.  Colonoscopy about a year ago, with no significant finding per patient.  Slight drop in hemoglobin level this hospitalization because of hemodilution from aggressive IV hydration.  No evidence of active bleeding. -Continue to monitor hemoglobin as an outpatient. Recent Labs    05/13/22 0428 05/13/22 0429 05/13/22 0757 05/14/22 0134 05/14/22 1331 05/15/22 0254 05/16/22 0314  HGB 9.5*  --   --  8.2* 9.4* 8.6* 8.7*  MCV 92.3  --   --  93.4  --  91.2 93.3  VITAMINB12  --  284  --   --   --   --   --   FOLATE  --   --  7.2  --   --   --   --   FERRITIN  --  129  --   --   --   --   --   TIBC  --  300  --   --   --   --   --   IRON  --  70  --   --   --   --   --     Goals of care   Code Status: Full Code    Mobility: Encourage ambulation  Skin assessment:     Nutritional status:  Body mass index is 23.74 kg/m.  Nutrition Problem: Increased nutrient  needs Etiology: acute illness Signs/Symptoms: estimated needs       Wounds:  - Incision (Closed) 10/11/15 Back Other (Comment) (Active)  Date First Assessed/Time First Assessed: 10/11/15 1112   Location: Back  Location Orientation: Other (Comment)    Assessments 10/11/2015 12:25 PM 10/12/2015  8:00 AM  Dressing Type Honeycomb Honeycomb  Dressing Intact;Dry;Clean Changed  Site / Wound Assessment Dressing in place / Unable to assess --  Margins -- Attached edges (approximated)  Closure -- Adhesive strips  Drainage Amount None None     No associated orders.     Incision (Closed) 03/30/17 Hip Left (Active)  Date First Assessed/Time First Assessed: 03/30/17 1445   Location: Hip  Location Orientation: Left  Assessments 03/30/2017  3:34 PM 04/01/2017  9:45 AM  Dressing Type Adhesive bandage Silver hydrofiber  Dressing Clean;Dry;Intact Clean;Dry;Intact  Site / Wound Assessment -- Dressing in place / Unable to assess  Drainage Amount -- None  Treatment -- Ice applied     No associated orders.    Discharge Exam:   Vitals:   05/15/22 0930 05/15/22 1641 05/15/22 2146 05/16/22 0401  BP: 114/71 114/67 115/65 (!) 104/59  Pulse: 89 88 88 85  Resp: _0 Temp: 98.7 F (37.1 C) 98.4 F (36.9 C) 98 F (36.7 C) 97.9 F (36.6 C)  TempSrc: Oral Oral    SpO2: 97% 100% 99% 98%  Weight:      Height:        Body mass index is 23.74 kg/m.  General exam: Pleasant, elderly Caucasian female.  Not in physical distress Skin: No rashes, lesions or ulcers. HEENT: Atraumatic, normocephalic, no obvious bleeding Lungs: Clear to auscultation bilaterally CVS: Regular rate and rhythm, no murmur GI/Abd soft, nontender, nondistended, bowel sound present CNS: Alert, awake, oriented x3 Psychiatry: Mood appropriate Extremities: No pedal edema, no calf tenderness  Follow ups:    Follow-up Information     Kristen Loader, FNP Follow up.   Specialty: Family Medicine Contact  information: [address] [place] [address] [phone]         Jettie Booze, MD .   Specialties: Cardiology, Radiology, Interventional Cardiology Contact information: [address] [place] [address] [phone]                 Discharge Instructions:   Discharge Instructions     Call MD for:  difficulty breathing, headache or visual disturbances   Complete by: As directed    Call MD for:  extreme fatigue   Complete by: As directed    Call MD for:  hives   Complete by: As directed    Call MD for:  persistant dizziness or light-headedness   Complete by: As directed    Call MD for:  persistant nausea and vomiting   Complete by: As directed    Call MD for:  severe uncontrolled pain   Complete by: As directed    Call MD for:  temperature >100.4   Complete by: As directed    Diet general   Complete by: As directed    Discharge instructions   Complete by: As directed    Recommendations at discharge:   Adequate to hydrate yourself.  Stop Crestor.  Follow-up with PCP as an outpatient for repeat blood work in 1 to 2 weeks.  General discharge instructions: Follow with Primary MD Kristen Loader, FNP in 7 days  Please request your PCP  to go over your hospital tests, procedures, radiology results at the follow up. Please get your medicines reviewed and adjusted.  Your PCP may decide to repeat certain labs or tests as needed. Do not drive, operate heavy machinery, perform activities at heights, swimming or participation in water activities or provide baby sitting services if your were admitted for syncope or siezures until you have seen by Primary MD or a Neurologist and advised to do so again. [place] Controlled Substance Reporting System database was reviewed. Do not drive, operate heavy machinery, perform activities at heights, swim, participate in water activities or provide baby-sitting services while on medications  for pain, sleep and mood until your outpatient physician has reevaluated you and advised to do so again.  You are strongly recommended to comply with the dose, frequency and duration of prescribed medications. Activity: As tolerated with Full fall precautions use walker/cane & assistance as needed Avoid using any recreational substances like cigarette, tobacco, alcohol, or non-prescribed drug. If you experience worsening of your admission symptoms, develop shortness of breath, life threatening emergency, suicidal or homicidal thoughts you must seek medical attention immediately by calling 911 or calling your MD immediately  if symptoms less severe. You must read complete instructions/literature along with all the possible adverse reactions/side effects for all the medicines you take and that have been prescribed to you. Take any new medicine only after you have completely understood and accepted all the possible adverse reactions/side effects.  Wear Seat belts while driving. You were cared for by a hospitalist during your hospital stay. If you have any questions about your discharge medications or the care you received while you were in the hospital after you are discharged, you can call the unit and ask to speak with the hospitalist or the covering physician. Once you are discharged, your primary care physician will handle any further medical issues. Please note that NO REFILLS for any discharge medications will be authorized once you are discharged, as it is imperative that you return to your primary care physician (or establish a relationship with a primary care physician if you do not have one).   Increase activity slowly   Complete by: As directed        Discharge Medications:   Allergies as of 05/16/2022       Reactions   Shellfish Allergy Hives, Shortness Of Breath, Swelling   Crab meat and lobster.  Can tolerate small doses of shrimp.    Effexor [venlafaxine] Other (See Comments)    Sleepy/hyper   Other Other (See Comments)   Penicillins Other (See Comments)   As a child. But recently tolerated Augmentin. Has patient had a PCN reaction causing immediate rash, facial/tongue/throat swelling, SOB or lightheadedness with hypotension: Unknown Has patient had a PCN reaction causing severe rash involving mucus membranes or skin necrosis: Unknown Has patient had a PCN reaction that required hospitalization: Yes Has patient had a PCN reaction occurring within the last 10 years: No If all of the above answers are "NO", then may proceed with Cephalosporin use.   Shellfish-derived Products Other (See Comments)   Venlafaxine Hcl Other (See Comments)        Medication List     STOP taking these medications    ibuprofen 200 MG tablet Commonly known as: ADVIL   rosuvastatin 40 MG tablet Commonly known as: CRESTOR       TAKE these medications    acetaminophen 160 MG/5ML elixir Commonly known as: TYLENOL Take 500 mg by mouth every 4 (four) hours as needed for fever or pain.   aspirin EC 81 MG tablet Take 1 tablet (81 mg total) by mouth daily. What changed: when to take this   ezetimibe 10 MG tablet Commonly known as: ZETIA Take 1 tablet (10 mg total) by mouth daily. What changed: when to take this   feeding supplement Liqd Take 237 mLs by mouth 2 (two) times daily between meals.   fluticasone 50 MCG/ACT nasal spray Commonly known as: FLONASE Place 2 sprays into both nostrils daily as needed for allergies.   levothyroxine 50 MCG tablet Commonly known as: SYNTHROID Take 50 mcg by mouth daily.   metoprolol succinate 25 MG 24 hr tablet Commonly known as: TOPROL-XL TAKE 1 TABLET BY MOUTH  DAILY What changed: when to take this   nitroGLYCERIN 0.4 MG SL tablet Commonly known as: NITROSTAT Place 1 tablet (0.4 mg total) under the tongue every 5 (five) minutes as needed for chest pain.   pantoprazole 40 MG tablet Commonly known as: PROTONIX Take 40 mg by  mouth every evening.   REFRESH TEARS OP Place 1 drop into both eyes 2 (two) times daily as needed (dry eyes).         The results of significant diagnostics from this hospitalization (including imaging, microbiology, ancillary and laboratory) are listed below for reference.    Procedures and Diagnostic Studies:   No results found.   Labs:   Basic Metabolic Panel: Recent Labs  Lab 05/12/22 1823 05/12/22 1856 05/13/22 0428 05/14/22 0134 05/15/22 0254 05/16/22 0314  NA 137 135 139 141 139 139  K 3.0* 3.0* 2.9* 3.5 2.9* 4.3  CL 99  --  104 114* 104 105  CO2 27  --  25 19* 24 26  GLUCOSE 95  --  96 96 100* 101*  BUN 6*  --  8 7* 5* 8  CREATININE 1.21*  --  1.13* 1.00 0.97 0.98  CALCIUM 9.2  --  8.4* 7.9* 8.4* 9.1  MG 2.0  --   --  1.5* 2.1  --   PHOS 3.2  --   --   --  2.3*  --    GFR Estimated Creatinine Clearance: 42.6 mL/min (by C-G formula based on SCr of 0.98 mg/dL). Liver Function Tests: Recent Labs  Lab 05/12/22 1823 05/13/22 0428 05/14/22 0134 05/15/22 0254 05/16/22 0314  AST 180* 118* 47* 28 19  ALT 227* 186* 112* 92* 69*  ALKPHOS 51 43 36* 39 38  BILITOT 1.3* 1.5* 1.3* 1.2 1.0  PROT 6.1* 5.5* 4.7* 5.4* 5.5*  ALBUMIN 3.5 3.3* 2.7* 3.1* 3.0*   No results for input(s): "LIPASE", "AMYLASE" in the last 168 hours. No results for input(s): "AMMONIA" in the last 168 hours. Coagulation profile Recent Labs  Lab 05/12/22 1823  INR 1.1    CBC: Recent Labs  Lab 05/12/22 1823 05/12/22 1856 05/13/22 0428 05/14/22 0134 05/14/22 1331 05/15/22 0254 05/16/22 0314  WBC 15.6*  --  13.9* 11.9*  --  12.3* 12.4*  NEUTROABS 5.5  --   --  4.4  --  4.3 3.5  HGB 10.3*   < > 9.5* 8.2* 9.4* 8.6* 8.7*  HCT 30.0*   < > 28.7* 25.3* 28.8* 26.0* 26.5*  MCV 91.2  --  92.3 93.4  --  91.2 93.3  PLT 267  279  --  252 194  --  243 239   < > = values in this interval not displayed.   Cardiac Enzymes: Recent Labs  Lab 05/12/22 1823 05/13/22 0428 05/14/22 0134  05/15/22 0254 05/16/22 0314  CKTOTAL 5,101* 3,867* 1,864* 965* 446*  CKMB 64.7*  --   --   --   --    BNP: Invalid input(s): "POCBNP" CBG: No results for input(s): "GLUCAP" in the last 168 hours. D-Dimer No results for input(s): "DDIMER" in the last 72 hours. Hgb A1c No results for input(s): "HGBA1C" in the last 72 hours. Lipid Profile No results for input(s): "CHOL", "HDL", "LDLCALC", "TRIG", "CHOLHDL", "LDLDIRECT" in the last 72 hours. Thyroid function studies No results for input(s): "TSH", "T4TOTAL", "T3FREE", "THYROIDAB" in the last 72 hours.  Invalid input(s): "FREET3" Anemia work up No results for input(s): "VITAMINB12", "FOLATE", "FERRITIN", "TIBC", "IRON", "RETICCTPCT" in the last 72  hours. Microbiology No results found for this or any previous visit (from the past 240 hour(s)).  Time coordinating discharge: 35 minutes  Signed: Jeanmarie Mccowen  [facility] 05/16/2022, 8:27 AM

## 2022-05-17 LAB — ZINC: Zinc: 59 ug/dL (ref 44–115)

## 2022-05-20 NOTE — Telephone Encounter (Signed)
Corky Crafts, MD to Me  Awilda Metro, RPH-CPP      05/20/22  4:24 PM I reviewed the records.  Can refer to Dr. Lafe Garin with [facility].    Aundra Millet, She had rhabdo after Synthroid was added to Crestor, which she was taking for years.  Any chance of getting her on some other lipid lowering therapy at some point?   Dennie Bible, please give her the [facility] PMD list

## 2022-05-21 DIAGNOSIS — I10 Essential (primary) hypertension: Secondary | ICD-10-CM | POA: Diagnosis not present

## 2022-05-21 DIAGNOSIS — M8588 Other specified disorders of bone density and structure, other site: Secondary | ICD-10-CM | POA: Diagnosis not present

## 2022-05-21 DIAGNOSIS — R7303 Prediabetes: Secondary | ICD-10-CM | POA: Diagnosis not present

## 2022-05-21 DIAGNOSIS — N1832 Chronic kidney disease, stage 3b: Secondary | ICD-10-CM | POA: Diagnosis not present

## 2022-05-21 DIAGNOSIS — M6282 Rhabdomyolysis: Secondary | ICD-10-CM | POA: Diagnosis not present

## 2022-05-22 DIAGNOSIS — E782 Mixed hyperlipidemia: Secondary | ICD-10-CM | POA: Diagnosis not present

## 2022-05-22 DIAGNOSIS — M6282 Rhabdomyolysis: Secondary | ICD-10-CM | POA: Diagnosis not present

## 2022-05-22 DIAGNOSIS — E039 Hypothyroidism, unspecified: Secondary | ICD-10-CM | POA: Diagnosis not present

## 2022-05-22 DIAGNOSIS — Z09 Encounter for follow-up examination after completed treatment for conditions other than malignant neoplasm: Secondary | ICD-10-CM | POA: Diagnosis not present

## 2022-05-25 ENCOUNTER — Other Ambulatory Visit: Payer: Self-pay | Admitting: Family Medicine

## 2022-05-25 DIAGNOSIS — E039 Hypothyroidism, unspecified: Secondary | ICD-10-CM

## 2022-05-27 ENCOUNTER — Ambulatory Visit
Admission: RE | Admit: 2022-05-27 | Discharge: 2022-05-27 | Disposition: A | Discharge status: Home / Self Care | Payer: Medicare Other | Source: Ambulatory Visit | Attending: Family Medicine | Admitting: Family Medicine

## 2022-05-27 DIAGNOSIS — E039 Hypothyroidism, unspecified: Secondary | ICD-10-CM

## 2022-05-27 DIAGNOSIS — E041 Nontoxic single thyroid nodule: Secondary | ICD-10-CM | POA: Diagnosis not present

## 2022-05-28 ENCOUNTER — Ambulatory Visit: Payer: Medicare Other

## 2022-06-17 ENCOUNTER — Ambulatory Visit (INDEPENDENT_AMBULATORY_CARE_PROVIDER_SITE_OTHER): Payer: Medicare Other | Admitting: Pharmacist

## 2022-06-17 DIAGNOSIS — M6282 Rhabdomyolysis: Secondary | ICD-10-CM

## 2022-06-17 DIAGNOSIS — E785 Hyperlipidemia, unspecified: Secondary | ICD-10-CM | POA: Diagnosis not present

## 2022-06-17 NOTE — Patient Instructions (Signed)
I will submit a prior authorization for Nexlizet once your labs come back.  Please call me at [phone] with any questions

## 2022-06-17 NOTE — Progress Notes (Signed)
Patient ID: [name]                 DOB: 06/26/1956                    MRN: [id number]     HPI: Brandy Meyer is a 66 y.o. female patient referred to lipid clinic by Dr. Eldridge Dace. PMH is significant for CAD coronary CTA 12/2018 with calcium score of 1315 and extensive plaque with nonobstructive by FFR.  Borderline significant FFR of the circumflex 0.87 mid circumflex and 0.80 distal circumflex 0.91 RCA nonsignificant in the LAD.  Rhabdomyolysis from rosuvastatin (CK 5499) in 2023.   Patient presents today to lipid clinic. She was on atorvastatin prior to 2020, then changed to rosvuastatin and zetia. No issues until her recent hospitalization for rhabdo. Still taking zetia. Has both [employer] medicare (states is primary) and FedBCBS.   Current Medications: zetia 10mg  daily Intolerances: rosuvastatin (rhabdo) Risk Factors: premature CAD LDL goal: <55  Family History:  Family History  Problem Relation Age of Onset   Heart attack Mother    Lung cancer Father    Alcoholism Father     Social History:  Social History   Socioeconomic History   Marital status: Married    Spouse name: Not on file   Number of children: Not on file   Years of education: Not on file   Highest education level: Not on file  Occupational History   Not on file  Tobacco Use   Smoking status: Former   Smokeless tobacco: Never   Tobacco comments:    quit smoking 11 yrs ago  Vaping Use   Vaping Use: Never used  Substance and Sexual Activity   Alcohol use: Yes    Comment: social   Drug use: No   Sexual activity: Not on file  Other Topics Concern   Not on file  Social History Narrative   Not on file   Social Determinants of Health   Financial Resource Strain: Not on file  Food Insecurity: Not on file  Transportation Needs: Not on file  Physical Activity: Not on file  Stress: Not on file  Social Connections: Not on file  Intimate Partner Violence: Not on file     Labs: 09/04/20 TC 122, TG 117,  HDL 44, LDL-C 57 (rosuvastatin 20mg  daily, ezetimibe 10mg  daily)   Past Medical History:  Diagnosis Date   Anxiety    Arthritis    CAD in native artery    a. by coronary CT - medical therapy.   History of bronchitis    15 yrs ago   Hyperlipidemia    takes Atorvastatin daily   Hypertension    takes Metoprolol daily   Joint pain    Nocturia    Osteopenia    by DEXA scan at [facility] on January 30, 2009   PFO (patent foramen ovale)    a. small PFO by coronary CT (possible).   Pneumonia    hx of-20 yrs ago   Sinus tachycardia     Current Outpatient Medications on File Prior to Visit  Medication Sig Dispense Refill   acetaminophen (TYLENOL) 160 MG/5ML elixir Take 500 mg by mouth every 4 (four) hours as needed for fever or pain.     aspirin EC 81 MG tablet Take 1 tablet (81 mg total) by mouth daily. (Patient taking differently: Take 81 mg by mouth every evening.) 90 tablet 3   Carboxymethylcellulose Sodium (REFRESH TEARS OP) Place  1 drop into both eyes 2 (two) times daily as needed (dry eyes).     ezetimibe (ZETIA) 10 MG tablet Take 1 tablet (10 mg total) by mouth daily. (Patient taking differently: Take 10 mg by mouth every evening.) 90 tablet 3   feeding supplement (ENSURE ENLIVE / ENSURE PLUS) LIQD Take 237 mLs by mouth 2 (two) times daily between meals. 237 mL 12   fluticasone (FLONASE) 50 MCG/ACT nasal spray Place 2 sprays into both nostrils daily as needed for allergies.     levothyroxine (SYNTHROID) 50 MCG tablet Take 50 mcg by mouth daily.     metoprolol succinate (TOPROL-XL) 25 MG 24 hr tablet TAKE 1 TABLET BY MOUTH DAILY (Patient taking differently: Take 25 mg by mouth every evening.) 90 tablet 3   nitroGLYCERIN (NITROSTAT) 0.4 MG SL tablet Place 1 tablet (0.4 mg total) under the tongue every 5 (five) minutes as needed for chest pain. 25 tablet 6   pantoprazole (PROTONIX) 40 MG tablet Take 40 mg by mouth every evening.     No current facility-administered medications on file prior  to visit.    Allergies  Allergen Reactions   Shellfish Allergy Hives, Shortness Of Breath and Swelling    Crab meat and lobster.  Can tolerate small doses of shrimp.    Effexor [Venlafaxine] Other (See Comments)    Sleepy/hyper   Other Other (See Comments)   Penicillins Other (See Comments)    As a child. But recently tolerated Augmentin. Has patient had a PCN reaction causing immediate rash, facial/tongue/throat swelling, SOB or lightheadedness with hypotension: Unknown Has patient had a PCN reaction causing severe rash involving mucus membranes or skin necrosis: Unknown Has patient had a PCN reaction that required hospitalization: Yes Has patient had a PCN reaction occurring within the last 10 years: No If all of the above answers are "NO", then may proceed with Cephalosporin use.    Shellfish-Derived Products Other (See Comments)   Venlafaxine Hcl Other (See Comments)    Assessment/Plan:  1. Hyperlipidemia - LDL-C most likely > 55 off of statin. Due to hx of rhabdo, will not retrial statin. We discussed PCSK9i vs Nexlizet. Patient fearful of needles and would like to try Nexlizet first. I will submit prior authorization to [employer]BS as this is what I found on the [employer]BCBS website: Medicare Part A and Part B don't cover prescription drugs. If you decide to keep your FEP coverage, you don't need a supplemental drug plan, such as Medicare Part D.  This may patient should still be able to use copay card- or it at least wont push her into the coverage gap. I am checking LDL today so that I have an updated lab to submit to insurance. Will also check CK.    Thank you,   Olene Floss, Pharm.D, BCPS, CPP [facility]  [address] [place], [place] [address]  Phone: [phone]; Fax: [phone]

## 2022-06-18 ENCOUNTER — Telehealth: Payer: Self-pay | Admitting: Pharmacist

## 2022-06-18 LAB — CK: Total CK: 66 U/L (ref 32–182)

## 2022-06-18 LAB — LDL CHOLESTEROL, DIRECT: LDL Direct: 221 mg/dL — ABNORMAL HIGH (ref 0–99)

## 2022-06-18 NOTE — Telephone Encounter (Signed)
CK normal, LDL very elevated at 221. Her LDL-C was 57 in Oct 2021 on rosuvastatin 20mg  and zetia 10mg .  At visit yesterday, patient did not want to do a shot and preferred to go with oral option.  I called pt to discuss her lab results. LDL-C is very high. Do not think Nexletol is going to be enough. Discussed with patient seeing if her BCBS would pick up the 20% of Leqvio that medicare wont pay. Patient is agreement to assess all options. I have filled out start form online and patient will sign consent electronically to do a benefits investigation.

## 2022-06-23 ENCOUNTER — Encounter: Payer: Self-pay | Admitting: Pharmacist

## 2022-06-23 ENCOUNTER — Other Ambulatory Visit: Payer: Self-pay | Admitting: Pharmacist

## 2022-06-23 DIAGNOSIS — E785 Hyperlipidemia, unspecified: Secondary | ICD-10-CM

## 2022-06-23 NOTE — Telephone Encounter (Signed)
Patients medicare part B will pay for 80% of Leqvio and her [employer] plan will cover the remaining 20%. Patient made aware and would like to proceed with scheduling.  Pt scheduled for 7/31 @ 12:30

## 2022-06-29 ENCOUNTER — Encounter (HOSPITAL_COMMUNITY): Payer: Medicare Other

## 2022-06-30 ENCOUNTER — Ambulatory Visit (HOSPITAL_COMMUNITY)
Admission: RE | Admit: 2022-06-30 | Discharge: 2022-06-30 | Disposition: A | Discharge status: Home / Self Care | Payer: Medicare Other | Source: Ambulatory Visit | Attending: Interventional Cardiology | Admitting: Interventional Cardiology

## 2022-06-30 DIAGNOSIS — E785 Hyperlipidemia, unspecified: Secondary | ICD-10-CM | POA: Insufficient documentation

## 2022-06-30 MED ORDER — INCLISIRAN SODIUM 284 MG/1.5ML ~~LOC~~ SOSY: 284.0000 mg | PREFILLED_SYRINGE | Freq: Once | SUBCUTANEOUS | Status: AC

## 2022-06-30 MED ORDER — INCLISIRAN SODIUM 284 MG/1.5ML ~~LOC~~ SOSY
PREFILLED_SYRINGE | SUBCUTANEOUS | Status: AC
  Administered 2022-06-30: 284 mg via SUBCUTANEOUS
  Filled 2022-06-30: qty 1.5

## 2022-07-01 ENCOUNTER — Other Ambulatory Visit: Payer: Self-pay | Admitting: Pharmacist

## 2022-07-01 DIAGNOSIS — E785 Hyperlipidemia, unspecified: Secondary | ICD-10-CM

## 2022-09-30 ENCOUNTER — Ambulatory Visit (HOSPITAL_COMMUNITY)
Admission: RE | Admit: 2022-09-30 | Discharge: 2022-09-30 | Disposition: A | Discharge status: Home / Self Care | Payer: Medicare Other | Source: Ambulatory Visit | Attending: Interventional Cardiology | Admitting: Interventional Cardiology

## 2022-09-30 DIAGNOSIS — E785 Hyperlipidemia, unspecified: Secondary | ICD-10-CM | POA: Insufficient documentation

## 2022-09-30 MED ORDER — INCLISIRAN SODIUM 284 MG/1.5ML ~~LOC~~ SOSY
PREFILLED_SYRINGE | SUBCUTANEOUS | Status: AC
  Administered 2022-09-30: 284 mg via SUBCUTANEOUS
  Filled 2022-09-30: qty 1.5

## 2022-09-30 MED ORDER — INCLISIRAN SODIUM 284 MG/1.5ML ~~LOC~~ SOSY: 284.0000 mg | PREFILLED_SYRINGE | Freq: Once | SUBCUTANEOUS | Status: AC

## 2022-10-05 ENCOUNTER — Other Ambulatory Visit: Payer: Self-pay | Admitting: Pharmacist

## 2022-10-05 DIAGNOSIS — I2510 Atherosclerotic heart disease of native coronary artery without angina pectoris: Secondary | ICD-10-CM

## 2022-10-05 DIAGNOSIS — E785 Hyperlipidemia, unspecified: Secondary | ICD-10-CM

## 2022-10-08 DIAGNOSIS — M13832 Other specified arthritis, left wrist: Secondary | ICD-10-CM | POA: Diagnosis not present

## 2022-10-08 DIAGNOSIS — M13842 Other specified arthritis, left hand: Secondary | ICD-10-CM | POA: Diagnosis not present

## 2022-10-08 DIAGNOSIS — M25532 Pain in left wrist: Secondary | ICD-10-CM | POA: Diagnosis not present

## 2022-10-13 DIAGNOSIS — E782 Mixed hyperlipidemia: Secondary | ICD-10-CM | POA: Diagnosis not present

## 2022-11-19 ENCOUNTER — Encounter: Payer: Self-pay | Admitting: Interventional Cardiology

## 2022-11-19 DIAGNOSIS — U071 COVID-19: Secondary | ICD-10-CM | POA: Diagnosis not present

## 2022-12-04 DIAGNOSIS — J209 Acute bronchitis, unspecified: Secondary | ICD-10-CM | POA: Diagnosis not present

## 2023-03-24 DIAGNOSIS — Z1231 Encounter for screening mammogram for malignant neoplasm of breast: Secondary | ICD-10-CM | POA: Diagnosis not present

## 2023-03-31 ENCOUNTER — Encounter (HOSPITAL_COMMUNITY): Payer: Medicare Other

## 2023-04-05 DIAGNOSIS — E039 Hypothyroidism, unspecified: Secondary | ICD-10-CM | POA: Diagnosis not present

## 2023-04-05 DIAGNOSIS — D649 Anemia, unspecified: Secondary | ICD-10-CM | POA: Diagnosis not present

## 2023-04-05 DIAGNOSIS — R634 Abnormal weight loss: Secondary | ICD-10-CM | POA: Diagnosis not present

## 2023-04-05 DIAGNOSIS — R439 Unspecified disturbances of smell and taste: Secondary | ICD-10-CM | POA: Diagnosis not present

## 2023-04-05 DIAGNOSIS — R7303 Prediabetes: Secondary | ICD-10-CM | POA: Diagnosis not present

## 2023-04-05 DIAGNOSIS — R432 Parageusia: Secondary | ICD-10-CM | POA: Diagnosis not present

## 2023-04-05 DIAGNOSIS — K219 Gastro-esophageal reflux disease without esophagitis: Secondary | ICD-10-CM | POA: Diagnosis not present

## 2023-04-05 DIAGNOSIS — N1832 Chronic kidney disease, stage 3b: Secondary | ICD-10-CM | POA: Diagnosis not present

## 2023-04-05 DIAGNOSIS — M2550 Pain in unspecified joint: Secondary | ICD-10-CM | POA: Diagnosis not present

## 2023-04-05 DIAGNOSIS — E559 Vitamin D deficiency, unspecified: Secondary | ICD-10-CM | POA: Diagnosis not present

## 2023-04-05 DIAGNOSIS — E782 Mixed hyperlipidemia: Secondary | ICD-10-CM | POA: Diagnosis not present

## 2023-04-05 DIAGNOSIS — I2510 Atherosclerotic heart disease of native coronary artery without angina pectoris: Secondary | ICD-10-CM | POA: Diagnosis not present

## 2023-04-05 DIAGNOSIS — Z0000 Encounter for general adult medical examination without abnormal findings: Secondary | ICD-10-CM | POA: Diagnosis not present

## 2023-04-05 DIAGNOSIS — I10 Essential (primary) hypertension: Secondary | ICD-10-CM | POA: Diagnosis not present

## 2023-04-05 DIAGNOSIS — R682 Dry mouth, unspecified: Secondary | ICD-10-CM | POA: Diagnosis not present

## 2023-04-07 DIAGNOSIS — K219 Gastro-esophageal reflux disease without esophagitis: Secondary | ICD-10-CM | POA: Diagnosis not present

## 2023-04-09 ENCOUNTER — Telehealth: Payer: Self-pay | Admitting: Pharmacist

## 2023-04-09 ENCOUNTER — Other Ambulatory Visit: Payer: Self-pay | Admitting: Pharmacist

## 2023-04-09 NOTE — Telephone Encounter (Signed)
Called pt and LVM for her to call back. We are transitioning all Leqvio pt from the hospital to the [facility] infusion center. Pt due for injection 5/14. Referral placed for [facility] infusion center. Will need to cancel apt at hospital. Need to let pt know that [facility] will reach out to her to schedule.

## 2023-04-09 NOTE — Telephone Encounter (Signed)
Spoke with patient. She is in agreement with plan. Will cancel apt at hosptial. States she did blood work at PCP and cholesterol was high. Reviewed labs. LDL-C was 83, TG were high at 232 .HDL 107 We discussed what causes elevated TG. She does drink alcohol daily. Advised she decrease this along with sugar and refined carbs.  Confirmed she is also taking ezetimbie.  Will work on diet before adding medication for TG.

## 2023-04-13 ENCOUNTER — Encounter (HOSPITAL_COMMUNITY): Payer: Medicare Other

## 2023-04-14 NOTE — Telephone Encounter (Signed)
Patient called to follow up on referral to [facility]. I will make sure they received referral.

## 2023-04-15 ENCOUNTER — Telehealth: Payer: Self-pay | Admitting: Pharmacy Technician

## 2023-04-15 NOTE — Telephone Encounter (Signed)
Fyi note:  Auth Submission: NO AUTH NEEDED Site of care: Site of care: [facility] Payer:  Medicare a/b: no auth [facility] MEDICARE: approved: [id number] BCBS federal: no auth.  Rep: [facility] Medication & CPT/J Code(s) submitted: Leqvio (Inclisiran) [phone] Route of submission (phone, fax, portal):  Phone # Fax # Auth type: Buy/Bill Units/visits requested: 2 DOSES Reference number: [id number] Approval from: 04/15/23 to 04/14/24   Patient will be scheduled as soon as possible

## 2023-04-16 NOTE — Telephone Encounter (Signed)
My apologies for the late reply,   Yes we have received the referral. Patient will be scheduled as soon as possible.  Medicare: no auth needed BCBS Federal: no auth needed [facility] Medicare: approved  Patient will be scheduled as soon as possible

## 2023-04-18 NOTE — Progress Notes (Unsigned)
Cardiology Office Note   Date:  04/19/2023   ID:  Brandy, Meyer December 21, 1955, MRN [id number]  PCP:  Brandy Pilon, FNP    No chief complaint on file.  CAD  Wt Readings from Last 3 Encounters:  04/19/23 113 lb 9.6 oz (51.5 kg)  05/12/22 125 lb 10.6 oz (57 kg)  02/23/22 127 lb (57.6 kg)       History of Present Illness: Brandy Meyer is a 67 y.o. female   with history of CAD coronary CTA 12/2018 with calcium score of 1315 and extensive plaque with nonobstructive by FFR.  Borderline significant FFR of the circumflex 0.87 mid circumflex and 0.80 distal circumflex 0.91 RCA nonsignificant in the LAD.  Aggressive medical management recommended.  Also has hyperlipidemia.   In 01/30/2019, she was doing well without chest pain.  Atorvastatin was increased to 80 mg and zetia added and most recent LDL was at goal. Walks 1 mile/day. Has a fitbit that reminds her to walk.    Retired in 9/22.   2023: "She is enjoying retirement.  She has not been walking as much as she should."  Biggest complaint is loss of taste for several years.  Had COVID in 12/23. Walks regularly with her husband.  Denies : Chest pain. Dizziness. Leg edema. Nitroglycerin use. Orthopnea. Palpitations. Paroxysmal nocturnal dyspnea. Shortness of breath. Syncope.     Past Medical History:  Diagnosis Date   Anxiety    Arthritis    CAD in native artery    a. by coronary CT - medical therapy.   History of bronchitis    15 yrs ago   Hyperlipidemia    takes Atorvastatin daily   Hypertension    takes Metoprolol daily   Joint pain    Nocturia    Osteopenia    by DEXA scan at [facility] on January 30, 2009   PFO (patent foramen ovale)    a. small PFO by coronary CT (possible).   Pneumonia    hx of-20 yrs ago   Sinus tachycardia     Past Surgical History:  Procedure Laterality Date   BACK SURGERY     x  2   CESAREAN SECTION     COLONOSCOPY     LUNG SURGERY Left 2003   saw a spot on her lung , removed it,  benign   TOTAL HIP ARTHROPLASTY Left 03/30/2017   Procedure: LEFT TOTAL HIP ARTHROPLASTY ANTERIOR APPROACH;  Surgeon: Brandy Hitch, MD;  Location: [facility] OR;  Service: Orthopedics;  Laterality: Left;     Current Outpatient Medications  Medication Sig Dispense Refill   acetaminophen (TYLENOL) 160 MG/5ML elixir Take 500 mg by mouth every 4 (four) hours as needed for fever or pain.     aspirin EC 81 MG tablet Take 1 tablet (81 mg total) by mouth daily. (Patient taking differently: Take 81 mg by mouth every evening.) 90 tablet 3   Carboxymethylcellulose Sodium (REFRESH TEARS OP) Place 1 drop into both eyes 2 (two) times daily as needed (dry eyes).     ezetimibe (ZETIA) 10 MG tablet Take 1 tablet (10 mg total) by mouth daily. (Patient taking differently: Take 10 mg by mouth every evening.) 90 tablet 3   feeding supplement (ENSURE ENLIVE / ENSURE PLUS) LIQD Take 237 mLs by mouth 2 (two) times daily between meals. 237 mL 12   fluticasone (FLONASE) 50 MCG/ACT nasal spray Place 2 sprays into both nostrils daily as needed for allergies.  inclisiran (LEQVIO) 284 MG/1.5ML SOSY injection Inject 1.29ml at 0, 3 months and then every 6 months thereafter 1.5 mL 0   levothyroxine (SYNTHROID) 50 MCG tablet Take 50 mcg by mouth daily.     metoprolol succinate (TOPROL-XL) 25 MG 24 hr tablet TAKE 1 TABLET BY MOUTH DAILY (Patient taking differently: Take 25 mg by mouth every evening.) 90 tablet 3   nitroGLYCERIN (NITROSTAT) 0.4 MG SL tablet Place 1 tablet (0.4 mg total) under the tongue every 5 (five) minutes as needed for chest pain. 25 tablet 6   pantoprazole (PROTONIX) 40 MG tablet Take 40 mg by mouth every evening.     No current facility-administered medications for this visit.    Allergies:   Shellfish allergy, Effexor [venlafaxine], Other, Penicillins, Shellfish-derived products, and Venlafaxine hcl    Social History:  The patient  reports that she has quit smoking. She has never used smokeless  tobacco. She reports current alcohol use. She reports that she does not use drugs.   Family History:  The patient's family history includes Alcoholism in her father; Heart attack in her mother; Lung cancer in her father.    ROS:  Please see the history of present illness.   Otherwise, review of systems are positive for loss of taste.   All other systems are reviewed and negative.    PHYSICAL EXAM: VS:  BP 138/78   Pulse 81   Ht 5\' 1"  (1.549 m)   Wt 113 lb 9.6 oz (51.5 kg)   SpO2 95%   BMI 21.46 kg/m  , BMI Body mass index is 21.46 kg/m. GEN: Well nourished, well developed, in no acute distress HEENT: normal Neck: no JVD, carotid bruits, or masses Cardiac: RRR; no murmurs, rubs, or gallops,no edema  Respiratory:  clear to auscultation bilaterally, normal work of breathing GI: soft, nontender, nondistended, + BS MS: no deformity or atrophy Skin: warm and dry, no rash Neuro:  Strength and sensation are intact Psych: euthymic mood, full affect   EKG:   The ekg ordered today demonstrates normal ECG   Recent Labs: 05/13/2022: TSH 3.398 05/15/2022: Magnesium 2.1 05/16/2022: ALT 69; BUN 8; Creatinine, Ser 0.98; Hemoglobin 8.7; Platelets 239; Potassium 4.3; Sodium 139   Lipid Panel    Component Value Date/Time   CHOL 122 09/04/2020 0838   TRIG 117 09/04/2020 0838   HDL 44 09/04/2020 0838   CHOLHDL 2.8 09/04/2020 0838   CHOLHDL 4.3 04/15/2016 0841   VLDL 39 (H) 04/15/2016 0841   LDLCALC 57 09/04/2020 0838   LDLDIRECT 221 (H) 06/17/2022 1602   LDLDIRECT 127.0 09/05/2014 0851     Other studies Reviewed: Additional studies/ records that were reviewed today with results demonstrating: labs reviewed.   ASSESSMENT AND PLAN:  Coronary artery calcification: Medical therapy since her CTA in 12/2018.  No angina.  Continue aggressive secondary prevention.  HTN: Avoid excessive salt.  Borderline A1C 5.7%.  Drinking wine daily. Eating ice cream daily.  Decrease processed food intake.  She likes salads and greens.  High-fiber diet.  Exercise target noted below.  Check BPs at home.   Hyperlipidemia: Whole food plant based diet.  High-fiber diet. avoid processed foods.  TG elevated.  Is a little late on the Leqvio shot, was due on May 1.  LDL 83.  Triglycerides were 232.  Followed in lipid clinic.  Phone number given to her to expedite her benefits review which is required before her next Leqvio shot. Had increased Cr with ibuprofen in the past.  Current medicines are reviewed at length with the patient today.  The patient concerns regarding her medicines were addressed.  The following changes have been made:  No change  Labs/ tests ordered today include:  No orders of the defined types were placed in this encounter.   Recommend 150 minutes/week of aerobic exercise Low fat, low carb, high fiber diet recommended  Disposition:   FU in 1 year   Signed, Lance Muss, MD  04/19/2023 10:08 AM    [facility] [address] [place], [place], [place]  [address] Phone: [phone]; Fax: [phone]

## 2023-04-19 ENCOUNTER — Encounter: Payer: Self-pay | Admitting: Interventional Cardiology

## 2023-04-19 ENCOUNTER — Ambulatory Visit: Payer: Medicare Other | Attending: Interventional Cardiology | Admitting: Interventional Cardiology

## 2023-04-19 VITALS — BP 138/78 | HR 81 | Ht 61.0 in | Wt 113.6 lb

## 2023-04-19 DIAGNOSIS — E782 Mixed hyperlipidemia: Secondary | ICD-10-CM

## 2023-04-19 DIAGNOSIS — I2510 Atherosclerotic heart disease of native coronary artery without angina pectoris: Secondary | ICD-10-CM

## 2023-04-19 DIAGNOSIS — I10 Essential (primary) hypertension: Secondary | ICD-10-CM | POA: Diagnosis not present

## 2023-04-19 MED ORDER — EZETIMIBE 10 MG PO TABS: 10.0000 mg | ORAL_TABLET | Freq: Every day | ORAL | 3 refills | Status: DC

## 2023-04-19 MED ORDER — METOPROLOL SUCCINATE ER 25 MG PO TB24: 25.0000 mg | ORAL_TABLET | Freq: Every day | ORAL | 3 refills | Status: DC

## 2023-04-19 MED ORDER — NITROGLYCERIN 0.4 MG SL SUBL: 0.4000 mg | SUBLINGUAL_TABLET | SUBLINGUAL | 6 refills | Status: DC | PRN

## 2023-04-19 NOTE — Patient Instructions (Signed)
Medication Instructions:  Your physician recommends that you continue on your current medications as directed. Please refer to the Current Medication list given to you today.  *If you need a refill on your cardiac medications before your next appointment, please call your pharmacy*   Lab Work: none If you have labs (blood work) drawn today and your tests are completely normal, you will receive your results only by: MyChart Message (if you have MyChart) OR A paper copy in the mail If you have any lab test that is abnormal or we need to change your treatment, we will call you to review the results.   Testing/Procedures: none   Follow-Up: At [facility], you and your health needs are our priority.  As part of our continuing mission to provide you with exceptional heart care, we have created designated Provider Care Teams.  These Care Teams include your primary Cardiologist (physician) and Advanced Practice Providers (APPs -  Physician Assistants and Nurse Practitioners) who all work together to provide you with the care you need, when you need it.  We recommend signing up for the patient portal called "MyChart".  Sign up information is provided on this After Visit Summary.  MyChart is used to connect with patients for Virtual Visits (Telemedicine).  Patients are able to view lab/test results, encounter notes, upcoming appointments, etc.  Non-urgent messages can be sent to your provider as well.   To learn more about what you can do with MyChart, go to [email or web address].    Your next appointment:   12 month(s)  Provider:   Lance Muss, MD     Other Instructions  Number to call for benefits is [phone].  This is to assist with getting injection scheduled  High-Fiber Eating Plan Fiber, also called dietary fiber, is a type of carbohydrate. It is found foods such as fruits, vegetables, whole grains, and beans. A high-fiber diet can have many health benefits. Your  health care provider may recommend a high-fiber diet to help: Prevent constipation. Fiber can make your bowel movements more regular. Lower your cholesterol. Relieve the following conditions: Inflammation of veins in the anus (hemorrhoids). Inflammation of specific areas of the digestive tract (uncomplicated diverticulosis). A problem of the large intestine, also called the colon, that sometimes causes pain and diarrhea (irritable bowel syndrome, or IBS). Prevent overeating as part of a weight-loss plan. Prevent heart disease, type 2 diabetes, and certain cancers. What are tips for following this plan? Reading food labels  Check the nutrition facts label on food products for the amount of dietary fiber. Choose foods that have 5 grams of fiber or more per serving. The goals for recommended daily fiber intake include: Men (age 28 or younger): 34-38 g. Men (over age 68): 28-34 g. Women (age 64 or younger): 25-28 g. Women (over age 64): 22-25 g. Your daily fiber goal is _____________ g. Shopping Choose whole fruits and vegetables instead of processed forms, such as apple juice or applesauce. Choose a wide variety of high-fiber foods such as avocados, lentils, oats, and kidney beans. Read the nutrition facts label of the foods you choose. Be aware of foods with added fiber. These foods often have high sugar and sodium amounts per serving. Cooking Use whole-grain flour for baking and cooking. Cook with brown rice instead of white rice. Meal planning Start the day with a breakfast that is high in fiber, such as a cereal that contains 5 g of fiber or more per serving. Eat breads and cereals that  are made with whole-grain flour instead of refined flour or white flour. Eat brown rice, bulgur wheat, or millet instead of white rice. Use beans in place of meat in soups, salads, and pasta dishes. Be sure that half of the grains you eat each day are whole grains. General information You can get the  recommended daily intake of dietary fiber by: Eating a variety of fruits, vegetables, grains, nuts, and beans. Taking a fiber supplement if you are not able to take in enough fiber in your diet. It is better to get fiber through food than from a supplement. Gradually increase how much fiber you consume. If you increase your intake of dietary fiber too quickly, you may have bloating, cramping, or gas. Drink plenty of water to help you digest fiber. Choose high-fiber snacks, such as berries, raw vegetables, nuts, and popcorn. What foods should I eat? Fruits Berries. Pears. Apples. Oranges. Avocado. Prunes and raisins. Dried figs. Vegetables Sweet potatoes. Spinach. Kale. Artichokes. Cabbage. Broccoli. Cauliflower. Green peas. Carrots. Squash. Grains Whole-grain breads. Multigrain cereal. Oats and oatmeal. Brown rice. Barley. Bulgur wheat. Millet. Quinoa. Bran muffins. Popcorn. Rye wafer crackers. Meats and other proteins Navy beans, kidney beans, and pinto beans. Soybeans. Split peas. Lentils. Nuts and seeds. Dairy Fiber-fortified yogurt. Beverages Fiber-fortified soy milk. Fiber-fortified orange juice. Other foods Fiber bars. The items listed above may not be a complete list of recommended foods and beverages. Contact a dietitian for more information. What foods should I avoid? Fruits Fruit juice. Cooked, strained fruit. Vegetables Fried potatoes. Canned vegetables. Well-cooked vegetables. Grains White bread. Pasta made with refined flour. White rice. Meats and other proteins Fatty cuts of meat. Fried chicken or fried fish. Dairy Milk. Yogurt. Cream cheese. Sour cream. Fats and oils Butters. Beverages Soft drinks. Other foods Cakes and pastries. The items listed above may not be a complete list of foods and beverages to avoid. Talk with your dietitian about what choices are best for you. Summary Fiber is a type of carbohydrate. It is found in foods such as fruits, vegetables,  whole grains, and beans. A high-fiber diet has many benefits. It can help to prevent constipation, lower blood cholesterol, aid weight loss, and reduce your risk of heart disease, diabetes, and certain cancers. Increase your intake of fiber gradually. Increasing fiber too quickly may cause cramping, bloating, and gas. Drink plenty of water while you increase the amount of fiber you consume. The best sources of fiber include whole fruits and vegetables, whole grains, nuts, seeds, and beans. This information is not intended to replace advice given to you by your health care provider. Make sure you discuss any questions you have with your health care provider. Document Revised: 03/21/2020 Document Reviewed: 03/21/2020 Elsevier Patient Education  2023 [employer].

## 2023-04-19 NOTE — Addendum Note (Signed)
Addended by: Genice Rouge L on: 04/19/2023 10:30 AM   Modules accepted: Orders

## 2023-04-20 ENCOUNTER — Ambulatory Visit (INDEPENDENT_AMBULATORY_CARE_PROVIDER_SITE_OTHER): Payer: Medicare Other

## 2023-04-20 VITALS — BP 150/76 | HR 78 | Temp 98.0°F | Resp 16 | Ht 61.0 in | Wt 117.6 lb

## 2023-04-20 DIAGNOSIS — H26493 Other secondary cataract, bilateral: Secondary | ICD-10-CM | POA: Diagnosis not present

## 2023-04-20 DIAGNOSIS — E785 Hyperlipidemia, unspecified: Secondary | ICD-10-CM | POA: Diagnosis not present

## 2023-04-20 DIAGNOSIS — H18413 Arcus senilis, bilateral: Secondary | ICD-10-CM | POA: Diagnosis not present

## 2023-04-20 DIAGNOSIS — H02831 Dermatochalasis of right upper eyelid: Secondary | ICD-10-CM | POA: Diagnosis not present

## 2023-04-20 DIAGNOSIS — Z961 Presence of intraocular lens: Secondary | ICD-10-CM | POA: Diagnosis not present

## 2023-04-20 DIAGNOSIS — H26492 Other secondary cataract, left eye: Secondary | ICD-10-CM | POA: Diagnosis not present

## 2023-04-20 MED ORDER — INCLISIRAN SODIUM 284 MG/1.5ML ~~LOC~~ SOSY
284.0000 mg | PREFILLED_SYRINGE | Freq: Once | SUBCUTANEOUS | Status: AC
  Administered 2023-04-20: 284 mg via SUBCUTANEOUS
  Filled 2023-04-20: qty 1.5

## 2023-04-20 MED ORDER — INCLISIRAN SODIUM 284 MG/1.5ML ~~LOC~~ SOSY: 284.0000 mg | PREFILLED_SYRINGE | Freq: Once | SUBCUTANEOUS | Status: DC

## 2023-04-20 NOTE — Progress Notes (Signed)
Diagnosis: Hyperlipidemia  Provider:  Mannam, Praveen MD  Procedure: Injection  Leqvio (inclisiran), Dose: 284 mg, Site: subcutaneous, Number of injections: 1  Post Care:  Patient discharged home  Discharge: Condition: Good, Destination: Home . AVS Provided  Performed by:  Adrina Armijo A Ranen Doolin, RN       

## 2023-06-30 ENCOUNTER — Ambulatory Visit: Payer: Medicare Other | Admitting: Interventional Cardiology

## 2023-06-30 DIAGNOSIS — R234 Changes in skin texture: Secondary | ICD-10-CM | POA: Diagnosis not present

## 2023-06-30 DIAGNOSIS — H6121 Impacted cerumen, right ear: Secondary | ICD-10-CM | POA: Diagnosis not present

## 2023-07-05 DIAGNOSIS — L569 Acute skin change due to ultraviolet radiation, unspecified: Secondary | ICD-10-CM | POA: Diagnosis not present

## 2023-10-14 ENCOUNTER — Encounter (HOSPITAL_COMMUNITY): Payer: Medicare Other

## 2023-10-21 ENCOUNTER — Ambulatory Visit: Payer: Medicare Other

## 2023-11-09 ENCOUNTER — Ambulatory Visit: Payer: Medicare Other

## 2023-11-11 ENCOUNTER — Ambulatory Visit: Payer: Medicare Other

## 2023-11-11 VITALS — BP 142/79 | HR 89 | Temp 97.9°F | Resp 18 | Ht 60.0 in | Wt 114.4 lb

## 2023-11-11 DIAGNOSIS — E785 Hyperlipidemia, unspecified: Secondary | ICD-10-CM | POA: Diagnosis not present

## 2023-11-11 MED ORDER — INCLISIRAN SODIUM 284 MG/1.5ML ~~LOC~~ SOSY
284.0000 mg | PREFILLED_SYRINGE | Freq: Once | SUBCUTANEOUS | Status: AC
  Administered 2023-11-11: 284 mg via SUBCUTANEOUS
  Filled 2023-11-11: qty 1.5

## 2023-11-11 NOTE — Progress Notes (Signed)
Diagnosis: Hyperlipidemia  Provider:  Chilton Greathouse MD  Procedure: Injection  Leqvio (inclisiran), Dose: 284 mg, Site: subcutaneous, Number of injections: 1  Injection Site(s): Right arm  Post Care:  right arm injection  Discharge: Condition: Good, Destination: Home . AVS Provided  Performed by:  Rico Ala, LPN

## 2023-12-22 DIAGNOSIS — M13832 Other specified arthritis, left wrist: Secondary | ICD-10-CM | POA: Diagnosis not present

## 2023-12-22 DIAGNOSIS — M13842 Other specified arthritis, left hand: Secondary | ICD-10-CM | POA: Diagnosis not present

## 2024-01-06 ENCOUNTER — Encounter: Payer: Self-pay | Admitting: Interventional Cardiology

## 2024-03-01 DIAGNOSIS — M13842 Other specified arthritis, left hand: Secondary | ICD-10-CM | POA: Diagnosis not present

## 2024-03-01 DIAGNOSIS — M13832 Other specified arthritis, left wrist: Secondary | ICD-10-CM | POA: Diagnosis not present

## 2024-03-01 DIAGNOSIS — M67341 Transient synovitis, right hand: Secondary | ICD-10-CM | POA: Diagnosis not present

## 2024-03-01 DIAGNOSIS — M25531 Pain in right wrist: Secondary | ICD-10-CM | POA: Diagnosis not present

## 2024-04-07 DIAGNOSIS — R7303 Prediabetes: Secondary | ICD-10-CM | POA: Diagnosis not present

## 2024-04-07 DIAGNOSIS — I10 Essential (primary) hypertension: Secondary | ICD-10-CM | POA: Diagnosis not present

## 2024-04-07 DIAGNOSIS — K219 Gastro-esophageal reflux disease without esophagitis: Secondary | ICD-10-CM | POA: Diagnosis not present

## 2024-04-07 DIAGNOSIS — E559 Vitamin D deficiency, unspecified: Secondary | ICD-10-CM | POA: Diagnosis not present

## 2024-04-07 DIAGNOSIS — N1832 Chronic kidney disease, stage 3b: Secondary | ICD-10-CM | POA: Diagnosis not present

## 2024-04-07 DIAGNOSIS — E782 Mixed hyperlipidemia: Secondary | ICD-10-CM | POA: Diagnosis not present

## 2024-04-07 DIAGNOSIS — E039 Hypothyroidism, unspecified: Secondary | ICD-10-CM | POA: Diagnosis not present

## 2024-04-07 DIAGNOSIS — T466X5D Adverse effect of antihyperlipidemic and antiarteriosclerotic drugs, subsequent encounter: Secondary | ICD-10-CM | POA: Diagnosis not present

## 2024-04-07 DIAGNOSIS — Z0000 Encounter for general adult medical examination without abnormal findings: Secondary | ICD-10-CM | POA: Diagnosis not present

## 2024-04-07 DIAGNOSIS — M8588 Other specified disorders of bone density and structure, other site: Secondary | ICD-10-CM | POA: Diagnosis not present

## 2024-04-07 DIAGNOSIS — I2510 Atherosclerotic heart disease of native coronary artery without angina pectoris: Secondary | ICD-10-CM | POA: Diagnosis not present

## 2024-04-07 DIAGNOSIS — Z1231 Encounter for screening mammogram for malignant neoplasm of breast: Secondary | ICD-10-CM | POA: Diagnosis not present

## 2024-04-14 ENCOUNTER — Encounter: Payer: Self-pay | Admitting: Cardiology

## 2024-04-14 ENCOUNTER — Ambulatory Visit: Payer: Medicare Other | Attending: Cardiology | Admitting: Cardiology

## 2024-04-14 VITALS — BP 149/86 | HR 82 | Ht 61.0 in | Wt 121.2 lb

## 2024-04-14 DIAGNOSIS — G720 Drug-induced myopathy: Secondary | ICD-10-CM | POA: Diagnosis not present

## 2024-04-14 DIAGNOSIS — I2510 Atherosclerotic heart disease of native coronary artery without angina pectoris: Secondary | ICD-10-CM | POA: Diagnosis not present

## 2024-04-14 DIAGNOSIS — T466X5D Adverse effect of antihyperlipidemic and antiarteriosclerotic drugs, subsequent encounter: Secondary | ICD-10-CM | POA: Diagnosis not present

## 2024-04-14 DIAGNOSIS — E782 Mixed hyperlipidemia: Secondary | ICD-10-CM

## 2024-04-14 DIAGNOSIS — I10 Essential (primary) hypertension: Secondary | ICD-10-CM

## 2024-04-14 MED ORDER — OLMESARTAN MEDOXOMIL 20 MG PO TABS: 20.0000 mg | ORAL_TABLET | Freq: Every day | ORAL | 2 refills | Status: DC

## 2024-04-14 NOTE — Patient Instructions (Signed)
 Medication Instructions:  Your physician has recommended you make the following change in your medication:   1) START olmesartan (Benicar) 20 mg daily 2) STOP Leqvio --referral sent to Lipid Clinic to start Repatha  *If you need a refill on your cardiac medications before your next appointment, please call your pharmacy*  Lab Work: In 3 weeks (1st floor lab or any [facility]): BMET If you have labs (blood work) drawn today and your tests are completely normal, you will receive your results only by: MyChart Message (if you have MyChart) OR A paper copy in the mail If you have any lab test that is abnormal or we need to change your treatment, we will call you to review the results.  Follow-Up: At [facility], you and your health needs are our priority.  As part of our continuing mission to provide you with exceptional heart care, our providers are all part of one team.  This team includes your primary Cardiologist (physician) and Advanced Practice Providers or APPs (Physician Assistants and Nurse Practitioners) who all work together to provide you with the care you need, when you need it.  Your next appointment:   1 year(s)  The format for your next appointment:   In Person  Provider:   Knox Perl, MD{  We recommend signing up for the patient portal called "MyChart".  Sign up information is provided on this After Visit Summary.  MyChart is used to connect with patients for Virtual Visits (Telemedicine).  Patients are able to view lab/test results, encounter notes, upcoming appointments, etc.  Non-urgent messages can be sent to your provider as well.   To learn more about what you can do with MyChart, go to [email or web address].

## 2024-04-14 NOTE — Progress Notes (Signed)
 Cardiology Office Note:  .   Date:  04/14/2024  ID:  Brandy Meyer, DOB 01/08/1956, MRN [id number] PCP: Alejandro Hurt, FNP  [facility] Providers Cardiologist:  Avery Bodo, MD   History of Present Illness: .   Brandy Meyer is a 68 y.o. with history of CAD coronary CTA 12/2018 with calcium  score of 1315 and extensive plaque with nonobstructive by FFR.  Borderline significant FFR of the circumflex 0.87 mid circumflex and 0.80 distal circumflex 0.91 RCA nonsignificant in the LAD.  Aggressive medical management recommended.   Discussed the use of AI scribe software for clinical note transcription with the patient, who gave verbal consent to proceed.  History of Present Illness Brandy Meyer is a 68 year old female with coronary artery disease who presents for routine follow-up.  She has a significant coronary calcium  score of 1315, placing her in the 99th percentile. A CT angiogram in 2020 showed 30-60% blockages in the circumflex, LAD, and RCA. She is on Leqvio  (inclisiran) injections for cholesterol management after developing rhabdomyolysis from statins, with three to four doses received since July 2023.  She is asymptomatic.  Her mother had a massive heart attack at 32, which contributes to her vigilance regarding heart health. She quit smoking in 2007 and remains active throughout the day, though she does not engage in regular exercise. Her diet includes red meat, with increased consumption of chicken and fish.  She experiences no chest pain or shortness of breath. Her blood pressure is 149/86, but she does not regularly monitor it at home.  Labs   External Labs:  PCP labs on [facility] 04/07/2024:  Total cholesterol 236, triglycerides 250, HDL 107, LDL 114.  A1c 6.0%.  TSH normal at 1.520.  Hb 13.6, platelets 239,000.  Serum creatinine 0.980, potassium 4.7, EGFR 83 mL.  ROS   Review of Systems  Cardiovascular:  Negative for chest pain, dyspnea on exertion and leg  swelling.   Physical Exam:   VS:  BP (!) 149/86 (BP Location: Left Arm, Patient Position: Sitting, Cuff Size: Normal)   Pulse 82   Ht 5\' 1"  (1.549 m)   Wt 121 lb 3.2 oz (55 kg)   SpO2 95%   BMI 22.90 kg/m    Wt Readings from Last 3 Encounters:  04/14/24 121 lb 3.2 oz (55 kg)  11/11/23 114 lb 6.4 oz (51.9 kg)  04/20/23 117 lb 9.6 oz (53.3 kg)    Physical Exam Neck:     Vascular: No carotid bruit or JVD.  Cardiovascular:     Rate and Rhythm: Normal rate and regular rhythm.     Pulses: Intact distal pulses.     Heart sounds: Normal heart sounds. No murmur heard.    No gallop.  Pulmonary:     Effort: Pulmonary effort is normal.     Breath sounds: Normal breath sounds.  Abdominal:     General: Bowel sounds are normal.     Palpations: Abdomen is soft.  Musculoskeletal:     Right lower leg: No edema.     Left lower leg: No edema.    Studies Reviewed: .    coronary CTA 12/2018: Calcium  score of 1315 and extensive plaque with nonobstructive by FFR.  Borderline significant FFR of the circumflex 0.87 mid circumflex and 0.80 distal circumflex 0.91 RCA nonsignificant in the LAD.   EKG:    EKG Interpretation Date/Time:  Friday Apr 14 2024 11:09:04 EDT Ventricular Rate:  82 PR Interval:  160 QRS  Duration:  84 QT Interval:  346 QTC Calculation: 404 R Axis:   -15  Text Interpretation: EKG 04/14/2024: Normal sinus rhythm with rate of 82 bpm, leftward axis, poor R wave progression, cannot exclude anteroseptal infarct old.  Compared to 05/13/2022, poor R wave progression is new but previously noted in 2016. Confirmed by Mischa Pollard, Jagadeesh ([id number]) on 04/14/2024 11:57:41 AM    Medications and allergies    Allergies  Allergen Reactions   Shellfish Allergy Hives, Shortness Of Breath and Swelling    Crab meat and lobster.  Can tolerate small doses of shrimp.    Effexor [Venlafaxine] Other (See Comments)    Sleepy/hyper   Other Other (See Comments)   Penicillins Other (See Comments)     As a child. But recently tolerated Augmentin . Has patient had a PCN reaction causing immediate rash, facial/tongue/throat swelling, SOB or lightheadedness with hypotension: Unknown Has patient had a PCN reaction causing severe rash involving mucus membranes or skin necrosis: Unknown Has patient had a PCN reaction that required hospitalization: Yes Has patient had a PCN reaction occurring within the last 10 years: No If all of the above answers are "NO", then may proceed with Cephalosporin use.    Shellfish-Derived Products Other (See Comments)   Venlafaxine Hcl Other (See Comments)     Current Outpatient Medications:    acetaminophen  (TYLENOL ) 160 MG/5ML elixir, Take 500 mg by mouth every 4 (four) hours as needed for fever or pain., Disp: , Rfl:    aspirin  EC 81 MG tablet, Take 1 tablet (81 mg total) by mouth daily. (Patient taking differently: Take 81 mg by mouth every evening.), Disp: 90 tablet, Rfl: 3   Carboxymethylcellulose Sodium (REFRESH TEARS OP), Place 1 drop into both eyes 2 (two) times daily as needed (dry eyes)., Disp: , Rfl:    ezetimibe  (ZETIA ) 10 MG tablet, Take 1 tablet (10 mg total) by mouth daily., Disp: 90 tablet, Rfl: 3   fluticasone  (FLONASE ) 50 MCG/ACT nasal spray, Place 2 sprays into both nostrils daily as needed for allergies., Disp: , Rfl:    inclisiran (LEQVIO ) 284 MG/1.5ML SOSY injection, Inject 1.5ml at 0, 3 months and then every 6 months thereafter, Disp: 1.5 mL, Rfl: 0   metoprolol  succinate (TOPROL -XL) 25 MG 24 hr tablet, Take 1 tablet (25 mg total) by mouth daily., Disp: 90 tablet, Rfl: 3   nitroGLYCERIN  (NITROSTAT ) 0.4 MG SL tablet, Place 1 tablet (0.4 mg total) under the tongue every 5 (five) minutes as needed for chest pain., Disp: 25 tablet, Rfl: 6   pantoprazole  (PROTONIX ) 40 MG tablet, Take 40 mg by mouth every evening., Disp: , Rfl:    feeding supplement (ENSURE ENLIVE / ENSURE PLUS) LIQD, Take 237 mLs by mouth 2 (two) times daily between meals. (Patient  not taking: Reported on 04/14/2024), Disp: 237 mL, Rfl: 12   No orders of the defined types were placed in this encounter.    Medications Discontinued During This Encounter  Medication Reason   levothyroxine  (SYNTHROID ) 50 MCG tablet Patient Preference     ASSESSMENT AND PLAN: .      ICD-10-CM   1. Essential hypertension  I10 EKG 12-Lead    2. Coronary artery disease involving native coronary artery of native heart without angina pectoris  I25.10     3. Mixed hyperlipidemia  E78.2     4. Statin myopathy  G72.0    T46.6X5A       Assessment and Plan Assessment & Plan Coronary artery disease with elevated calcium   score   She has coronary artery disease with a calcium  score of 1315, placing her in the 99th percentile. A coronary CT angiogram in 2020 showed 30-60% stenosis in various coronary arteries, which is not hemodynamically significant. She reports no angina or other cardiac symptoms. Emphasize lifestyle modifications, particularly regular physical activity, to manage risk factors and prevent progression. Encourage daily walking and regular physical activity. Discontinue nitroglycerin  as it is unnecessary and likely expired.  Hyperlipidemia   Her hyperlipidemia is not well controlled on the current medication regimen. She experienced rhabdomyolysis with statin use previously and is currently on inclisiran (Leqvio ), but cholesterol levels remain suboptimal. Discussed switching to Repatha for better lipid control. Repatha is administered once a month using an auto-injector, which is less frequent and easier to manage than biweekly injections. Discontinue inclisiran (Leqvio ) and initiate Repatha, with preference for once a month administration using an auto-injector. Refer to a pharmacist for Repatha administration training and management.  Rhabdomyolysis   She experienced rhabdomyolysis secondary to statin use, leading to hospitalization. Currently managed with non-statin  lipid-lowering therapy.  Hypertension   She has hypertension with a recent blood pressure reading of 149/86 mmHg. Blood pressure management needs optimization. Initiate Benicar to better control blood pressure and monitor to ensure efficacy and tolerance. Order BMP in three weeks to monitor response to Benicar. If Benicar is tolerated, provide a 90-day supply. Refer to a pharmacist for blood pressure monitoring. I will see her back in a year and if she remains stable I will see her back on a as needed basis.  Signed,  Knox Perl, MD, [facility] 04/14/2024, 12:02 PM [facility] [address] [place], [place] [address] Phone: [phone]. Fax:  [phone]

## 2024-04-16 ENCOUNTER — Other Ambulatory Visit: Payer: Self-pay | Admitting: Interventional Cardiology

## 2024-05-12 ENCOUNTER — Ambulatory Visit: Payer: Medicare Other

## 2024-05-22 ENCOUNTER — Other Ambulatory Visit: Payer: Self-pay

## 2024-05-22 DIAGNOSIS — I10 Essential (primary) hypertension: Secondary | ICD-10-CM

## 2024-05-22 DIAGNOSIS — I2510 Atherosclerotic heart disease of native coronary artery without angina pectoris: Secondary | ICD-10-CM

## 2024-05-22 MED ORDER — OLMESARTAN MEDOXOMIL 20 MG PO TABS: 20.0000 mg | ORAL_TABLET | Freq: Every day | ORAL | 3 refills | Status: DC

## 2024-06-06 ENCOUNTER — Ambulatory Visit: Attending: Cardiology | Admitting: Pharmacist

## 2024-06-06 ENCOUNTER — Other Ambulatory Visit: Payer: Self-pay | Admitting: Pharmacist

## 2024-06-06 DIAGNOSIS — E785 Hyperlipidemia, unspecified: Secondary | ICD-10-CM

## 2024-06-06 DIAGNOSIS — I2510 Atherosclerotic heart disease of native coronary artery without angina pectoris: Secondary | ICD-10-CM

## 2024-06-06 NOTE — Patient Instructions (Addendum)
   [place] [facility] [phone] I will let you know when we are good from insurance perspective to schedule.

## 2024-06-06 NOTE — Assessment & Plan Note (Signed)
 Assessment: LDL-C is above goal of less than 70 LDL-C has slowly increased over the last year or so Her baseline LDL-C is 221 She is absolutely terrified of injections.  She would not be able to give herself injections nor let her husband do them every 2 weeks Patient originally thought I was giving her an injection today and was very anxious She is willing to continue Leqvio  injections Discussed the possibility of swapping her ezetimibe  out for Nexlizet.  We did review the side effects of this medication and the expected LDL-C reduction (20 to maybe 30%).  Plan: Resume Leqvio  injections-she was due in June for an injection.  No need to start over can continue getting every 6 months.  She would prefer to go to [facility].  I placed the order and asked the technicians to review her benefits. Will do prior authorization for Nexlizet

## 2024-06-06 NOTE — Progress Notes (Signed)
 Patient ID: Brandy Meyer                 DOB: [birth year]                    MRN: [id number]      HPI: Brandy Meyer is a 68 y.o. female patient referred to lipid clinic by Dr. Margaretann. PMH is significant for CAD coronary CTA 12/2018 with calcium  score of 1315 and extensive plaque with nonobstructive by FFR. Borderline significant FFR of the circumflex 0.87 mid circumflex and 0.80 distal circumflex 0.91 RCA nonsignificant in the LAD.  Rhabdomyolysis from rosuvastatin  (CK 5499) in 2023. Her baseline LDL-C is ~ 221. I saw her in clinic in July 2023 and she was started on Leqvio . LDL-C had initally come down to 70. In May of 2024 her TG were high at 232 and LDL-C was 83. She was counseled on dietary restrictions.  Labs from 04/07/24 showed Total cholesterol 236, triglycerides 250, HDL 107, LDL 114.   Patient presents today to discuss options.  She had been told that we could give her once monthly Repatha in the clinic.  However the once monthly injection has been discontinued and we do not have the capacity to administer PCSK9 in our clinic.  Although patient's LDL-C has trended upwards over the last year or 2, she still is about 50% lower than her baseline.  We did discuss some improvements in her diet that she could make including decreasing alcohol  intake and increasing fiber and reducing her fried food and red meat intake.  Discussed the possibility of swapping her ezetimibe  out for Nexlizet.  We did review the side effects of this medication and the expected LDL-C reduction (20 to maybe 30%). She does have both a Medicare plan and a federal Blue Cross Blue Shield commercial plan.  Current Medications: zetia  10mg  daily, Leqvio  q 6 months Intolerances: rosuvastatin  (rhabdo) Risk Factors: premature CAD LDL goal: <55 ApoB goal:   Diet:  Breakfast: skips most of the time (or pack of grits) Lunch: goes out- usually eats off kids menu (1 chicken tender), salad, cottage cheese, pizza once a  month Dinner: cooks most of the time, hamburger steak, chicken, pot roast Drink: water  Snack: rare chips  Exercise: walks 20 min daily, walks in pool  Family History:  Family History  Problem Relation Age of Onset   Heart attack Mother    Lung cancer Father    Alcoholism Father      Social History: 1 beer 4-5 x week, small amount of baileys in coffee daily, fireball twice a week  Labs: Lipid Panel     Component Value Date/Time   CHOL 122 09/04/2020 0838   TRIG 117 09/04/2020 0838   HDL 44 09/04/2020 0838   CHOLHDL 2.8 09/04/2020 0838   CHOLHDL 4.3 04/15/2016 0841   VLDL 39 (H) 04/15/2016 0841   LDLCALC 57 09/04/2020 0838   LDLDIRECT 221 (H) 06/17/2022 1602   LDLDIRECT 127.0 09/05/2014 0851   LABVLDL 21 09/04/2020 0838    Past Medical History:  Diagnosis Date   Anxiety    Arthritis    CAD in native artery    a. by coronary CT - medical therapy.   History of bronchitis    15 yrs ago   Hyperlipidemia    takes Atorvastatin  daily   Hypertension    takes Metoprolol  daily   Joint pain    Nocturia    Osteopenia    by DEXA  scan at [facility] on January 30, 2009   PFO (patent foramen ovale)    a. small PFO by coronary CT (possible).   Pneumonia    hx of-20 yrs ago   Sinus tachycardia     Current Outpatient Medications on File Prior to Visit  Medication Sig Dispense Refill   acetaminophen  (TYLENOL ) 160 MG/5ML elixir Take 500 mg by mouth every 4 (four) hours as needed for fever or pain.     aspirin  EC 81 MG tablet Take 1 tablet (81 mg total) by mouth daily. (Patient taking differently: Take 81 mg by mouth every evening.) 90 tablet 3   Carboxymethylcellulose Sodium (REFRESH TEARS OP) Place 1 drop into both eyes 2 (two) times daily as needed (dry eyes).     ezetimibe  (ZETIA ) 10 MG tablet TAKE 1 TABLET BY MOUTH DAILY 90 tablet 3   fluticasone  (FLONASE ) 50 MCG/ACT nasal spray Place 2 sprays into both nostrils daily as needed for allergies.     metoprolol  succinate (TOPROL -XL)  25 MG 24 hr tablet TAKE 1 TABLET BY MOUTH DAILY 90 tablet 3   olmesartan  (BENICAR ) 20 MG tablet Take 1 tablet (20 mg total) by mouth daily. 90 tablet 3   pantoprazole  (PROTONIX ) 40 MG tablet Take 40 mg by mouth every evening.     No current facility-administered medications on file prior to visit.    Allergies  Allergen Reactions   Shellfish Allergy Hives, Shortness Of Breath and Swelling    Crab meat and lobster.  Can tolerate small doses of shrimp.    Effexor [Venlafaxine] Other (See Comments)    Sleepy/hyper   Other Other (See Comments)   Penicillins Other (See Comments)    As a child. But recently tolerated Augmentin . Has patient had a PCN reaction causing immediate rash, facial/tongue/throat swelling, SOB or lightheadedness with hypotension: Unknown Has patient had a PCN reaction causing severe rash involving mucus membranes or skin necrosis: Unknown Has patient had a PCN reaction that required hospitalization: Yes Has patient had a PCN reaction occurring within the last 10 years: No If all of the above answers are NO, then may proceed with Cephalosporin use.    Shellfish-Derived Products Other (See Comments)   Venlafaxine Hcl Other (See Comments)    Assessment/Plan:  1. Hyperlipidemia -  Hyperlipidemia Assessment: LDL-C is above goal of less than 70 LDL-C has slowly increased over the last year or so Her baseline LDL-C is 221 She is absolutely terrified of injections.  She would not be able to give herself injections nor let her husband do them every 2 weeks Patient originally thought I was giving her an injection today and was very anxious She is willing to continue Leqvio  injections Discussed the possibility of swapping her ezetimibe  out for Nexlizet.  We did review the side effects of this medication and the expected LDL-C reduction (20 to maybe 30%).  Plan: Resume Leqvio  injections-she was due in June for an injection.  No need to start over can continue getting  every 6 months.  She would prefer to go to [facility].  I placed the order and asked the technicians to review her benefits. Will do prior authorization for Nexlizet    Thank you,  Kiffany Schelling D Nikos Anglemyer, Pharm.Brandy Meyer, CPP [facility] [address] [place], [place] [address]  Phone: [phone]; Fax: [phone]

## 2024-06-06 NOTE — Addendum Note (Signed)
 Addended by: Ramesses Crampton D on: 06/06/2024 04:20 PM   Modules accepted: Orders

## 2024-06-07 ENCOUNTER — Other Ambulatory Visit (HOSPITAL_COMMUNITY): Payer: Self-pay

## 2024-06-07 ENCOUNTER — Encounter: Payer: Self-pay | Admitting: Interventional Cardiology

## 2024-06-07 ENCOUNTER — Telehealth: Payer: Self-pay | Admitting: Pharmacy Technician

## 2024-06-07 NOTE — Telephone Encounter (Signed)
 Pharmacy Patient Advocate Encounter  Received notification from [employer] that Prior Authorization for nexlizet has been APPROVED from 06/07/24 to 12/08/24   mychart

## 2024-06-07 NOTE — Telephone Encounter (Signed)
 Pharmacy Patient Advocate Encounter   Received notification from cc chart-melissa that prior authorization for Nexlizet is required/requested.   Insurance verification completed.   The patient is insured through [facility] .   Per test claim: PA required; PA submitted to above mentioned insurance via latent Key/confirmation #/EOC [id number] Status is pending   latent

## 2024-06-08 ENCOUNTER — Other Ambulatory Visit (HOSPITAL_COMMUNITY): Payer: Self-pay

## 2024-06-08 NOTE — Telephone Encounter (Signed)
   It is 421.93 on [employer]ARP.  When running on aarp and federal employees it says missing invalid coverage code  When running with other coverage code of 02 says:  When running with other coverage code of 04 it says:  When running with over coverage code of 08 it says:

## 2024-06-09 ENCOUNTER — Other Ambulatory Visit (HOSPITAL_COMMUNITY): Payer: Self-pay

## 2024-06-09 ENCOUNTER — Encounter: Payer: Self-pay | Admitting: Interventional Cardiology

## 2024-06-09 ENCOUNTER — Telehealth: Payer: Self-pay | Admitting: Pharmacy Technician

## 2024-06-09 MED ORDER — NEXLIZET 180-10 MG PO TABS
1.0000 | ORAL_TABLET | Freq: Every day | ORAL | 11 refills | Status: DC
  Filled 2024-06-09 – 2024-06-12 (×2): qty 30, 30d supply, fill #0

## 2024-06-09 NOTE — Telephone Encounter (Signed)
 Pharmacy Patient Advocate Encounter   Received notification from pharmacy that prior authorization for nexlizet  is required/requested.   Insurance verification completed.   The patient is insured through [facility] .   Per test claim: PA required; PA submitted to above mentioned insurance via LATENT Key/confirmation #/EOC [id number] Status is pending      If secondary insurance pays then send eleanor a message to send prescription to :  If it does not, get healthwell grant and have [facility] mail rx to pt home: verified address   Call patient and let her know

## 2024-06-09 NOTE — Addendum Note (Signed)
 Addended by: Melville Engen D on: 06/09/2024 01:13 PM   Modules accepted: Orders

## 2024-06-12 ENCOUNTER — Other Ambulatory Visit (HOSPITAL_COMMUNITY): Payer: Self-pay

## 2024-06-12 ENCOUNTER — Telehealth: Payer: Self-pay | Admitting: Pharmacy Technician

## 2024-06-12 NOTE — Telephone Encounter (Signed)
 Auth Submission: APPROVED Site of care: Site of care: [facility] Payer: [facility] MEDICARE Medication & CPT/J Code(s) submitted: Leqvio  (Inclisiran) J1306 Diagnosis Code:  Route of submission (phone, fax, portal):  Phone # Fax # Auth type: Buy/Bill PB Units/visits requested: 284MG  Q6 MONTHS Reference wlfazm:[id number]  Approval from: 06/09/24 to 06/09/25

## 2024-06-12 NOTE — Telephone Encounter (Addendum)
 Hi, the patient said she would like this prescription sent to her normal pharmacy if this had a copay. The copay is 199 on both her insurances. Can you please send the nexlizet  to her preferred pharmacy? Thank you!         Pharmacy Patient Advocate Encounter  Received notification from [employer] that Prior Authorization for nexlizet  has been APPROVED from 06/09/24 to 06/09/25. Ran test claim, Copay is $199 on both insurances. This test claim was processed through [facility]- copay amounts may vary at other pharmacies due to pharmacy/plan contracts, or as the patient moves through the different stages of their insurance plan.   PA #/Case ID/Reference #: [id number]

## 2024-06-13 ENCOUNTER — Other Ambulatory Visit: Payer: Self-pay

## 2024-06-13 ENCOUNTER — Other Ambulatory Visit (HOSPITAL_COMMUNITY): Payer: Self-pay

## 2024-06-13 ENCOUNTER — Other Ambulatory Visit (HOSPITAL_BASED_OUTPATIENT_CLINIC_OR_DEPARTMENT_OTHER): Payer: Self-pay

## 2024-06-13 ENCOUNTER — Telehealth: Payer: Self-pay | Admitting: Pharmacy Technician

## 2024-06-13 ENCOUNTER — Encounter: Payer: Self-pay | Admitting: Interventional Cardiology

## 2024-06-13 MED ORDER — NEXLIZET 180-10 MG PO TABS
1.0000 | ORAL_TABLET | Freq: Every day | ORAL | 11 refills | Status: DC
Start: 2024-06-13 — End: 2024-06-14
  Filled 2024-06-13: qty 30, 30d supply, fill #0

## 2024-06-13 NOTE — Telephone Encounter (Signed)
 Was asked to sign up for copay card: Here is info

## 2024-06-13 NOTE — Addendum Note (Signed)
 Addended by: Keyara Ent D on: 06/13/2024 12:24 PM   Modules accepted: Orders

## 2024-06-14 ENCOUNTER — Encounter: Payer: Self-pay | Admitting: Interventional Cardiology

## 2024-06-14 ENCOUNTER — Telehealth: Payer: Self-pay

## 2024-06-14 ENCOUNTER — Other Ambulatory Visit (HOSPITAL_COMMUNITY): Payer: Self-pay

## 2024-06-14 MED ORDER — NEXLIZET 180-10 MG PO TABS
1.0000 | ORAL_TABLET | Freq: Every day | ORAL | 3 refills | Status: AC
  Filled 2024-06-14: qty 90, 90d supply, fill #0
  Filled 2024-11-07 – 2024-11-10 (×2): qty 90, 90d supply, fill #1

## 2024-06-14 NOTE — Telephone Encounter (Signed)
 Patient Advocate Encounter   The patient was approved for a [employer] grant that will help cover the cost of NEXLIZET  Total amount awarded, $2,500.  Effective: 05/15/24 - 05/14/25   APW:[id number] ERW:[id number] Hmnle:[id number] PI:[id number]   Pharmacy provided with approval and processing information (in [facility]).   Ileana Lehmann, CPhT  Pharmacy Patient Advocate Specialist  Direct Number: [phone] Fax: [phone]

## 2024-06-14 NOTE — Telephone Encounter (Signed)
 Enrolled in grant. Pharmacy provided with approval and processing information (in [place]).

## 2024-06-14 NOTE — Addendum Note (Signed)
 Addended by: Killian Ress D on: 06/14/2024 03:35 PM   Modules accepted: Orders

## 2024-06-16 ENCOUNTER — Encounter: Payer: Self-pay | Admitting: Pharmacist

## 2024-06-20 ENCOUNTER — Other Ambulatory Visit (HOSPITAL_COMMUNITY): Payer: Self-pay | Admitting: Interventional Cardiology

## 2024-06-20 ENCOUNTER — Telehealth (HOSPITAL_COMMUNITY): Payer: Self-pay | Admitting: Pharmacy Technician

## 2024-06-20 DIAGNOSIS — E785 Hyperlipidemia, unspecified: Secondary | ICD-10-CM

## 2024-06-20 NOTE — Telephone Encounter (Signed)
 Infusion Clinic Pharmacy Patient Advocate Encounter  Site of Care changed from [facility] to [facility] per pt request. PA submitted to update SOC.   Auth Submission: PENDING Site of care: [facility] Payer: [employer] Medicare Medication & CPT/J Code(s) submitted: Leqvio  (Inclisiran) J1306 Diagnosis Code: E78.5 Route of submission (phone, fax, portal): portal Phone # Fax # Auth type: Buy/Bill HB Units/visits requested: 284mg  x 2 doses, q 6 months Reference number: [id number] Appt: 06/28/24     Pt has BC[employer] FEP secondary that will cover any remaining balance.  Delsie Amador, CPhT [facility] Phone: [phone] 06/20/2024

## 2024-06-28 ENCOUNTER — Ambulatory Visit (HOSPITAL_COMMUNITY)
Admission: RE | Admit: 2024-06-28 | Discharge: 2024-06-28 | Disposition: A | Discharge status: Home / Self Care | Source: Ambulatory Visit | Attending: Cardiology | Admitting: Cardiology

## 2024-06-28 DIAGNOSIS — E785 Hyperlipidemia, unspecified: Secondary | ICD-10-CM | POA: Insufficient documentation

## 2024-06-28 DIAGNOSIS — M13842 Other specified arthritis, left hand: Secondary | ICD-10-CM | POA: Diagnosis not present

## 2024-06-28 DIAGNOSIS — M13832 Other specified arthritis, left wrist: Secondary | ICD-10-CM | POA: Diagnosis not present

## 2024-06-28 DIAGNOSIS — M67341 Transient synovitis, right hand: Secondary | ICD-10-CM | POA: Diagnosis not present

## 2024-06-28 MED ORDER — INCLISIRAN SODIUM 284 MG/1.5ML ~~LOC~~ SOSY
284.0000 mg | PREFILLED_SYRINGE | SUBCUTANEOUS | Status: DC
  Administered 2024-06-28: 284 mg via SUBCUTANEOUS

## 2024-06-28 MED ORDER — INCLISIRAN SODIUM 284 MG/1.5ML ~~LOC~~ SOSY: 284.0000 mg | PREFILLED_SYRINGE | SUBCUTANEOUS | Status: DC

## 2024-06-28 MED ORDER — INCLISIRAN SODIUM 284 MG/1.5ML ~~LOC~~ SOSY
PREFILLED_SYRINGE | SUBCUTANEOUS | Status: AC
  Filled 2024-06-28: qty 1.5

## 2024-07-10 DIAGNOSIS — K080 Exfoliation of teeth due to systemic causes: Secondary | ICD-10-CM | POA: Diagnosis not present

## 2024-07-15 DIAGNOSIS — R509 Fever, unspecified: Secondary | ICD-10-CM | POA: Diagnosis not present

## 2024-07-15 DIAGNOSIS — R251 Tremor, unspecified: Secondary | ICD-10-CM | POA: Diagnosis not present

## 2024-07-17 ENCOUNTER — Other Ambulatory Visit: Payer: Self-pay | Admitting: Cardiology

## 2024-07-17 DIAGNOSIS — I2510 Atherosclerotic heart disease of native coronary artery without angina pectoris: Secondary | ICD-10-CM

## 2024-07-17 DIAGNOSIS — I10 Essential (primary) hypertension: Secondary | ICD-10-CM

## 2024-07-25 DIAGNOSIS — D72829 Elevated white blood cell count, unspecified: Secondary | ICD-10-CM | POA: Diagnosis not present

## 2024-07-25 DIAGNOSIS — E878 Other disorders of electrolyte and fluid balance, not elsewhere classified: Secondary | ICD-10-CM | POA: Diagnosis not present

## 2024-07-25 DIAGNOSIS — E871 Hypo-osmolality and hyponatremia: Secondary | ICD-10-CM | POA: Diagnosis not present

## 2024-07-26 ENCOUNTER — Other Ambulatory Visit: Payer: Self-pay

## 2024-07-26 DIAGNOSIS — I10 Essential (primary) hypertension: Secondary | ICD-10-CM

## 2024-07-26 DIAGNOSIS — I2510 Atherosclerotic heart disease of native coronary artery without angina pectoris: Secondary | ICD-10-CM

## 2024-07-26 MED ORDER — OLMESARTAN MEDOXOMIL 20 MG PO TABS: 20.0000 mg | ORAL_TABLET | Freq: Every day | ORAL | 2 refills | Status: DC

## 2024-08-04 ENCOUNTER — Other Ambulatory Visit

## 2024-08-04 ENCOUNTER — Inpatient Hospital Stay

## 2024-08-04 ENCOUNTER — Encounter: Payer: Self-pay | Admitting: Hematology and Oncology

## 2024-08-04 ENCOUNTER — Inpatient Hospital Stay: Attending: Hematology and Oncology | Admitting: Hematology and Oncology

## 2024-08-04 VITALS — BP 142/70 | HR 79 | Temp 97.7°F | Resp 15 | Wt 116.3 lb

## 2024-08-04 DIAGNOSIS — R634 Abnormal weight loss: Secondary | ICD-10-CM | POA: Insufficient documentation

## 2024-08-04 DIAGNOSIS — E0590 Thyrotoxicosis, unspecified without thyrotoxic crisis or storm: Secondary | ICD-10-CM | POA: Diagnosis not present

## 2024-08-04 DIAGNOSIS — Z87891 Personal history of nicotine dependence: Secondary | ICD-10-CM | POA: Insufficient documentation

## 2024-08-04 DIAGNOSIS — Z801 Family history of malignant neoplasm of trachea, bronchus and lung: Secondary | ICD-10-CM | POA: Insufficient documentation

## 2024-08-04 DIAGNOSIS — R630 Anorexia: Secondary | ICD-10-CM | POA: Insufficient documentation

## 2024-08-04 DIAGNOSIS — D72820 Lymphocytosis (symptomatic): Secondary | ICD-10-CM | POA: Diagnosis not present

## 2024-08-04 LAB — CBC WITH DIFFERENTIAL/PLATELET
Abs Immature Granulocytes: 0.03 K/uL (ref 0.00–0.07)
Basophils Absolute: 0.1 K/uL (ref 0.0–0.1)
Basophils Relative: 1 %
Eosinophils Absolute: 0.1 K/uL (ref 0.0–0.5)
Eosinophils Relative: 1 %
HCT: 35.9 % — ABNORMAL LOW (ref 36.0–46.0)
Hemoglobin: 12.6 g/dL (ref 12.0–15.0)
Immature Granulocytes: 0 %
Lymphocytes Relative: 53 %
Lymphs Abs: 6.3 K/uL — ABNORMAL HIGH (ref 0.7–4.0)
MCH: 30 pg (ref 26.0–34.0)
MCHC: 35.1 g/dL (ref 30.0–36.0)
MCV: 85.5 fL (ref 80.0–100.0)
Monocytes Absolute: 0.5 K/uL (ref 0.1–1.0)
Monocytes Relative: 4 %
Neutro Abs: 4.9 K/uL (ref 1.7–7.7)
Neutrophils Relative %: 41 %
Platelets: 245 K/uL (ref 150–400)
RBC: 4.2 MIL/uL (ref 3.87–5.11)
RDW: 13.9 % (ref 11.5–15.5)
WBC: 11.8 K/uL — ABNORMAL HIGH (ref 4.0–10.5)
nRBC: 0 % (ref 0.0–0.2)

## 2024-08-04 LAB — CMP (CANCER CENTER ONLY)
ALT: 24 U/L (ref 0–44)
AST: 31 U/L (ref 15–41)
Albumin: 4.7 g/dL (ref 3.5–5.0)
Alkaline Phosphatase: 45 U/L (ref 38–126)
Anion gap: 9 (ref 5–15)
BUN: 5 mg/dL — ABNORMAL LOW (ref 8–23)
CO2: 28 mmol/L (ref 22–32)
Calcium: 9.2 mg/dL (ref 8.9–10.3)
Chloride: 94 mmol/L — ABNORMAL LOW (ref 98–111)
Creatinine: 0.74 mg/dL (ref 0.44–1.00)
GFR, Estimated: >60 mL/min (ref >60)
Glucose, Bld: 76 mg/dL (ref 70–99)
Potassium: 4 mmol/L (ref 3.5–5.1)
Sodium: 131 mmol/L — ABNORMAL LOW (ref 135–145)
Total Bilirubin: 1.1 mg/dL (ref 0.0–1.2)
Total Protein: 7.4 g/dL (ref 6.5–8.1)

## 2024-08-04 LAB — HEPATITIS PANEL, ACUTE
HCV Ab: NONREACTIVE
Hep A IgM: NONREACTIVE
Hep B C IgM: NONREACTIVE
Hepatitis B Surface Ag: NONREACTIVE

## 2024-08-04 LAB — LACTATE DEHYDROGENASE: LDH: 209 U/L — ABNORMAL HIGH (ref 98–192)

## 2024-08-04 NOTE — Progress Notes (Deleted)
 [facility] CONSULT NOTE  Patient Care Team: Marvene Prentice SAUNDERS, FNP as PCP - General (Family Medicine) Ladona Heinz, MD as PCP - Cardiology (Cardiology)  CHIEF COMPLAINTS/PURPOSE OF CONSULTATION:  ***  ASSESSMENT & PLAN:  No problem-specific Assessment & Plan notes found for this encounter.  No orders of the defined types were placed in this encounter.    HISTORY OF PRESENTING ILLNESS:  Brandy Meyer 68 y.o. female is here because of ***  REVIEW OF SYSTEMS:   Constitutional: Denies fevers, chills or abnormal night sweats Eyes: Denies blurriness of vision, double vision or watery eyes Ears, nose, mouth, throat, and face: Denies mucositis or sore throat Respiratory: Denies cough, dyspnea or wheezes Cardiovascular: Denies palpitation, chest discomfort or lower extremity swelling Gastrointestinal:  Denies nausea, heartburn or change in bowel habits Skin: Denies abnormal skin rashes Lymphatics: Denies new lymphadenopathy or easy bruising Neurological:Denies numbness, tingling or new weaknesses Behavioral/Psych: Mood is stable, no new changes  All other systems were reviewed with the patient and are negative.  MEDICAL HISTORY:  Past Medical History:  Diagnosis Date   Anxiety    Arthritis    CAD in native artery    a. by coronary CT - medical therapy.   History of bronchitis    15 yrs ago   Hyperlipidemia    takes Atorvastatin  daily   Hypertension    takes Metoprolol  daily   Joint pain    Nocturia    Osteopenia    by DEXA scan at [facility] on January 30, 2009   PFO (patent foramen ovale)    a. small PFO by coronary CT (possible).   Pneumonia    hx of-20 yrs ago   Sinus tachycardia     SURGICAL HISTORY: Past Surgical History:  Procedure Laterality Date   BACK SURGERY     x  2   CESAREAN SECTION     COLONOSCOPY     LUNG SURGERY Left 2003   saw a spot on her lung , removed it, benign   TOTAL HIP ARTHROPLASTY Left 03/30/2017   Procedure: LEFT TOTAL HIP  ARTHROPLASTY ANTERIOR APPROACH;  Surgeon: Lonni CINDERELLA Poli, MD;  Location: [facility] OR;  Service: Orthopedics;  Laterality: Left;    SOCIAL HISTORY: Social History   Socioeconomic History   Marital status: Married    Spouse name: Not on file   Number of children: Not on file   Years of education: Not on file   Highest education level: Not on file  Occupational History   Not on file  Tobacco Use   Smoking status: Former   Smokeless tobacco: Never   Tobacco comments:    quit smoking 11 yrs ago  Vaping Use   Vaping status: Never Used  Substance and Sexual Activity   Alcohol  use: Yes    Comment: social   Drug use: No   Sexual activity: Not on file  Other Topics Concern   Not on file  Social History Narrative   Not on file   Social Drivers of Health   Financial Resource Strain: Not on file  Food Insecurity: Not on file  Transportation Needs: Not on file  Physical Activity: Not on file  Stress: Not on file  Social Connections: Not on file  Intimate Partner Violence: Not on file    FAMILY HISTORY: Family History  Problem Relation Age of Onset   Heart attack Mother    Lung cancer Father    Alcoholism Father     ALLERGIES:  is allergic to shellfish allergy, effexor [venlafaxine], other, penicillins, shellfish-derived products, and venlafaxine hcl.  MEDICATIONS:  Current Outpatient Medications  Medication Sig Dispense Refill   Bempedoic Acid -Ezetimibe  (NEXLIZET ) 180-10 MG TABS Take 1 tablet by mouth daily. 90 tablet 3   Carboxymethylcellulose Sodium (REFRESH TEARS OP) Place 1 drop into both eyes 2 (two) times daily as needed (dry eyes).     fluticasone  (FLONASE ) 50 MCG/ACT nasal spray Place 2 sprays into both nostrils daily as needed for allergies.     inclisiran (LEQVIO ) 284 MG/1.5ML SOSY injection Inject 1.5 mLs (284 mg total) into the skin every 6 (six) months.     metoprolol  succinate (TOPROL -XL) 25 MG 24 hr tablet TAKE 1 TABLET BY MOUTH DAILY 90 tablet 3    olmesartan  (BENICAR ) 20 MG tablet Take 1 tablet (20 mg total) by mouth daily. 90 tablet 2   pantoprazole  (PROTONIX ) 40 MG tablet Take 40 mg by mouth every evening.     acetaminophen  (TYLENOL ) 160 MG/5ML elixir Take 500 mg by mouth every 4 (four) hours as needed for fever or pain.     aspirin  EC 81 MG tablet Take 1 tablet (81 mg total) by mouth daily. (Patient not taking: Reported on 08/04/2024) 90 tablet 3   No current facility-administered medications for this visit.     PHYSICAL EXAMINATION: ECOG PERFORMANCE STATUS: {CHL ONC ECOG ED:[id number]}  Vitals:   08/04/24 1031  BP: (!) 142/70  Pulse: 79  Resp: 15  Temp: 97.7 F (36.5 C)  SpO2: 97%   Filed Weights   08/04/24 1031  Weight: 116 lb 4.8 oz (52.8 kg)    GENERAL:alert, no distress and comfortable SKIN: skin color, texture, turgor are normal, no rashes or significant lesions EYES: normal, conjunctiva are pink and non-injected, sclera clear OROPHARYNX:no exudate, no erythema and lips, buccal mucosa, and tongue normal  NECK: supple, thyroid  normal size, non-tender, without nodularity LYMPH:  no palpable lymphadenopathy in the cervical, axillary or inguinal LUNGS: clear to auscultation and percussion with normal breathing effort HEART: regular rate & rhythm and no murmurs and no lower extremity edema ABDOMEN:abdomen soft, non-tender and normal bowel sounds Musculoskeletal:no cyanosis of digits and no clubbing  PSYCH: alert & oriented x 3 with fluent speech NEURO: no focal motor/sensory deficits  LABORATORY DATA:  I have reviewed the data as listed Lab Results  Component Value Date   WBC 12.4 (H) 05/16/2022   HGB 8.7 (L) 05/16/2022   HCT 26.5 (L) 05/16/2022   MCV 93.3 05/16/2022   PLT 239 05/16/2022     Chemistry      Component Value Date/Time   NA 139 05/16/2022 0314   NA 139 12/29/2018 1220   K 4.3 05/16/2022 0314   CL 105 05/16/2022 0314   CO2 26 05/16/2022 0314   BUN 8 05/16/2022 0314   BUN 10 12/29/2018  1220   CREATININE 0.98 05/16/2022 0314   CREATININE 0.65 04/15/2016 0841      Component Value Date/Time   CALCIUM  9.1 05/16/2022 0314   ALKPHOS 38 05/16/2022 0314   AST 19 05/16/2022 0314   ALT 69 (H) 05/16/2022 0314   BILITOT 1.0 05/16/2022 0314   BILITOT 0.8 08/16/2019 0844       RADIOGRAPHIC STUDIES: I have personally reviewed the radiological images as listed and agreed with the findings in the report. No results found.  All questions were answered. The patient knows to call the clinic with any problems, questions or concerns. I spent *** minutes in the care of  this patient including H and P, review of records, counseling and coordination of care.     Amber Stalls, MD 08/04/2024 10:51 AM

## 2024-08-04 NOTE — Progress Notes (Signed)
 [facility] CONSULT NOTE  Patient Care Team: Marvene Prentice SAUNDERS, FNP as PCP - General (Family Medicine) Ladona Heinz, MD as PCP - Cardiology (Cardiology)  CHIEF COMPLAINTS/PURPOSE OF CONSULTATION:  Lymphocytosis.  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 Differential includes potential drug hypersensitivity reaction, stress, polyclonal B cell lymphocytosis in people who smoke, asplenia, and some clonal B cell process including lymphomas and leukemia. - Order blood work to investigate potential bone marrow disorder. CBC, CMP, LDH and flow ordered. - Discuss results and potential treatment options at follow-up visit.  Hyperthyroidism Hyperthyroidism with previous episode of tremors and weakness possibly related to thyroid  dysfunction. - Update medication list in MyChart with current thyroid  medication.  Unintentional weight loss and decreased appetite Significant weight loss from 132 lbs to 116 lbs over three years, with decreased appetite and altered taste. Possible contributing factors include previous hospitalization for rhabdomyolysis and hyperthyroidism.  Patient will return to clinic in 2 weeks to discuss results and additional recommendations.   HISTORY OF PRESENTING ILLNESS:  Brandy Meyer 68 y.o. female is here because of lymphocytosis.  Discussed the use of AI scribe software for clinical note transcription with the patient, who gave verbal consent to proceed.  History of Present Illness Brandy Meyer is a 68 year old female who presents with lymphocytosis. She was referred by an urgent care physician for evaluation of elevated white blood cell count.  She has experienced elevated white blood cell counts for the past three years, with levels ranging from 14,000 to 19,000. An episode prompting a visit to urgent care involved  uncontrollable shaking of her hands, inability to write or hold a pen, and difficulty walking, lasting 24 to 36 hours. No definitive cause was identified.  She reports a significant decrease in appetite over the past few years, leading to weight loss from 132 pounds three years ago to 116 pounds currently. She experiences aversion to the smell and taste of food, often unable to eat more than a few bites, although she does not have trouble with ice cream. Her energy levels have decreased recently, with her taking two-hour naps daily, which is a new development.  Her past medical history includes hyperthyroidism, high cholesterol, high blood pressure, osteopenia, and anxiety. She underwent back surgery twice, lung surgery in 2003, and hip arthroplasty in 2018. She quit smoking in 2008 and only drinks socially.  No breathing difficulties, bowel movement issues, urinary problems, or recent falls. She has a history of osteoarthritis, for which she receives cortisone shots, but no diagnosis of rheumatoid arthritis.   All other systems were reviewed with the patient and are negative.  MEDICAL HISTORY:  Past Medical History:  Diagnosis Date   Anxiety    Arthritis    CAD in native artery    a. by coronary CT - medical therapy.   History of bronchitis    15 yrs ago   Hyperlipidemia    takes Atorvastatin  daily   Hypertension    takes Metoprolol  daily   Joint pain    Nocturia    Osteopenia    by DEXA scan at [facility] on January 30, 2009   PFO (patent foramen ovale)    a. small PFO by coronary CT (possible).   Pneumonia    hx of-20 yrs ago   Sinus tachycardia     SURGICAL HISTORY: Past Surgical History:  Procedure Laterality Date  BACK SURGERY     x  2   CESAREAN SECTION     COLONOSCOPY     LUNG SURGERY Left 2003   saw a spot on her lung , removed it, benign   TOTAL HIP ARTHROPLASTY Left 03/30/2017   Procedure: LEFT TOTAL HIP ARTHROPLASTY ANTERIOR APPROACH;  Surgeon: Lonni CINDERELLA Poli,  MD;  Location: [facility] OR;  Service: Orthopedics;  Laterality: Left;    SOCIAL HISTORY: Social History   Socioeconomic History   Marital status: Married    Spouse name: Not on file   Number of children: Not on file   Years of education: Not on file   Highest education level: Not on file  Occupational History   Not on file  Tobacco Use   Smoking status: Former   Smokeless tobacco: Never   Tobacco comments:    quit smoking 11 yrs ago  Vaping Use   Vaping status: Never Used  Substance and Sexual Activity   Alcohol  use: Yes    Comment: social   Drug use: No   Sexual activity: Not on file  Other Topics Concern   Not on file  Social History Narrative   Not on file   Social Drivers of Health   Financial Resource Strain: Not on file  Food Insecurity: Not on file  Transportation Needs: Not on file  Physical Activity: Not on file  Stress: Not on file  Social Connections: Not on file  Intimate Partner Violence: Not on file    FAMILY HISTORY: Family History  Problem Relation Age of Onset   Heart attack Mother    Lung cancer Father    Alcoholism Father     ALLERGIES:  is allergic to shellfish allergy, effexor [venlafaxine], other, penicillins, shellfish-derived products, and venlafaxine hcl.  MEDICATIONS:  Current Outpatient Medications  Medication Sig Dispense Refill   Bempedoic Acid -Ezetimibe  (NEXLIZET ) 180-10 MG TABS Take 1 tablet by mouth daily. 90 tablet 3   Carboxymethylcellulose Sodium (REFRESH TEARS OP) Place 1 drop into both eyes 2 (two) times daily as needed (dry eyes).     fluticasone  (FLONASE ) 50 MCG/ACT nasal spray Place 2 sprays into both nostrils daily as needed for allergies.     inclisiran (LEQVIO ) 284 MG/1.5ML SOSY injection Inject 1.5 mLs (284 mg total) into the skin every 6 (six) months.     metoprolol  succinate (TOPROL -XL) 25 MG 24 hr tablet TAKE 1 TABLET BY MOUTH DAILY 90 tablet 3   olmesartan  (BENICAR ) 20 MG tablet Take 1 tablet (20 mg total) by mouth  daily. 90 tablet 2   pantoprazole  (PROTONIX ) 40 MG tablet Take 40 mg by mouth every evening.     acetaminophen  (TYLENOL ) 160 MG/5ML elixir Take 500 mg by mouth every 4 (four) hours as needed for fever or pain.     aspirin  EC 81 MG tablet Take 1 tablet (81 mg total) by mouth daily. (Patient not taking: Reported on 08/04/2024) 90 tablet 3   No current facility-administered medications for this visit.     PHYSICAL EXAMINATION: ECOG PERFORMANCE STATUS: 0 - Asymptomatic  Vitals:   08/04/24 1031  BP: (!) 142/70  Pulse: 79  Resp: 15  Temp: 97.7 F (36.5 C)  SpO2: 97%   Filed Weights   08/04/24 1031  Weight: 116 lb 4.8 oz (52.8 kg)    GENERAL:alert, no distress and comfortable SKIN: skin color, texture, turgor are normal, no rashes or significant lesions EYES: normal, conjunctiva are pink and non-injected, sclera clear OROPHARYNX:no exudate, no erythema and  lips, buccal mucosa, and tongue normal  NECK: supple, thyroid  normal size, non-tender, without nodularity LYMPH:  no palpable lymphadenopathy in the cervical, axillary  LUNGS: clear to auscultation and percussion with normal breathing effort HEART: regular rate & rhythm and no murmurs and no lower extremity edema ABDOMEN:abdomen soft, non-tender and normal bowel sounds Musculoskeletal:no cyanosis of digits and no clubbing  PSYCH: alert & oriented x 3 with fluent speech NEURO: no focal motor/sensory deficits  LABORATORY DATA:  I have reviewed the data as listed Lab Results  Component Value Date   WBC 12.4 (H) 05/16/2022   HGB 8.7 (L) 05/16/2022   HCT 26.5 (L) 05/16/2022   MCV 93.3 05/16/2022   PLT 239 05/16/2022     Chemistry      Component Value Date/Time   NA 139 05/16/2022 0314   NA 139 12/29/2018 1220   K 4.3 05/16/2022 0314   CL 105 05/16/2022 0314   CO2 26 05/16/2022 0314   BUN 8 05/16/2022 0314   BUN 10 12/29/2018 1220   CREATININE 0.98 05/16/2022 0314   CREATININE 0.65 04/15/2016 0841      Component Value  Date/Time   CALCIUM  9.1 05/16/2022 0314   ALKPHOS 38 05/16/2022 0314   AST 19 05/16/2022 0314   ALT 69 (H) 05/16/2022 0314   BILITOT 1.0 05/16/2022 0314   BILITOT 0.8 08/16/2019 0844       RADIOGRAPHIC STUDIES: I have personally reviewed the radiological images as listed and agreed with the findings in the report. No results found.  All questions were answered. The patient knows to call the clinic with any problems, questions or concerns. I spent 45 minutes in the care of this patient including H and P, review of records, counseling and coordination of care.     Amber Stalls, MD 08/04/2024 10:52 AM

## 2024-08-07 LAB — SURGICAL PATHOLOGY

## 2024-08-08 LAB — FLOW CYTOMETRY

## 2024-08-09 ENCOUNTER — Telehealth: Payer: Self-pay

## 2024-08-09 NOTE — Telephone Encounter (Signed)
 Pt was seen in office with Dr Loretha 08/04/24 and is currently scheduled for f/u 08/18/24. She is interested in transferring care to Dr Timmy d/t distance and convenience. She verbalized that she loved meeting Dr Loretha, but that for convenience she would like to see Dr Timmy, as her husband also sees him. An email has been sent to our Teacher, English as a foreign language to inquire about how to facilitate this change, as well as Administrator. Pt is aware we are working on it and that she should receive a call soon.

## 2024-08-10 ENCOUNTER — Encounter: Payer: Self-pay | Admitting: *Deleted

## 2024-08-10 NOTE — Progress Notes (Signed)
 Patient is the wife of a current patient. She was initially worked up at [employer] but do to proximity to this office she would like to move care. She has sent a message to her [facility] team requesting referral.  Appointment scheduled with her while she is here with her husband.   Oncology Nurse Navigator Documentation     08/10/2024   10:30 AM  Oncology Nurse Navigator Flowsheets  Abnormal Finding Date 07/18/2024  Confirmed Diagnosis Date 08/03/2024  Diagnosis Status Confirmed Diagnosis Complete  Navigator Follow Up Date: 08/18/2024  Navigator Follow Up Reason: New Patient Appointment  Navigator Location [facility]  Referral Date to RadOnc/MedOnc 08/10/2024  Navigator Encounter Type Introductory Phone Call  Patient Visit Type MedOnc  Treatment Phase Pre-Tx/Tx Discussion  Barriers/Navigation Needs Coordination of Care;Education  Education Other  Interventions Coordination of Care;Education  Acuity Level 2-Minimal Needs (1-2 Barriers Identified)  Coordination of Care Appts  Education Method Verbal;Written  Support Groups/Services Friends and Family  Time Spent with Patient 30

## 2024-08-15 ENCOUNTER — Other Ambulatory Visit (HOSPITAL_COMMUNITY): Payer: Self-pay | Admitting: Interventional Cardiology

## 2024-08-17 ENCOUNTER — Encounter (HOSPITAL_COMMUNITY): Payer: Self-pay | Admitting: Interventional Cardiology

## 2024-08-18 ENCOUNTER — Ambulatory Visit: Admitting: Hematology and Oncology

## 2024-08-18 ENCOUNTER — Encounter: Payer: Self-pay | Admitting: Hematology & Oncology

## 2024-08-18 ENCOUNTER — Inpatient Hospital Stay (HOSPITAL_BASED_OUTPATIENT_CLINIC_OR_DEPARTMENT_OTHER): Admitting: Hematology & Oncology

## 2024-08-18 ENCOUNTER — Other Ambulatory Visit: Payer: Self-pay

## 2024-08-18 ENCOUNTER — Encounter: Payer: Self-pay | Admitting: *Deleted

## 2024-08-18 ENCOUNTER — Inpatient Hospital Stay

## 2024-08-18 VITALS — BP 113/69 | HR 78 | Temp 99.0°F | Resp 18 | Ht 61.0 in | Wt 115.0 lb

## 2024-08-18 DIAGNOSIS — R634 Abnormal weight loss: Secondary | ICD-10-CM | POA: Diagnosis not present

## 2024-08-18 DIAGNOSIS — D72820 Lymphocytosis (symptomatic): Secondary | ICD-10-CM

## 2024-08-18 DIAGNOSIS — E0590 Thyrotoxicosis, unspecified without thyrotoxic crisis or storm: Secondary | ICD-10-CM | POA: Diagnosis not present

## 2024-08-18 DIAGNOSIS — Z87891 Personal history of nicotine dependence: Secondary | ICD-10-CM | POA: Diagnosis not present

## 2024-08-18 DIAGNOSIS — Z801 Family history of malignant neoplasm of trachea, bronchus and lung: Secondary | ICD-10-CM | POA: Diagnosis not present

## 2024-08-18 LAB — CBC WITH DIFFERENTIAL (CANCER CENTER ONLY)
Abs Immature Granulocytes: 0.01 K/uL (ref 0.00–0.07)
Basophils Absolute: 0.1 K/uL (ref 0.0–0.1)
Basophils Relative: 1 %
Eosinophils Absolute: 0.1 K/uL (ref 0.0–0.5)
Eosinophils Relative: 1 %
HCT: 33.2 % — ABNORMAL LOW (ref 36.0–46.0)
Hemoglobin: 11.5 g/dL — ABNORMAL LOW (ref 12.0–15.0)
Immature Granulocytes: 0 %
Lymphocytes Relative: 63 %
Lymphs Abs: 6.1 K/uL — ABNORMAL HIGH (ref 0.7–4.0)
MCH: 30.6 pg (ref 26.0–34.0)
MCHC: 34.6 g/dL (ref 30.0–36.0)
MCV: 88.3 fL (ref 80.0–100.0)
Monocytes Absolute: 0.5 K/uL (ref 0.1–1.0)
Monocytes Relative: 5 %
Neutro Abs: 2.9 K/uL (ref 1.7–7.7)
Neutrophils Relative %: 30 %
Platelet Count: 241 K/uL (ref 150–400)
RBC: 3.76 MIL/uL — ABNORMAL LOW (ref 3.87–5.11)
RDW: 14.6 % (ref 11.5–15.5)
WBC Count: 10.3 K/uL (ref 4.0–10.5)
nRBC: 0 % (ref 0.0–0.2)

## 2024-08-18 LAB — CMP (CANCER CENTER ONLY)
ALT: 19 U/L (ref 0–44)
AST: 34 U/L (ref 15–41)
Albumin: 4.5 g/dL (ref 3.5–5.0)
Alkaline Phosphatase: 41 U/L (ref 38–126)
Anion gap: 15 (ref 5–15)
BUN: 6 mg/dL — ABNORMAL LOW (ref 8–23)
CO2: 24 mmol/L (ref 22–32)
Calcium: 9.3 mg/dL (ref 8.9–10.3)
Chloride: 95 mmol/L — ABNORMAL LOW (ref 98–111)
Creatinine: 0.82 mg/dL (ref 0.44–1.00)
GFR, Estimated: >60 mL/min (ref >60)
Glucose, Bld: 79 mg/dL (ref 70–99)
Potassium: 5.1 mmol/L (ref 3.5–5.1)
Sodium: 133 mmol/L — ABNORMAL LOW (ref 135–145)
Total Bilirubin: 0.8 mg/dL (ref 0.0–1.2)
Total Protein: 6.7 g/dL (ref 6.5–8.1)

## 2024-08-18 LAB — LACTATE DEHYDROGENASE: LDH: 236 U/L — ABNORMAL HIGH (ref 98–192)

## 2024-08-18 LAB — TSH: TSH: 0.415 u[IU]/mL (ref 0.350–4.500)

## 2024-08-18 LAB — SAVE SMEAR(SSMR), FOR PROVIDER SLIDE REVIEW

## 2024-08-18 NOTE — Progress Notes (Signed)
 Hematology and Oncology Follow Up Visit  Brandy Meyer [id number] 04-28-1956 68 y.o. 08/18/2024   Principle Diagnosis:  Monoclonal B-cell lymphocytosis  Current Therapy:   Observation     Interim History:  Brandy Meyer is in for her first office visit.  She actually was seen a couple weeks ago at the main cancer center.  She had presented with lymphocytosis.  She had a very thorough workup by Dr. Loretha.  She did a flow cytometry.  Flow cytometry did show a monoclonal B-cell population of cells.  There were CD5, CD19 and CD20 positive.  I had long talk with Brandy Meyer.  I had to take care of her husband who has lymphoma.  He has done very well with treatment.  I told her that I did not think that she had CLL.  I explained what CLL was I told her that there is a precursor stage for this.  She has had no palpable lymph nodes.  She has had some weight loss.  Her appetite is down a little bit.  She does have other health issues.  She does have hypothyroidism.  I am checking some immunoglobulins on her..  I think it probably would be worthwhile checking a CT scan on her to see if she has any adenopathy.  There is no change in bowel or bladder habits.  She has had no rashes.  She has had no bleeding.  She has had no leg swelling.  She has had no issues with COVID.   She does not smoke..    Overall, I would say that her performance status is probably ECOG 1.  Medications:  Current Outpatient Medications:    levothyroxine  (SYNTHROID ) 50 MCG tablet, Take 50 mcg by mouth daily., Disp: , Rfl:    aspirin  EC 81 MG tablet, Take 1 tablet (81 mg total) by mouth daily. (Patient not taking: Reported on 08/04/2024), Disp: 90 tablet, Rfl: 3   Bempedoic Acid -Ezetimibe  (NEXLIZET ) 180-10 MG TABS, Take 1 tablet by mouth daily., Disp: 90 tablet, Rfl: 3   Carboxymethylcellulose Sodium (REFRESH TEARS OP), Place 1 drop into both eyes 2 (two) times daily as needed (dry eyes)., Disp: , Rfl:    fluticasone   (FLONASE ) 50 MCG/ACT nasal spray, Place 2 sprays into both nostrils daily as needed for allergies., Disp: , Rfl:    inclisiran (LEQVIO ) 284 MG/1.5ML SOSY injection, Inject 1.5 mLs (284 mg total) into the skin every 6 (six) months., Disp: , Rfl:    metoprolol  succinate (TOPROL -XL) 25 MG 24 hr tablet, TAKE 1 TABLET BY MOUTH DAILY, Disp: 90 tablet, Rfl: 3   olmesartan  (BENICAR ) 20 MG tablet, Take 1 tablet (20 mg total) by mouth daily., Disp: 90 tablet, Rfl: 2   pantoprazole  (PROTONIX ) 40 MG tablet, Take 40 mg by mouth every evening., Disp: , Rfl:   Allergies:  Allergies  Allergen Reactions   Shellfish Allergy Hives, Shortness Of Breath and Swelling    Crab meat and lobster.  Can tolerate small doses of shrimp.    Effexor [Venlafaxine] Other (See Comments)    Sleepy/hyper   Other Other (See Comments)   Penicillins Other (See Comments)    As a child. But recently tolerated Augmentin . Has patient had a PCN reaction causing immediate rash, facial/tongue/throat swelling, SOB or lightheadedness with hypotension: Unknown Has patient had a PCN reaction causing severe rash involving mucus membranes or skin necrosis: Unknown Has patient had a PCN reaction that required hospitalization: Yes Has patient had a PCN reaction occurring within  the last 10 years: No If all of the above answers are NO, then may proceed with Cephalosporin use.    Shellfish-Derived Products Other (See Comments)   Venlafaxine Hcl Other (See Comments)    Past Medical History, Surgical history, Social history, and Family History were reviewed and updated.  Review of Systems: Review of Systems  Constitutional:  Positive for appetite change and fatigue.  HENT:  Negative.    Eyes: Negative.   Respiratory: Negative.    Cardiovascular: Negative.   Gastrointestinal:  Positive for nausea.  Endocrine: Negative.   Genitourinary: Negative.    Musculoskeletal: Negative.   Skin: Negative.   Neurological: Negative.    Hematological: Negative.   Psychiatric/Behavioral: Negative.      Physical Exam:  height is 5' 1 (1.549 m) and weight is 115 lb (52.2 kg). Her oral temperature is 99 F (37.2 C). Her blood pressure is 113/69 and her pulse is 78. Her respiration is 18 and oxygen saturation is 99%.   Wt Readings from Last 3 Encounters:  08/18/24 115 lb (52.2 kg)  08/04/24 116 lb 4.8 oz (52.8 kg)  04/14/24 121 lb 3.2 oz (55 kg)    Physical Exam Vitals reviewed.  HENT:     Head: Normocephalic and atraumatic.  Eyes:     Pupils: Pupils are equal, round, and reactive to light.  Cardiovascular:     Rate and Rhythm: Normal rate and regular rhythm.     Heart sounds: Normal heart sounds.  Pulmonary:     Effort: Pulmonary effort is normal.     Breath sounds: Normal breath sounds.  Abdominal:     General: Bowel sounds are normal.     Palpations: Abdomen is soft.  Musculoskeletal:        General: No tenderness or deformity. Normal range of motion.     Cervical back: Normal range of motion.  Lymphadenopathy:     Cervical: No cervical adenopathy.  Skin:    General: Skin is warm and dry.     Findings: No erythema or rash.  Neurological:     Mental Status: She is alert and oriented to person, place, and time.  Psychiatric:        Behavior: Behavior normal.        Thought Content: Thought content normal.        Judgment: Judgment normal.      Lab Results  Component Value Date   WBC 10.3 08/18/2024   HGB 11.5 (L) 08/18/2024   HCT 33.2 (L) 08/18/2024   MCV 88.3 08/18/2024   PLT 241 08/18/2024     Chemistry      Component Value Date/Time   NA 133 (L) 08/18/2024 1107   NA 139 12/29/2018 1220   K 5.1 08/18/2024 1107   CL 95 (L) 08/18/2024 1107   CO2 24 08/18/2024 1107   BUN 6 (L) 08/18/2024 1107   BUN 10 12/29/2018 1220   CREATININE 0.82 08/18/2024 1107   CREATININE 0.65 04/15/2016 0841      Component Value Date/Time   CALCIUM  9.3 08/18/2024 1107   ALKPHOS 41 08/18/2024 1107   AST 34  08/18/2024 1107   ALT 19 08/18/2024 1107   BILITOT 0.8 08/18/2024 1107      Impression and Plan: Brandy Meyer is a very charming 68 year old white female.  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  Again, I do not know if there is anything that we might be missing with respect to adenopathy within her.  I do think it be worthwhile getting a CT scan.  We will see about getting this set up in a couple weeks.  She and her husband will be going out of town.  I want him to be able to enjoy their travels.  Again, I told her that I doubt that we would ever would have to treat her.  We will see what her immunoglobulin status is..    I did send off a CLL prognostic panel on her.  This may help us  out.  For right now, we will have to see when we need to get her back.  Hopefully, we can hold on getting her back maybe 2 or 3 months.   Maude JONELLE Crease, MD 9/19/20251:59 PM

## 2024-08-18 NOTE — Progress Notes (Signed)
 Initial RN Navigator Patient Visit  Name: Brandy Meyer Date of Referral : 08/10/2024 Diagnosis: CLL  Met with patient prior to their visit with MD. [name] patient Your Patient Navigator handout which explains my role, areas in which I am able to help, and all the contact information for myself and the office. Also gave patient MD and Navigator business card. Reviewed with patient the general overview of expected course after initial diagnosis and time frame for all steps to be completed.  New patient packet given to patient which includes: orientation to office and staff; campus directory; education on My Chart and Advance Directives; and patient centered education on CLL  Her husband was recently diagnosed with lymphoma and is treated in this office. As such, she defers on referrals to social work and nutrition.   Patient completed visit with Dr. Timmy. Once office note dictated and orders placed, will schedule. Patient is out of town until next Thursday. Will schedule scans at that time.   Patient understands all follow up procedures and expectations. They have my number to reach out for any further clarification or additional needs.  Oncology Nurse Navigator Documentation     08/18/2024   11:00 AM  Oncology Nurse Navigator Flowsheets  Navigator Follow Up Date: 08/24/2024  Navigator Follow Up Reason: Radiology  Navigator Location [facility]  Navigator Encounter Type Initial MedOnc  Patient Visit Type MedOnc  Treatment Phase Pre-Tx/Tx Discussion  Barriers/Navigation Needs Coordination of Care;Education  Education Pain/ Symptom Management;Newly Diagnosed Cancer Education  Interventions Education;Psycho-Social Support  Acuity Level 2-Minimal Needs (1-2 Barriers Identified)  Education Method Verbal;Written  Support Groups/Services Friends and Family  Time Spent with Patient 30

## 2024-08-19 LAB — IGG, IGA, IGM
IgA: 228 mg/dL (ref 87–352)
IgG (Immunoglobin G), Serum: 711 mg/dL (ref 586–1602)
IgM (Immunoglobulin M), Srm: 39 mg/dL (ref 26–217)

## 2024-08-21 LAB — KAPPA/LAMBDA LIGHT CHAINS
Kappa free light chain: 17.8 mg/L (ref 3.3–19.4)
Kappa, lambda light chain ratio: 0.94 (ref 0.26–1.65)
Lambda free light chains: 18.9 mg/L (ref 5.7–26.3)

## 2024-08-21 LAB — FOLATE RBC
Folate, Hemolysate: 214 ng/mL
Folate, RBC: UNDETERMINED ng/mL

## 2024-08-21 LAB — HEMATOLOGY COMMENTS:

## 2024-08-24 ENCOUNTER — Encounter: Payer: Self-pay | Admitting: *Deleted

## 2024-08-24 LAB — IMMUNOFIXATION REFLEX, SERUM
IgA: 248 mg/dL (ref 87–352)
IgG (Immunoglobin G), Serum: 744 mg/dL (ref 586–1602)
IgM (Immunoglobulin M), Srm: 47 mg/dL (ref 26–217)

## 2024-08-24 LAB — PROTEIN ELECTROPHORESIS, SERUM, WITH REFLEX
A/G Ratio: 1.5 (ref 0.7–1.7)
Albumin ELP: 3.7 g/dL (ref 2.9–4.4)
Alpha-1-Globulin: 0.2 g/dL (ref 0.0–0.4)
Alpha-2-Globulin: 0.7 g/dL (ref 0.4–1.0)
Beta Globulin: 0.9 g/dL (ref 0.7–1.3)
Gamma Globulin: 0.6 g/dL (ref 0.4–1.8)
Globulin, Total: 2.4 g/dL (ref 2.2–3.9)
SPEP Interpretation: 0
Total Protein ELP: 6.1 g/dL (ref 6.0–8.5)

## 2024-08-24 NOTE — Progress Notes (Signed)
 CTs scheduled for 08/29/2024  Oncology Nurse Navigator Documentation     08/24/2024    2:30 PM  Oncology Nurse Navigator Flowsheets  Navigator Follow Up Date: 08/29/2024  Navigator Follow Up Reason: Scan Review  Navigator Location [facility]  Navigator Encounter Type Appt/Treatment Plan Review  Patient Visit Type MedOnc  Treatment Phase Pre-Tx/Tx Discussion  Barriers/Navigation Needs Coordination of Care;Education  Interventions None Required  Acuity Level 2-Minimal Needs (1-2 Barriers Identified)  Support Groups/Services Friends and Family  Time Spent with Patient 15

## 2024-08-25 LAB — FISH HES LEUKEMIA, 4Q12 REA

## 2024-08-29 ENCOUNTER — Ambulatory Visit (HOSPITAL_BASED_OUTPATIENT_CLINIC_OR_DEPARTMENT_OTHER)
Admission: RE | Admit: 2024-08-29 | Discharge: 2024-08-29 | Disposition: A | Discharge status: Home / Self Care | Source: Ambulatory Visit | Attending: Hematology & Oncology | Admitting: Hematology & Oncology

## 2024-08-29 ENCOUNTER — Ambulatory Visit: Payer: Self-pay | Admitting: Hematology & Oncology

## 2024-08-29 ENCOUNTER — Ambulatory Visit (HOSPITAL_BASED_OUTPATIENT_CLINIC_OR_DEPARTMENT_OTHER)
Admission: RE | Admit: 2024-08-29 | Discharge: 2024-08-29 | Disposition: A | Source: Ambulatory Visit | Attending: Hematology & Oncology | Admitting: Hematology & Oncology

## 2024-08-29 ENCOUNTER — Encounter (HOSPITAL_BASED_OUTPATIENT_CLINIC_OR_DEPARTMENT_OTHER): Payer: Self-pay

## 2024-08-29 DIAGNOSIS — I2510 Atherosclerotic heart disease of native coronary artery without angina pectoris: Secondary | ICD-10-CM | POA: Insufficient documentation

## 2024-08-29 DIAGNOSIS — I700 Atherosclerosis of aorta: Secondary | ICD-10-CM | POA: Insufficient documentation

## 2024-08-29 DIAGNOSIS — J8410 Pulmonary fibrosis, unspecified: Secondary | ICD-10-CM | POA: Insufficient documentation

## 2024-08-29 DIAGNOSIS — K760 Fatty (change of) liver, not elsewhere classified: Secondary | ICD-10-CM | POA: Diagnosis not present

## 2024-08-29 DIAGNOSIS — D72820 Lymphocytosis (symptomatic): Secondary | ICD-10-CM | POA: Insufficient documentation

## 2024-08-29 DIAGNOSIS — K5730 Diverticulosis of large intestine without perforation or abscess without bleeding: Secondary | ICD-10-CM | POA: Insufficient documentation

## 2024-08-29 DIAGNOSIS — Z0389 Encounter for observation for other suspected diseases and conditions ruled out: Secondary | ICD-10-CM | POA: Diagnosis not present

## 2024-08-29 MED ORDER — IOHEXOL 300 MG/ML  SOLN
100.0000 mL | Freq: Once | INTRAMUSCULAR | Status: AC | PRN
  Administered 2024-08-29: 100 mL via INTRAVENOUS

## 2024-08-30 ENCOUNTER — Encounter: Payer: Self-pay | Admitting: *Deleted

## 2024-08-30 NOTE — Progress Notes (Signed)
 Result Note Call - the CT scan does NOT show any enlarged lymph nodes!!!  Brandy Meyer  Patient called and notified of results. She is very appreciative of information.   Oncology Nurse Navigator Documentation     08/30/2024    9:45 AM  Oncology Nurse Navigator Flowsheets  Navigator Follow Up Date: 10/20/2024  Navigator Follow Up Reason: Follow-up Appointment  Navigator Location [facility]  Navigator Encounter Type Telephone;Diagnostic Results  Telephone Education;Outgoing Call  Patient Visit Type MedOnc  Treatment Phase Other  Barriers/Navigation Needs Education  Education Other  Interventions Education  Acuity Level 2-Minimal Needs (1-2 Barriers Identified)  Education Method Verbal  Time Spent with Patient 15

## 2024-08-31 ENCOUNTER — Other Ambulatory Visit: Payer: Self-pay | Admitting: Pharmacist

## 2024-08-31 ENCOUNTER — Encounter: Payer: Self-pay | Admitting: Pharmacist

## 2024-08-31 DIAGNOSIS — E785 Hyperlipidemia, unspecified: Secondary | ICD-10-CM

## 2024-09-06 LAB — LIPID PANEL

## 2024-09-07 LAB — COMPREHENSIVE METABOLIC PANEL WITH GFR
ALT: 33 IU/L — ABNORMAL HIGH (ref 0–32)
AST: 49 IU/L — ABNORMAL HIGH (ref 0–40)
Albumin: 4.5 g/dL (ref 3.9–4.9)
Alkaline Phosphatase: 55 IU/L (ref 49–135)
BUN/Creatinine Ratio: 6 — ABNORMAL LOW (ref 12–28)
BUN: 5 mg/dL — ABNORMAL LOW (ref 8–27)
Bilirubin Total: 1 mg/dL (ref 0.0–1.2)
CO2: 21 mmol/L (ref 20–29)
Calcium: 9.3 mg/dL (ref 8.7–10.3)
Chloride: 88 mmol/L — ABNORMAL LOW (ref 96–106)
Creatinine, Ser: 0.87 mg/dL (ref 0.57–1.00)
Globulin, Total: 2.3 g/dL (ref 1.5–4.5)
Glucose: 74 mg/dL (ref 70–99)
Potassium: 5.2 mmol/L (ref 3.5–5.2)
Sodium: 129 mmol/L — ABNORMAL LOW (ref 134–144)
Total Protein: 6.8 g/dL (ref 6.0–8.5)
eGFR: 73 mL/min/1.73 (ref >59)

## 2024-09-07 LAB — LIPID PANEL
Cholesterol, Total: 168 mg/dL (ref 100–199)
HDL: 130 mg/dL (ref >39)
LDL CALC COMMENT:: 1.3 ratio (ref 0.0–4.4)
LDL Chol Calc (NIH): 27 mg/dL (ref 0–99)
Triglycerides: 55 mg/dL (ref 0–149)
VLDL Cholesterol Cal: 11 mg/dL (ref 5–40)

## 2024-09-07 LAB — URIC ACID: Uric Acid: 6.6 mg/dL (ref 3.0–7.2)

## 2024-09-10 ENCOUNTER — Ambulatory Visit: Payer: Self-pay | Admitting: Pharmacist

## 2024-09-10 DIAGNOSIS — E785 Hyperlipidemia, unspecified: Secondary | ICD-10-CM

## 2024-10-20 ENCOUNTER — Other Ambulatory Visit: Payer: Self-pay | Admitting: *Deleted

## 2024-10-20 ENCOUNTER — Inpatient Hospital Stay: Admitting: Hematology & Oncology

## 2024-10-20 ENCOUNTER — Inpatient Hospital Stay

## 2024-10-20 ENCOUNTER — Inpatient Hospital Stay: Attending: Hematology and Oncology

## 2024-10-20 ENCOUNTER — Encounter: Payer: Self-pay | Admitting: Hematology & Oncology

## 2024-10-20 VITALS — BP 101/57 | HR 77 | Temp 97.8°F | Resp 20 | Ht 61.0 in | Wt 108.1 lb

## 2024-10-20 DIAGNOSIS — E860 Dehydration: Secondary | ICD-10-CM | POA: Diagnosis not present

## 2024-10-20 DIAGNOSIS — E039 Hypothyroidism, unspecified: Secondary | ICD-10-CM

## 2024-10-20 DIAGNOSIS — D72820 Lymphocytosis (symptomatic): Secondary | ICD-10-CM | POA: Insufficient documentation

## 2024-10-20 DIAGNOSIS — D72829 Elevated white blood cell count, unspecified: Secondary | ICD-10-CM | POA: Diagnosis not present

## 2024-10-20 LAB — CBC WITH DIFFERENTIAL (CANCER CENTER ONLY)
Abs Immature Granulocytes: 0.02 K/uL (ref 0.00–0.07)
Basophils Absolute: 0.1 K/uL (ref 0.0–0.1)
Basophils Relative: 1 %
Eosinophils Absolute: 0.1 K/uL (ref 0.0–0.5)
Eosinophils Relative: 1 %
HCT: 33.2 % — ABNORMAL LOW (ref 36.0–46.0)
Hemoglobin: 11.8 g/dL — ABNORMAL LOW (ref 12.0–15.0)
Immature Granulocytes: 0 %
Lymphocytes Relative: 43 %
Lymphs Abs: 5.7 K/uL — ABNORMAL HIGH (ref 0.7–4.0)
MCH: 32.7 pg (ref 26.0–34.0)
MCHC: 35.5 g/dL (ref 30.0–36.0)
MCV: 92 fL (ref 80.0–100.0)
Monocytes Absolute: 0.6 K/uL (ref 0.1–1.0)
Monocytes Relative: 4 %
Neutro Abs: 6.7 K/uL (ref 1.7–7.7)
Neutrophils Relative %: 51 %
Platelet Count: 433 K/uL — ABNORMAL HIGH (ref 150–400)
RBC: 3.61 MIL/uL — ABNORMAL LOW (ref 3.87–5.11)
RDW: 12.7 % (ref 11.5–15.5)
WBC Count: 13.2 K/uL — ABNORMAL HIGH (ref 4.0–10.5)
nRBC: 0 % (ref 0.0–0.2)

## 2024-10-20 LAB — CMP (CANCER CENTER ONLY)
ALT: 60 U/L — ABNORMAL HIGH (ref 0–44)
AST: 67 U/L — ABNORMAL HIGH (ref 15–41)
Albumin: 4.3 g/dL (ref 3.5–5.0)
Alkaline Phosphatase: 45 U/L (ref 38–126)
Anion gap: 16 — ABNORMAL HIGH (ref 5–15)
BUN: 9 mg/dL (ref 8–23)
CO2: 20 mmol/L — ABNORMAL LOW (ref 22–32)
Calcium: 9.3 mg/dL (ref 8.9–10.3)
Chloride: 90 mmol/L — ABNORMAL LOW (ref 98–111)
Creatinine: 0.81 mg/dL (ref 0.44–1.00)
GFR, Estimated: >60 mL/min (ref >60)
Glucose, Bld: 66 mg/dL — ABNORMAL LOW (ref 70–99)
Potassium: 4.7 mmol/L (ref 3.5–5.1)
Sodium: 126 mmol/L — ABNORMAL LOW (ref 135–145)
Total Bilirubin: 0.9 mg/dL (ref 0.0–1.2)
Total Protein: 6.9 g/dL (ref 6.5–8.1)

## 2024-10-20 LAB — TSH: TSH: 0.436 u[IU]/mL (ref 0.350–4.500)

## 2024-10-20 LAB — LACTATE DEHYDROGENASE: LDH: 241 U/L — ABNORMAL HIGH (ref 105–235)

## 2024-10-20 MED ORDER — METHYLPREDNISOLONE SODIUM SUCC 125 MG IJ SOLR
60.0000 mg | Freq: Once | INTRAMUSCULAR | Status: AC
  Administered 2024-10-20: 60 mg via INTRAVENOUS
  Filled 2024-10-20: qty 2

## 2024-10-20 MED ORDER — SODIUM CHLORIDE 0.9 % IV SOLN: Freq: Once | INTRAVENOUS | Status: AC

## 2024-10-20 NOTE — Patient Instructions (Signed)
 Dehydration, Adult Dehydration is a condition in which there is not enough water or other fluids in the body. This happens when a person loses more fluids than they take in. Important organs cannot work right without the right amount of fluids. Any loss of fluids from the body can cause dehydration. Dehydration can be mild, worse, or very bad. It should be treated right away to keep it from getting very bad. What are the causes? Conditions that cause loss of water in the body. They include: Watery poop (diarrhea). Vomiting. Sweating a lot. Fever. Infection. Peeing (urinating) a lot. Not drinking enough fluids. Certain medicines, such as medicines that take extra fluid out of the body (diuretics). Lack of safe drinking water. Not being able to get enough water and food. What increases the risk? Having a long-term (chronic) illness that has not been treated the right way, such as: Diabetes. Heart disease. Kidney disease. Being 25 years of age or older. Having a disability. Living in a place that is high above the ground or sea (high in altitude). The thinner, drier air causes more fluid loss. Doing exercises that put stress on your body for a long time. Being active when in hot places. What are the signs or symptoms? Symptoms of dehydration depend on how bad it is. Mild or worse dehydration Thirst. Dry lips or dry mouth. Feeling dizzy or light-headed. Muscle cramps. Passing little pee or dark pee. Pee may be the color of tea. Headache. Very bad dehydration Changes in skin. Skin may: Be cold to the touch (clammy). Be blotchy or pale. Not go back to normal right after you pinch it and let it go. Little or no tears, pee, or sweat. Fast breathing. Low blood pressure. Weak pulse. Pulse that is more than 100 beats a minute when you are sitting still. Other changes, such as: Feeling very thirsty. Eyes that look hollow (sunken). Cold hands and feet. Being confused. Being very  tired (lethargic) or having trouble waking from sleep. Losing weight. Loss of consciousness. How is this treated? Treatment for this condition depends on how bad your dehydration is. Treatment should start right away. Do not wait until your condition gets very bad. Very bad dehydration is an emergency. You will need to go to a hospital. Mild or worse dehydration can be treated at home. You may be asked to: Drink more fluids. Drink an oral rehydration solution (ORS). This drink gives you the right amount of fluids, salts, and minerals (electrolytes). Very bad dehydration can be treated: With fluids through an IV tube. By correcting low levels of electrolytes in the body. By treating the problem that caused your dehydration. Follow these instructions at home: Oral rehydration solution If told by your doctor, drink an ORS: Make an ORS. Use instructions on the package. Start by drinking small amounts, about  cup (120 mL) every 5-10 minutes. Slowly drink more until you have had the amount that your doctor said to have.  Eating and drinking  Drink enough clear fluid to keep your pee pale yellow. If you were told to drink an ORS, finish the ORS first. Then, start slowly drinking other clear fluids. Drink fluids such as: Water. Do not drink only water. Doing that can make the salt (sodium) level in your body get too low. Water from ice chips you suck on. Fruit juice that you have added water to (diluted). Low-calorie sports drinks. Eat foods that have the right amounts of salts and minerals, such as bananas, oranges, potatoes,  tomatoes, or spinach. Do not drink alcohol. Avoid drinks that have caffeine or sugar. These include:: High-calorie sports drinks. Fruit juice that you did not add water to. Soda. Coffee or energy drinks. Avoid foods that are greasy or have a lot of fat or sugar. General instructions Take over-the-counter and prescription medicines only as told by your doctor. Do  not take sodium tablets. Doing that can make the salt level in your body get too high. Return to your normal activities as told by your doctor. Ask your doctor what activities are safe for you. Keep all follow-up visits. Your doctor may check and change your treatment. Contact a doctor if: You have pain in your belly (abdomen) and the pain: Gets worse. Stays in one place. You have a rash. You have a stiff neck. You get angry or annoyed more easily than normal. You are more tired or have a harder time waking than normal. You feel weak or dizzy. You feel very thirsty. Get help right away if: You have any symptoms of very bad dehydration. You vomit every time you eat or drink. Your vomiting gets worse, does not go away, or you vomit blood or green stuff. You are getting treatment, but symptoms are getting worse. You have a fever. You have a very bad headache. You have: Diarrhea that gets worse or does not go away. Blood in your poop (stool). This may cause poop to look black and tarry. No pee in 6-8 hours. Only a small amount of pee in 6-8 hours, and the pee is very dark. You have trouble breathing. These symptoms may be an emergency. Get help right away. Call 911. Do not wait to see if the symptoms will go away. Do not drive yourself to the hospital. This information is not intended to replace advice given to you by your health care provider. Make sure you discuss any questions you have with your health care provider. Document Revised: 06/15/2022 Document Reviewed: 06/15/2022 Elsevier Patient Education  2024 [employer].

## 2024-10-20 NOTE — Progress Notes (Signed)
 Hematology and Oncology Follow Up Visit  Brandy Meyer [id number] 05-11-1956 68 y.o. 10/20/2024   Principle Diagnosis:  Monoclonal B-cell lymphocytosis  Current Therapy:   Observation     Interim History:  Brandy Meyer is in for follow-up.  Clearly, something is not right.  She just looks very weak.  She says that she is not eating.  She says she has no taste.  She says it has been going on for 2-1/2 years.  I am unsure exactly how this has been evaluated.  It does not sound like she has been to a S[facility].  She is incredibly dehydrated.  Her labs/electrolytes/show that she is dehydrated.  She just seems to have a little bit of disorientation.  I really think she is going to have a MRI of the brain.  When we last saw her, we did do scans on her.  The scans were done on 08/29/2024.  The scans and did not show any evidence of lymphadenopathy.  There is no splenomegaly.  She does have hepatic steatosis.  She has mild pulmonary fibrosis.  Nothing that really looks malignant.  We did do a CLL panel.  This was all normal.  Right now, she does not have CLL.  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  However, she just is not doing as well as I would have thought.  I am not sure what else might be going on..  She definitely needs IV fluids.  I would probably give her a little bit of IV steroid.  Bev her blood pressure is on the low side.  I told her to stop the Benicar . \  I will check a TSH on her.  She does have hypothyroidism.  I am unsure with some of the medicines that she is on.  I know she takes it for cholesterol.  She has had no problems with incontinence.  She has had no diarrhea.  She has had no cough.  She has had no mouth sores.  Of note, when we saw her, her IgG level was 730 mg/dL.  IgA level was 235 mg/dL.  Overall, I will say that her performance status is probably ECOG 2.   Medications:  Current Outpatient Medications:    aspirin  EC 81 MG tablet, Take 1  tablet (81 mg total) by mouth daily., Disp: 90 tablet, Rfl: 3   Carboxymethylcellulose Sodium (REFRESH TEARS OP), Place 1 drop into both eyes 2 (two) times daily as needed (dry eyes)., Disp: , Rfl:    fluticasone  (FLONASE ) 50 MCG/ACT nasal spray, Place 2 sprays into both nostrils daily as needed for allergies., Disp: , Rfl:    inclisiran (LEQVIO ) 284 MG/1.5ML SOSY injection, Inject 1.5 mLs (284 mg total) into the skin every 6 (six) months., Disp: , Rfl:    metoprolol  succinate (TOPROL -XL) 25 MG 24 hr tablet, TAKE 1 TABLET BY MOUTH DAILY, Disp: 90 tablet, Rfl: 3   olmesartan  (BENICAR ) 20 MG tablet, Take 1 tablet (20 mg total) by mouth daily., Disp: 90 tablet, Rfl: 2   pantoprazole  (PROTONIX ) 40 MG tablet, Take 40 mg by mouth every evening., Disp: , Rfl:    Bempedoic Acid -Ezetimibe  (NEXLIZET ) 180-10 MG TABS, Take 1 tablet by mouth daily., Disp: 90 tablet, Rfl: 3   ezetimibe  (ZETIA ) 10 MG tablet, Take 10 mg by mouth daily., Disp: , Rfl:    levothyroxine  (SYNTHROID ) 50 MCG tablet, Take 50 mcg by mouth daily., Disp: , Rfl:   Allergies:  Allergies  Allergen Reactions  Shellfish Allergy Hives, Shortness Of Breath and Swelling    Crab meat and lobster.  Can tolerate small doses of shrimp.    Statins Other (See Comments)    Muscles not working.   Effexor [Venlafaxine] Other (See Comments)    Sleepy/hyper   Other Other (See Comments)   Penicillins Other (See Comments)    As a child. But recently tolerated Augmentin . Has patient had a PCN reaction causing immediate rash, facial/tongue/throat swelling, SOB or lightheadedness with hypotension: Unknown Has patient had a PCN reaction causing severe rash involving mucus membranes or skin necrosis: Unknown Has patient had a PCN reaction that required hospitalization: Yes Has patient had a PCN reaction occurring within the last 10 years: No If all of the above answers are NO, then may proceed with Cephalosporin use.    Shellfish Protein-Containing  Drug Products Other (See Comments)   Venlafaxine Hcl Other (See Comments)    Past Medical History, Surgical history, Social history, and Family History were reviewed and updated.  Review of Systems: Review of Systems  Constitutional:  Positive for appetite change and fatigue.  HENT:  Negative.    Eyes: Negative.   Respiratory: Negative.    Cardiovascular: Negative.   Gastrointestinal:  Positive for nausea.  Endocrine: Negative.   Genitourinary: Negative.    Musculoskeletal: Negative.   Skin: Negative.   Neurological: Negative.   Hematological: Negative.   Psychiatric/Behavioral: Negative.      Physical Exam:  height is 5' 1 (1.549 m) and weight is 108 lb 1.9 oz (49 kg). Her oral temperature is 97.8 F (36.6 C). Her blood pressure is 101/57 (abnormal) and her pulse is 77. Her respiration is 20 and oxygen saturation is 96%.   Wt Readings from Last 3 Encounters:  10/20/24 108 lb 1.9 oz (49 kg)  08/18/24 115 lb (52.2 kg)  08/04/24 116 lb 4.8 oz (52.8 kg)    Physical Exam Vitals reviewed.  HENT:     Head: Normocephalic and atraumatic.  Eyes:     Pupils: Pupils are equal, round, and reactive to light.  Cardiovascular:     Rate and Rhythm: Normal rate and regular rhythm.     Heart sounds: Normal heart sounds.  Pulmonary:     Effort: Pulmonary effort is normal.     Breath sounds: Normal breath sounds.  Abdominal:     General: Bowel sounds are normal.     Palpations: Abdomen is soft.     Comments: Abdominal exam is soft.  She has decent bowel sounds.  There is no fluid wave.  There is no palpable liver or spleen tip.  Musculoskeletal:        General: No tenderness or deformity. Normal range of motion.     Cervical back: Normal range of motion.  Lymphadenopathy:     Cervical: No cervical adenopathy.  Skin:    General: Skin is warm and dry.     Findings: No erythema or rash.  Neurological:     Mental Status: She is alert and oriented to person, place, and time.      Comments: She does have a little bit of forgetfulness.  She does have a little bit of a shuffling with the feet.  She does seems a little bit unsteady.  Psychiatric:        Behavior: Behavior normal.        Thought Content: Thought content normal.        Judgment: Judgment normal.      Lab Results  Component Value Date   WBC 13.2 (H) 10/20/2024   HGB 11.8 (L) 10/20/2024   HCT 33.2 (L) 10/20/2024   MCV 92.0 10/20/2024   PLT 433 (H) 10/20/2024     Chemistry      Component Value Date/Time   NA 129 (L) 09/06/2024 1128   K 5.2 09/06/2024 1128   CL 88 (L) 09/06/2024 1128   CO2 21 09/06/2024 1128   BUN 5 (L) 09/06/2024 1128   CREATININE 0.87 09/06/2024 1128   CREATININE 0.82 08/18/2024 1107   CREATININE 0.65 04/15/2016 0841      Component Value Date/Time   CALCIUM  9.3 09/06/2024 1128   ALKPHOS 55 09/06/2024 1128   AST 49 (H) 09/06/2024 1128   AST 34 08/18/2024 1107   ALT 33 (H) 09/06/2024 1128   ALT 19 08/18/2024 1107   BILITOT 1.0 09/06/2024 1128   BILITOT 0.8 08/18/2024 1107      Impression and Plan: Ms. Bressi is a very charming 68 year old white female.  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    Again I do not see any evidence of a hematologic malignancy.  I do worry that some else going on.  I do not think it would be a would be a bad idea to get a MRI of the brain.  Again I just want to make sure we are not missing anything.  She will need IV fluids today.  I will also give her some IV steroid.  Hopefully, she will go back to her family doctor to have any further testing.  We will plan to get her back to see her in probably 3 to 4 weeks.      Brandy JONELLE Crease, MD 11/21/202511:03 AM

## 2024-10-21 LAB — BETA 2 MICROGLOBULIN, SERUM: Beta-2 Microglobulin: 2.6 mg/L — ABNORMAL HIGH (ref 0.6–2.4)

## 2024-10-23 ENCOUNTER — Encounter: Payer: Self-pay | Admitting: *Deleted

## 2024-10-24 ENCOUNTER — Encounter: Payer: Self-pay | Admitting: *Deleted

## 2024-10-24 NOTE — Progress Notes (Signed)
 Patient came in for follow up. She was feeling poorly with dehydration and difficulty with eating. She states this is chronic. She is not under any treatment right now in regards to her precursor CLL. As a precaution, Dr Timmy would like her to have an MRI. She requires an open MRI and cannot have this scheduled until 12/30.   Oncology Nurse Navigator Documentation     10/24/2024   12:30 PM  Oncology Nurse Navigator Flowsheets  Navigator Follow Up Date: 11/16/2024  Navigator Follow Up Reason: Follow-up Appointment  Navigator Location [facility]  Navigator Encounter Type Appt/Treatment Plan Review  Patient Visit Type MedOnc  Treatment Phase Other  Barriers/Navigation Needs No Barriers At This Time  Interventions None Required  Acuity Level 1-No Barriers  Support Groups/Services Friends and Family  Time Spent with Patient 15

## 2024-11-02 ENCOUNTER — Telehealth: Payer: Self-pay | Admitting: Dietician

## 2024-11-02 NOTE — Telephone Encounter (Signed)
 Patient returned call very tearful.  She wasn't ready to talk today.  Has busy schedule with spouses treatments, but set up appointment for remote consult next week.  Micheline Craven, RDN, LDN Registered Dietitian, [facility] Part Time Remote (Usual office hours: Tuesday-Thursday) Mobile: [phone] Remote Office: [phone]

## 2024-11-02 NOTE — Telephone Encounter (Signed)
 Patient screened on MST. First attempt to reach. Provided my cell# on voice mail to return call to set up a nutrition consult.  Micheline Craven, RDN, LDN Registered Dietitian, [facility] Part Time Remote (Usual office hours: Tuesday-Thursday) Cell: [phone]

## 2024-11-07 ENCOUNTER — Other Ambulatory Visit (HOSPITAL_COMMUNITY): Payer: Self-pay

## 2024-11-07 ENCOUNTER — Encounter: Payer: Self-pay | Admitting: Hematology & Oncology

## 2024-11-07 ENCOUNTER — Encounter: Payer: Self-pay | Admitting: Pharmacist

## 2024-11-08 ENCOUNTER — Other Ambulatory Visit (HOSPITAL_COMMUNITY): Payer: Self-pay

## 2024-11-08 ENCOUNTER — Other Ambulatory Visit: Payer: Self-pay

## 2024-11-09 ENCOUNTER — Other Ambulatory Visit (HOSPITAL_COMMUNITY): Payer: Self-pay

## 2024-11-09 ENCOUNTER — Inpatient Hospital Stay: Attending: Hematology and Oncology | Admitting: Dietician

## 2024-11-09 DIAGNOSIS — D72820 Lymphocytosis (symptomatic): Secondary | ICD-10-CM | POA: Insufficient documentation

## 2024-11-09 NOTE — Progress Notes (Signed)
 Nutrition Assessment:   Reached out to patient at her home#.  Patient is a 68 year old female who transferred care to Dr. Timmy after work up of Lymphocytosis  by Dr. Loretha.  Spouse also treated by Dr. Timmy.  Patient screen on MST for weight loss.  PMHx includes AKI, HT, Hypothyroidism, HLD, and Osteoarthritis.  Last seen by Dr. Timmy treated for dehydration.  MRI sched 12/30.  Patient relays can't taste foods other than ice cream and pop sickle.  Sweets work well. Yesterday had Greek chicken skewer, 3 bites spit rest out. Consistency is the main reason she doesn't tolerate some foods.  Describes chicken growing in her mouth as she chews and she has to get it out. SABRA Soups sometimes go down, tomato soup works well, doesn't tolerate the chicken bits in chicken noodle Frosted flakes and ice cream work.  Wiling to try Total cereal fro added vitamins.  Hands and legs shake sometimes and she then has trouble standing, prepping foods, and feeding herself.  She bought a dark chocolate THC chewy but hasn't tried it yet. Fluids: 8-10 cup of ice water , 1/2 glass wine at night, Ensure Max Pro at least one a day. Has a Liquid IV but hasn't tried it yet. Not taking any vitamin supplements has trouble with pills. Willing to look for liquid MVI.  Nutrition Focused Physical Exam: unable to perform NFPE   Medications: Synthroid  and Protonix   Labs: 10/20/24  Na 126, Cl 90 Glucose 66, Hgb 11.8    Anthropometrics:  13# (11%) weight loss past 6 months  Height: 61 Weight: 108# UBW: 125-130# BMI: 20.43   Estimated Energy Needs  Kcals: 1500-1700 Protein: 59-74 g Fluid: 2 L   NUTRITION DIAGNOSIS: Inadequate PO intake to meet EEN r/t taste, and tolerance issues  INTERVENTION:   Relayed that nutrition services are wrap around service provided at no charge and encouraged continued communication if experiencing any nutritional impact symptoms (NIS). Educated on importance of adequate nourishment  with calorie and protein energy intake with nutrient dense foods when possible, to maintain weight/strength and QOL.   Encouraged protein pacing and frequent feeds  Encouraged liquid multivitamin and Thiamine 100mg  pill for 2 weeks may be at risk for refeeding syndrome.  Small meals and electrolyte drinks every 1-2 hours.   Emailed sample small meals, recipe for high protein milk, recipe for chocolate peanut butter pudding, shakes.  Encouraged switch to Ensure Complete and to ask for samples at cancer center and coupons.    MONITORING, EVALUATION, GOAL: weight trends, nutrition impact symptoms, PO intake, labs  Next Visit:  Micheline Craven, RDN, LDN Registered Dietitian, [facility] Part Time Remote (Usual office hours: Tuesday-Thursday) Mobile: [phone]

## 2024-11-10 ENCOUNTER — Other Ambulatory Visit (HOSPITAL_COMMUNITY): Payer: Self-pay

## 2024-11-13 ENCOUNTER — Other Ambulatory Visit (HOSPITAL_COMMUNITY): Payer: Self-pay

## 2024-11-13 ENCOUNTER — Other Ambulatory Visit: Payer: Self-pay

## 2024-11-16 ENCOUNTER — Other Ambulatory Visit: Payer: Self-pay

## 2024-11-16 ENCOUNTER — Encounter: Payer: Self-pay | Admitting: *Deleted

## 2024-11-16 ENCOUNTER — Inpatient Hospital Stay: Admitting: Dietician

## 2024-11-16 ENCOUNTER — Inpatient Hospital Stay

## 2024-11-16 ENCOUNTER — Inpatient Hospital Stay (HOSPITAL_BASED_OUTPATIENT_CLINIC_OR_DEPARTMENT_OTHER): Admitting: Hematology & Oncology

## 2024-11-16 VITALS — BP 115/75 | HR 78 | Temp 98.1°F | Resp 17 | Ht 61.0 in | Wt 107.0 lb

## 2024-11-16 DIAGNOSIS — D72820 Lymphocytosis (symptomatic): Secondary | ICD-10-CM | POA: Insufficient documentation

## 2024-11-16 DIAGNOSIS — E039 Hypothyroidism, unspecified: Secondary | ICD-10-CM

## 2024-11-16 DIAGNOSIS — D72829 Elevated white blood cell count, unspecified: Secondary | ICD-10-CM

## 2024-11-16 LAB — CBC WITH DIFFERENTIAL (CANCER CENTER ONLY)
Abs Immature Granulocytes: 0.01 K/uL (ref 0.00–0.07)
Basophils Absolute: 0.1 K/uL (ref 0.0–0.1)
Basophils Relative: 1 %
Eosinophils Absolute: 0.2 K/uL (ref 0.0–0.5)
Eosinophils Relative: 2 %
HCT: 35.2 % — ABNORMAL LOW (ref 36.0–46.0)
Hemoglobin: 12.4 g/dL (ref 12.0–15.0)
Immature Granulocytes: 0 %
Lymphocytes Relative: 59 %
Lymphs Abs: 5.4 K/uL — ABNORMAL HIGH (ref 0.7–4.0)
MCH: 33.8 pg (ref 26.0–34.0)
MCHC: 35.2 g/dL (ref 30.0–36.0)
MCV: 95.9 fL (ref 80.0–100.0)
Monocytes Absolute: 0.6 K/uL (ref 0.1–1.0)
Monocytes Relative: 7 %
Neutro Abs: 2.9 K/uL (ref 1.7–7.7)
Neutrophils Relative %: 31 %
Platelet Count: 253 K/uL (ref 150–400)
RBC: 3.67 MIL/uL — ABNORMAL LOW (ref 3.87–5.11)
RDW: 13.5 % (ref 11.5–15.5)
WBC Count: 9.1 K/uL (ref 4.0–10.5)
nRBC: 0 % (ref 0.0–0.2)

## 2024-11-16 LAB — CMP (CANCER CENTER ONLY)
ALT: 31 U/L (ref 0–44)
AST: 37 U/L (ref 15–41)
Albumin: 4.4 g/dL (ref 3.5–5.0)
Alkaline Phosphatase: 46 U/L (ref 38–126)
Anion gap: 15 (ref 5–15)
BUN: 6 mg/dL — ABNORMAL LOW (ref 8–23)
CO2: 26 mmol/L (ref 22–32)
Calcium: 9.2 mg/dL (ref 8.9–10.3)
Chloride: 97 mmol/L — ABNORMAL LOW (ref 98–111)
Creatinine: 0.66 mg/dL (ref 0.44–1.00)
GFR, Estimated: >60 mL/min (ref >60)
Glucose, Bld: 84 mg/dL (ref 70–99)
Potassium: 4 mmol/L (ref 3.5–5.1)
Sodium: 138 mmol/L (ref 135–145)
Total Bilirubin: 1.2 mg/dL (ref 0.0–1.2)
Total Protein: 6.8 g/dL (ref 6.5–8.1)

## 2024-11-16 LAB — MAGNESIUM: Magnesium: 1.2 mg/dL — ABNORMAL LOW (ref 1.7–2.4)

## 2024-11-16 LAB — SAVE SMEAR(SSMR), FOR PROVIDER SLIDE REVIEW

## 2024-11-16 NOTE — Progress Notes (Signed)
 Patient is doing much better today from her last visit. She doesn't require any symptom management today.   Oncology Nurse Navigator Documentation     11/16/2024   10:15 AM  Oncology Nurse Navigator Flowsheets  Navigator Follow Up Date: 11/28/2024  Navigator Follow Up Reason: Scan Review  Navigator Location [facility]  Navigator Encounter Type Follow-up Appt  Patient Visit Type MedOnc  Treatment Phase Other  Barriers/Navigation Needs No Barriers At This Time  Interventions Psycho-Social Support  Acuity Level 1-No Barriers  Support Groups/Services Friends and Family  Time Spent with Patient 15

## 2024-11-16 NOTE — Progress Notes (Signed)
 Nutrition Assessment:   Reached out to patient at her home#.  Patient tearfully grateful for advice.  Feeling so much better.  No nausea, no shaking. She did get some Ensure complete samples with coupons at [facility]. Has not yet tried smoothie recipes but did start with powder milk mixed with milk. Went out yesterday ordered [employer] insurance didn't sit well but able was to eat the green beans, Been drinking her liquid IV. Also tried pudding with dry milk powder added for more protein. V-8 juice, and mixing her Total cereal with Frosted flakes. Hasn't found the liquid MVI or started on Thiamine yet.  Mg Low on labs but had diarrhea for 2 days prior.   Medications: Reviewed  Labs: 11/16/24  Mg 1.2   Anthropometrics:  13# (11%) weight loss past 6 months  Height: 61 Weight:  11/16/24  107# 10/20/24 108#108# UBW: 125-130# BMI: 20.22   Estimated Energy Needs  Kcals: 1500-1700 Protein: 59-74 g Fluid: 2 L   NUTRITION DIAGNOSIS: Inadequate PO intake to meet EEN r/t taste, and tolerance issues. Ongoing  INTERVENTION:    Encouraged continued frequent feeds, soft and liquids if more comfortable.  Encouraged liquid multivitamin and Thiamine 100mg  pill for 2 weeks may be at risk for refeeding syndrome.  Small meals and electrolyte drinks every 1-2 hours.   Reviewed Mag sources in foods. Emailed tip sheet  Encouraged if tolerated Ensure Complete BID   MONITORING, EVALUATION, GOAL: weight trends, nutrition impact symptoms, PO intake, labs  Next Visit:  remote next month   Micheline Craven, RDN, LDN Registered Dietitian, [facility] Part Time Remote (Usual office hours: Tuesday-Thursday) Mobile: [phone]

## 2024-11-16 NOTE — Progress Notes (Signed)
 Hematology and Oncology Follow Up Visit  Brandy Meyer [id number] 11-18-1956 68 y.o. 11/16/2024   Principle Diagnosis:  Monoclonal B-cell lymphocytosis  Current Therapy:   Observation     Interim History:  Brandy Meyer is in for follow-up.  She is doing whole better than when we last saw her.  I am so happy for her.  When we last saw her, she was dehydrated.  She is not eating well.  She just looked very disheveled.  She is working with a nutritionist to try to get her appetite better.  She has had no problems with nausea or vomiting.  She has had no problems with bowels or bladder.  There has been no diarrhea.  She has had no leg swelling.  She has had no rashes.  I think that she is going to have an MRI of the brain at the end of the month.  She has had no fever.  Overall, I would say that her performance status is ECOG 1.   Medications:  Current Outpatient Medications:    aspirin  EC 81 MG tablet, Take 1 tablet (81 mg total) by mouth daily., Disp: 90 tablet, Rfl: 3   Bempedoic Acid -Ezetimibe  (NEXLIZET ) 180-10 MG TABS, Take 1 tablet by mouth daily., Disp: 90 tablet, Rfl: 3   Carboxymethylcellulose Sodium (REFRESH TEARS OP), Place 1 drop into both eyes 2 (two) times daily as needed (dry eyes)., Disp: , Rfl:    ezetimibe  (ZETIA ) 10 MG tablet, Take 10 mg by mouth daily., Disp: , Rfl:    fluticasone  (FLONASE ) 50 MCG/ACT nasal spray, Place 2 sprays into both nostrils daily as needed for allergies., Disp: , Rfl:    inclisiran (LEQVIO ) 284 MG/1.5ML SOSY injection, Inject 1.5 mLs (284 mg total) into the skin every 6 (six) months., Disp: , Rfl:    levothyroxine  (SYNTHROID ) 50 MCG tablet, Take 50 mcg by mouth daily., Disp: , Rfl:    metoprolol  succinate (TOPROL -XL) 25 MG 24 hr tablet, TAKE 1 TABLET BY MOUTH DAILY, Disp: 90 tablet, Rfl: 3   pantoprazole  (PROTONIX ) 40 MG tablet, Take 40 mg by mouth every evening., Disp: , Rfl:   Allergies:  Allergies  Allergen Reactions   Shellfish  Allergy Hives, Shortness Of Breath and Swelling    Crab meat and lobster.  Can tolerate small doses of shrimp.    Statins Other (See Comments)    Muscles not working.   Effexor [Venlafaxine] Other (See Comments)    Sleepy/hyper   Other Other (See Comments)   Penicillins Other (See Comments)    As a child. But recently tolerated Augmentin . Has patient had a PCN reaction causing immediate rash, facial/tongue/throat swelling, SOB or lightheadedness with hypotension: Unknown Has patient had a PCN reaction causing severe rash involving mucus membranes or skin necrosis: Unknown Has patient had a PCN reaction that required hospitalization: Yes Has patient had a PCN reaction occurring within the last 10 years: No If all of the above answers are NO, then may proceed with Cephalosporin use.    Shellfish Protein-Containing Drug Products Other (See Comments)   Venlafaxine Hcl Other (See Comments)    Past Medical History, Surgical history, Social history, and Family History were reviewed and updated.  Review of Systems: Review of Systems  Constitutional:  Positive for appetite change and fatigue.  HENT:  Negative.    Eyes: Negative.   Respiratory: Negative.    Cardiovascular: Negative.   Gastrointestinal:  Positive for nausea.  Endocrine: Negative.   Genitourinary: Negative.  Musculoskeletal: Negative.   Skin: Negative.   Neurological: Negative.   Hematological: Negative.   Psychiatric/Behavioral: Negative.      Physical Exam:  height is 5' 1 (1.549 m) and weight is 107 lb (48.5 kg). Her oral temperature is 98.1 F (36.7 C). Her blood pressure is 115/75 and her pulse is 78. Her respiration is 17 and oxygen saturation is 99%.   Wt Readings from Last 3 Encounters:  11/16/24 107 lb (48.5 kg)  10/20/24 108 lb 1.9 oz (49 kg)  08/18/24 115 lb (52.2 kg)    Physical Exam Vitals reviewed.  HENT:     Head: Normocephalic and atraumatic.  Eyes:     Pupils: Pupils are equal, round,  and reactive to light.  Cardiovascular:     Rate and Rhythm: Normal rate and regular rhythm.     Heart sounds: Normal heart sounds.  Pulmonary:     Effort: Pulmonary effort is normal.     Breath sounds: Normal breath sounds.  Abdominal:     General: Bowel sounds are normal.     Palpations: Abdomen is soft.     Comments: Abdominal exam is soft.  She has decent bowel sounds.  There is no fluid wave.  There is no palpable liver or spleen tip.  Musculoskeletal:        General: No tenderness or deformity. Normal range of motion.     Cervical back: Normal range of motion.  Lymphadenopathy:     Cervical: No cervical adenopathy.  Skin:    General: Skin is warm and dry.     Findings: No erythema or rash.  Neurological:     Mental Status: She is alert and oriented to person, place, and time.     Comments: She does have a little bit of forgetfulness.  She does have a little bit of a shuffling with the feet.  She does seems a little bit unsteady.  Psychiatric:        Behavior: Behavior normal.        Thought Content: Thought content normal.        Judgment: Judgment normal.      Lab Results  Component Value Date   WBC 9.1 11/16/2024   HGB 12.4 11/16/2024   HCT 35.2 (L) 11/16/2024   MCV 95.9 11/16/2024   PLT 253 11/16/2024     Chemistry      Component Value Date/Time   NA 138 11/16/2024 0937   NA 129 (L) 09/06/2024 1128   K 4.0 11/16/2024 0937   CL 97 (L) 11/16/2024 0937   CO2 26 11/16/2024 0937   BUN 6 (L) 11/16/2024 0937   BUN 5 (L) 09/06/2024 1128   CREATININE 0.66 11/16/2024 0937   CREATININE 0.65 04/15/2016 0841      Component Value Date/Time   CALCIUM  9.2 11/16/2024 0937   ALKPHOS 46 11/16/2024 0937   AST 37 11/16/2024 0937   ALT 31 11/16/2024 0937   BILITOT 1.2 11/16/2024 0937      Impression and Plan: Brandy Meyer is a very charming 68 year old white female.  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    Again I do not see any evidence of a  hematologic malignancy.  I am so happy that she is better.  I know that she will clearly enjoy her holidays now.  We will I do see what the MRI shows.  I think we can get her back to see us  now in a couple of months.  Maude JONELLE Crease, MD 12/18/202510:34 AM

## 2024-11-28 ENCOUNTER — Encounter (HOSPITAL_COMMUNITY): Payer: Self-pay | Admitting: Interventional Cardiology

## 2024-11-28 ENCOUNTER — Ambulatory Visit
Admission: RE | Admit: 2024-11-28 | Discharge: 2024-11-28 | Disposition: A | Discharge status: Home / Self Care | Source: Ambulatory Visit | Attending: Hematology & Oncology | Admitting: Hematology & Oncology

## 2024-11-28 DIAGNOSIS — D72829 Elevated white blood cell count, unspecified: Secondary | ICD-10-CM

## 2024-11-28 MED ORDER — GADOPICLENOL 0.5 MMOL/ML IV SOLN
5.0000 mL | Freq: Once | INTRAVENOUS | Status: AC | PRN
  Administered 2024-11-28: 5 mL via INTRAVENOUS

## 2024-12-05 ENCOUNTER — Encounter (HOSPITAL_COMMUNITY): Payer: Self-pay | Admitting: Interventional Cardiology

## 2024-12-08 ENCOUNTER — Telehealth (HOSPITAL_COMMUNITY): Payer: Self-pay | Admitting: Pharmacy Technician

## 2024-12-08 NOTE — Telephone Encounter (Signed)
 Auth Submission: APPROVED Site of care: [facility] Payer: [employer] MEDICARE Medication & CPT/J Code(s) submitted: Leqvio  (Inclisiran) J1306 Diagnosis Code: E78.5 Route of submission (phone, fax, portal): CMM KEY:  Phone # Fax # Auth type: Buy/Bill HB Units/visits requested: 284MG  EVERY 6 MONTHS Reference number: [id number] AUTH# [id number] Approval from: 12/07/24 to 11/29/25    Dagoberto Armour, CPhT [facility] Phone: [phone] 12/08/2024

## 2024-12-11 ENCOUNTER — Encounter: Payer: Self-pay | Admitting: *Deleted

## 2024-12-11 ENCOUNTER — Ambulatory Visit: Payer: Self-pay | Admitting: Hematology & Oncology

## 2024-12-11 NOTE — Progress Notes (Signed)
 Reviewed MRI which was negative for any malignant process.  Oncology Nurse Navigator Documentation     12/11/2024    7:30 AM  Oncology Nurse Navigator Flowsheets  Navigator Follow Up Date: 01/18/2025  Navigator Follow Up Reason: Follow-up Appointment  Navigator Location [facility]  Navigator Encounter Type Scan Review  Patient Visit Type MedOnc  Treatment Phase Other  Barriers/Navigation Needs No Barriers At This Time  Interventions Psycho-Social Support  Acuity Level 1-No Barriers  Support Groups/Services Friends and Family  Time Spent with Patient 15

## 2024-12-22 ENCOUNTER — Encounter (HOSPITAL_COMMUNITY): Payer: Self-pay | Admitting: Interventional Cardiology

## 2024-12-29 ENCOUNTER — Ambulatory Visit (HOSPITAL_COMMUNITY)
Admission: RE | Admit: 2024-12-29 | Discharge: 2024-12-29 | Disposition: A | Source: Ambulatory Visit | Attending: Cardiology

## 2024-12-29 VITALS — BP 137/78 | HR 90 | Temp 97.2°F | Resp 18

## 2024-12-29 DIAGNOSIS — E785 Hyperlipidemia, unspecified: Secondary | ICD-10-CM | POA: Insufficient documentation

## 2024-12-29 MED ORDER — INCLISIRAN SODIUM 284 MG/1.5ML ~~LOC~~ SOSY
PREFILLED_SYRINGE | SUBCUTANEOUS | Status: AC
  Filled 2024-12-29: qty 1.5

## 2024-12-29 MED ORDER — INCLISIRAN SODIUM 284 MG/1.5ML ~~LOC~~ SOSY
284.0000 mg | PREFILLED_SYRINGE | Freq: Once | SUBCUTANEOUS | Status: AC
  Administered 2024-12-29: 284 mg via SUBCUTANEOUS

## 2025-01-18 ENCOUNTER — Inpatient Hospital Stay

## 2025-01-18 ENCOUNTER — Inpatient Hospital Stay: Admitting: Hematology & Oncology

## 2025-01-18 ENCOUNTER — Inpatient Hospital Stay: Attending: Hematology and Oncology

## 2025-06-29 ENCOUNTER — Encounter (HOSPITAL_COMMUNITY)
